# Patient Record
Sex: Male | Born: 1940 | Race: White | Hispanic: No | Marital: Married | Smoking: Never smoker
Health system: Southern US, Community
[De-identification: ages and names within clinical notes are randomized; demographics above are authoritative.]

## PROBLEM LIST (undated history)

## (undated) DIAGNOSIS — F0390 Unspecified dementia without behavioral disturbance: Secondary | ICD-10-CM

## (undated) DIAGNOSIS — R0602 Shortness of breath: Secondary | ICD-10-CM

## (undated) DIAGNOSIS — Z86718 Personal history of other venous thrombosis and embolism: Secondary | ICD-10-CM

## (undated) DIAGNOSIS — I4891 Unspecified atrial fibrillation: Secondary | ICD-10-CM

## (undated) DIAGNOSIS — K219 Gastro-esophageal reflux disease without esophagitis: Secondary | ICD-10-CM

## (undated) HISTORY — PX: HIP SURGERY: SHX245

## (undated) HISTORY — PX: APPENDECTOMY: SHX54

## (undated) HISTORY — PX: WRIST SURGERY: SHX841

## (undated) HISTORY — PX: KNEE SURGERY: SHX244

## (undated) HISTORY — PX: TONSILLECTOMY: SUR1361

## (undated) HISTORY — PX: HERNIA REPAIR: SHX51

---

## 2004-08-24 ENCOUNTER — Encounter: Admission: RE | Admit: 2004-08-24 | Discharge: 2004-08-24 | Payer: Self-pay | Admitting: Gastroenterology

## 2009-03-15 ENCOUNTER — Emergency Department (HOSPITAL_COMMUNITY)
Admission: EM | Admit: 2009-03-15 | Discharge: 2009-03-15 | Payer: Self-pay | Source: Home / Self Care | Admitting: Emergency Medicine

## 2009-03-16 ENCOUNTER — Encounter: Admission: RE | Admit: 2009-03-16 | Discharge: 2009-03-16 | Payer: Self-pay | Admitting: Orthopaedic Surgery

## 2009-03-19 ENCOUNTER — Encounter (INDEPENDENT_AMBULATORY_CARE_PROVIDER_SITE_OTHER): Payer: Self-pay | Admitting: Orthopaedic Surgery

## 2009-03-19 ENCOUNTER — Inpatient Hospital Stay (HOSPITAL_COMMUNITY): Admission: RE | Admit: 2009-03-19 | Discharge: 2009-03-20 | Payer: Self-pay | Admitting: Orthopaedic Surgery

## 2009-05-19 ENCOUNTER — Inpatient Hospital Stay (HOSPITAL_COMMUNITY): Admission: RE | Admit: 2009-05-19 | Discharge: 2009-05-22 | Payer: Self-pay | Admitting: Orthopaedic Surgery

## 2009-05-20 ENCOUNTER — Ambulatory Visit: Payer: Self-pay | Admitting: Internal Medicine

## 2009-06-25 ENCOUNTER — Encounter: Admission: RE | Admit: 2009-06-25 | Discharge: 2009-06-25 | Payer: Self-pay | Admitting: Orthopaedic Surgery

## 2009-07-05 ENCOUNTER — Encounter: Admission: RE | Admit: 2009-07-05 | Discharge: 2009-07-05 | Payer: Self-pay | Admitting: Gastroenterology

## 2009-09-16 ENCOUNTER — Encounter: Admission: RE | Admit: 2009-09-16 | Discharge: 2009-09-16 | Payer: Self-pay | Admitting: Orthopaedic Surgery

## 2009-10-05 ENCOUNTER — Observation Stay (HOSPITAL_COMMUNITY): Admission: AD | Admit: 2009-10-05 | Discharge: 2009-10-08 | Payer: Self-pay | Admitting: Orthopaedic Surgery

## 2009-10-08 ENCOUNTER — Ambulatory Visit: Payer: Self-pay | Admitting: Physical Medicine & Rehabilitation

## 2009-10-21 ENCOUNTER — Encounter: Admission: RE | Admit: 2009-10-21 | Discharge: 2010-01-19 | Payer: Self-pay | Admitting: Orthopaedic Surgery

## 2009-11-08 ENCOUNTER — Encounter: Admission: RE | Admit: 2009-11-08 | Discharge: 2009-11-08 | Payer: Self-pay | Admitting: Orthopaedic Surgery

## 2009-11-30 ENCOUNTER — Encounter: Admission: RE | Admit: 2009-11-30 | Discharge: 2009-11-30 | Payer: Self-pay | Admitting: Orthopaedic Surgery

## 2009-12-01 ENCOUNTER — Telehealth (INDEPENDENT_AMBULATORY_CARE_PROVIDER_SITE_OTHER): Payer: Self-pay | Admitting: *Deleted

## 2009-12-07 ENCOUNTER — Telehealth (INDEPENDENT_AMBULATORY_CARE_PROVIDER_SITE_OTHER): Payer: Self-pay | Admitting: *Deleted

## 2009-12-27 ENCOUNTER — Ambulatory Visit: Payer: Self-pay | Admitting: Psychiatry

## 2010-03-24 ENCOUNTER — Ambulatory Visit
Admission: RE | Admit: 2010-03-24 | Discharge: 2010-03-24 | Payer: Self-pay | Source: Home / Self Care | Attending: Infectious Diseases | Admitting: Infectious Diseases

## 2010-03-24 ENCOUNTER — Encounter: Payer: Self-pay | Admitting: Infectious Diseases

## 2010-03-24 DIAGNOSIS — T8489XA Other specified complication of internal orthopedic prosthetic devices, implants and grafts, initial encounter: Secondary | ICD-10-CM | POA: Insufficient documentation

## 2010-03-29 NOTE — Progress Notes (Signed)
  Request from Gabrielle Dare sent to [facility] [place]  December 01, 2009 2:04 PM

## 2010-03-29 NOTE — Progress Notes (Signed)
  Phone Note Other Incoming   Request: Send information Summary of Call: Request for records received from [employer]. Request forwarded to [facility].
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
## 2010-04-26 NOTE — Miscellaneous (Signed)
Summary: HIPAA Restrictions  HIPAA Restrictions   Imported By: Florinda Marker 04/22/2010 10:11:47  _____________________________________________________________________  External Attachment:    Type:   Image     Comment:   External Document

## 2010-05-10 ENCOUNTER — Telehealth: Payer: Self-pay | Admitting: Infectious Diseases

## 2010-05-12 ENCOUNTER — Encounter: Payer: Self-pay | Admitting: Infectious Diseases

## 2010-05-12 ENCOUNTER — Ambulatory Visit (INDEPENDENT_AMBULATORY_CARE_PROVIDER_SITE_OTHER): Payer: BC Managed Care – PPO | Admitting: Infectious Diseases

## 2010-05-12 DIAGNOSIS — T8489XA Other specified complication of internal orthopedic prosthetic devices, implants and grafts, initial encounter: Secondary | ICD-10-CM

## 2010-05-13 LAB — CBC
HCT: 40.8 % (ref 39.0–52.0)
Hemoglobin: 14 g/dL (ref 13.0–17.0)
RBC: 4.74 MIL/uL (ref 4.22–5.81)
WBC: 11.3 10*3/uL — ABNORMAL HIGH (ref 4.0–10.5)

## 2010-05-13 LAB — VITAMIN B12: Vitamin B-12: 386 pg/mL (ref 211–911)

## 2010-05-13 LAB — PROTIME-INR
INR: 2.34 — ABNORMAL HIGH (ref 0.00–1.49)
INR: 2.84 — ABNORMAL HIGH (ref 0.00–1.49)
INR: 2.94 — ABNORMAL HIGH (ref 0.00–1.49)
Prothrombin Time: 25.8 seconds — ABNORMAL HIGH (ref 11.6–15.2)
Prothrombin Time: 30.7 seconds — ABNORMAL HIGH (ref 11.6–15.2)
Prothrombin Time: 31.7 seconds — ABNORMAL HIGH (ref 11.6–15.2)

## 2010-05-13 LAB — COMPREHENSIVE METABOLIC PANEL
ALT: 21 U/L (ref 0–53)
Alkaline Phosphatase: 158 U/L — ABNORMAL HIGH (ref 39–117)
CO2: 23 mEq/L (ref 19–32)
Chloride: 100 mEq/L (ref 96–112)
GFR calc non Af Amer: >60 mL/min (ref >60)
Glucose, Bld: 109 mg/dL — ABNORMAL HIGH (ref 70–99)
Potassium: 3.6 mEq/L (ref 3.5–5.1)
Sodium: 135 mEq/L (ref 135–145)

## 2010-05-16 LAB — POCT I-STAT, CHEM 8
BUN: 22 mg/dL (ref 6–23)
BUN: 22 mg/dL (ref 6–23)
Calcium, Ion: 1.09 mmol/L — ABNORMAL LOW (ref 1.12–1.32)
Calcium, Ion: 1.12 mmol/L (ref 1.12–1.32)
Chloride: 108 meq/L (ref 96–112)
Chloride: 109 mEq/L (ref 96–112)
Creatinine, Ser: 0.6 mg/dL (ref 0.4–1.5)
Creatinine, Ser: 0.6 mg/dL (ref 0.4–1.5)
Glucose, Bld: 121 mg/dL — ABNORMAL HIGH (ref 70–99)
Glucose, Bld: 129 mg/dL — ABNORMAL HIGH (ref 70–99)
HCT: 46 % (ref 39.0–52.0)
HCT: 46 % (ref 39.0–52.0)
Hemoglobin: 15.6 g/dL (ref 13.0–17.0)
Hemoglobin: 15.6 g/dL (ref 13.0–17.0)
Potassium: 3.5 mEq/L (ref 3.5–5.1)
Potassium: 3.8 meq/L (ref 3.5–5.1)
Sodium: 138 meq/L (ref 135–145)
Sodium: 139 meq/L (ref 135–145)
TCO2: 22 mmol/L (ref 0–100)
TCO2: 23 mmol/L (ref 0–100)

## 2010-05-16 LAB — URINALYSIS, ROUTINE W REFLEX MICROSCOPIC
Bilirubin Urine: NEGATIVE
Glucose, UA: NEGATIVE mg/dL
Hgb urine dipstick: NEGATIVE
Specific Gravity, Urine: 1.022 (ref 1.005–1.030)

## 2010-05-16 LAB — CBC
HCT: 38.3 % — ABNORMAL LOW (ref 39.0–52.0)
Platelets: 199 10*3/uL (ref 150–400)
RDW: 13.1 % (ref 11.5–15.5)
WBC: 13.2 10*3/uL — ABNORMAL HIGH (ref 4.0–10.5)

## 2010-05-16 LAB — CROSSMATCH
ABO/RH(D): B POS
Antibody Screen: NEGATIVE

## 2010-05-16 LAB — SAMPLE TO BLOOD BANK

## 2010-05-16 LAB — COMPREHENSIVE METABOLIC PANEL
AST: 59 U/L — ABNORMAL HIGH (ref 0–37)
Albumin: 3.8 g/dL (ref 3.5–5.2)
Alkaline Phosphatase: 67 U/L (ref 39–117)
BUN: 25 mg/dL — ABNORMAL HIGH (ref 6–23)
Chloride: 100 mEq/L (ref 96–112)
Creatinine, Ser: 0.83 mg/dL (ref 0.4–1.5)
GFR calc Af Amer: >60 mL/min (ref >60)
Potassium: 3.9 mEq/L (ref 3.5–5.1)
Total Protein: 6.1 g/dL (ref 6.0–8.3)

## 2010-05-16 LAB — ETHANOL: Alcohol, Ethyl (B): <5 mg/dL (ref 0–10)

## 2010-05-16 LAB — ABO/RH: ABO/RH(D): B POS

## 2010-05-16 LAB — HEMOGLOBIN AND HEMATOCRIT, BLOOD
HCT: 29.2 % — ABNORMAL LOW (ref 39.0–52.0)
Hemoglobin: 10.5 g/dL — ABNORMAL LOW (ref 13.0–17.0)

## 2010-05-17 NOTE — Assessment & Plan Note (Signed)
Summary: 56month f/u   Vital Signs:  Patient profile:   70 year old male Height:      72 inches (182.88 cm) Weight:      211 pounds (95.91 kg) BMI:     28.72 Temp:     97.6 degrees F (36.44 degrees C) oral Pulse rate:   108 / minute BP sitting:   136 / 81  (left arm)  Vitals Entered By: Jennet Maduro RN (May 12, 2010 12:01 PM) CC: follow-up visit Is Patient Diabetic? No Pain Assessment Patient in pain? no      Nutritional Status BMI of 25 - 29 = overweight Nutritional Status Detail appetite "excellent"  Have you ever been in a relationship where you felt threatened, hurt or afraid?not asked, wife present   Does patient need assistance? Functional Status Self care Ambulation Wheelchair Comments uses walker in the home   CC:  follow-up visit.  History of Present Illness: Ricky Rodriguez is here in followup of management of his left hip persistent MRSA infection. See previous note for the long history of his left hip infection. He essentially has a Physiological scientist after destructive infection of left hip for which he was treated for 3 months. There was apparent clinically reoslution of the infection and 6-7 weeks ago had a surveillance aspirate and culture or the left hip space. No pus was evident cultures grew MRSA which was suceptible to quinolones. After conferring with Meidinger wife we decided to treat him withprolonged course of moxifloxacin 400mg  daily for at least three months. He has completed first month without incident. He has never had pain but he has felft generally better and gaine about 15 lbs. His [facility] orhtopedist, Dr. Ronn Melena, as I recall, called me and Mrs Walkowiak and felt that his planned debridement and spacer placement would be necessary for successful mangagement and felt that antibiotic Rx alone of any duration would not be curtive. Mrs. Flahive did not want to proceed with surgery because of the great exacerbation of Michaels dementia with anesthesia and  glady acccepted a medical approach. He is  without pain and is doing well so far. A recent sed rate in his primary care office was 69mm/hr and I have drawn a CRP today. Will plan to treat for 3 full more months through May, 2012. Patient and wife are agreeable. Will share my notes with Dr. Ophelia Charter.  Preventive Screening-Counseling & Management  Alcohol-Tobacco     Alcohol drinks/day: <1     Alcohol type: wine     Smoking Status: never  Caffeine-Diet-Exercise     Caffeine use/day: no     Does Patient Exercise: no     Type of exercise: using walker in the house  Safety-Violence-Falls     Seat Belt Use: yes  Allergies: No Known Drug Allergies  Physical Exam  General:  alert, well-developed, and well-nourished.   Neck:  No deformities, masses, or tenderness noted. Lungs:  Normal respiratory effort, chest expands symmetrically. Lungs are clear to auscultation, no crackles or wheezes. Extremities:  Left leg is slightly flexed on hip but has no discomfor on lmited rotation and extension.   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 Patient Level III (63875) Continue moxifloxacin 400mg  by mouth daily for at least 3 moremonths. CRP peneding.  Patient Instructions: 1)  Please schedule a follow-up appointment in 2 weeks.   Orders Added: 1)  T-C-Reactive Protein [[id number]] 2)  Est. Patient Level III [id number]

## 2010-05-17 NOTE — Progress Notes (Signed)
  Phone Note Call from Patient   Caller: Spouse Summary of Call: she left message that her spouse was seeing another md & getting labs. did not want to duplicate labs. we have no future orders in chart. I called back & LM asking her to have the other md fax the results here (gave her our fax #) the md here will not duplicate if he has had the labs we need Initial call taken by: Golden Circle RN,  May 10, 2010 11:37 AM  Follow-up for Phone Call        wife called back. pt gets pt/inr done & other md doses the coumadin. told her we do not need to have those sent here. they did a ded rate & she will have that sent here. Dr. Maurice March will presumably order other labs at visit this week Follow-up by: Golden Circle RN,  May 10, 2010 2:10 PM

## 2010-05-23 LAB — ANAEROBIC CULTURE

## 2010-05-23 LAB — TISSUE CULTURE: Culture: NO GROWTH

## 2010-05-23 LAB — BASIC METABOLIC PANEL
CO2: 26 mEq/L (ref 19–32)
Calcium: 7.9 mg/dL — ABNORMAL LOW (ref 8.4–10.5)
Creatinine, Ser: 0.76 mg/dL (ref 0.4–1.5)
GFR calc Af Amer: >60 mL/min (ref >60)
GFR calc non Af Amer: >60 mL/min (ref >60)
Glucose, Bld: 121 mg/dL — ABNORMAL HIGH (ref 70–99)
Sodium: 135 mEq/L (ref 135–145)

## 2010-05-23 LAB — COMPREHENSIVE METABOLIC PANEL
AST: 16 U/L (ref 0–37)
Albumin: 3.5 g/dL (ref 3.5–5.2)
CO2: 23 mEq/L (ref 19–32)
Calcium: 9.3 mg/dL (ref 8.4–10.5)
Creatinine, Ser: 0.76 mg/dL (ref 0.4–1.5)
GFR calc Af Amer: >60 mL/min (ref >60)
GFR calc non Af Amer: >60 mL/min (ref >60)
Total Protein: 6.9 g/dL (ref 6.0–8.3)

## 2010-05-23 LAB — URINALYSIS, ROUTINE W REFLEX MICROSCOPIC
Bilirubin Urine: NEGATIVE
Glucose, UA: NEGATIVE mg/dL
Nitrite: NEGATIVE
Specific Gravity, Urine: 1.018 (ref 1.005–1.030)
pH: 7.5 (ref 5.0–8.0)

## 2010-05-23 LAB — CBC
Hemoglobin: 8.7 g/dL — ABNORMAL LOW (ref 13.0–17.0)
MCHC: 32.9 g/dL (ref 30.0–36.0)
MCHC: 33.4 g/dL (ref 30.0–36.0)
MCV: 90.2 fL (ref 78.0–100.0)
Platelets: 274 10*3/uL (ref 150–400)
Platelets: 382 10*3/uL (ref 150–400)
RDW: 15.1 % (ref 11.5–15.5)
RDW: 15.1 % (ref 11.5–15.5)
RDW: 15.2 % (ref 11.5–15.5)
WBC: 7.3 10*3/uL (ref 4.0–10.5)

## 2010-05-23 LAB — PROTIME-INR
INR: 1.45 (ref 0.00–1.49)
INR: 1.76 — ABNORMAL HIGH (ref 0.00–1.49)
Prothrombin Time: 16.5 seconds — ABNORMAL HIGH (ref 11.6–15.2)
Prothrombin Time: 17.5 seconds — ABNORMAL HIGH (ref 11.6–15.2)
Prothrombin Time: 20.4 seconds — ABNORMAL HIGH (ref 11.6–15.2)
Prothrombin Time: 27.9 seconds — ABNORMAL HIGH (ref 11.6–15.2)

## 2010-05-23 LAB — VANCOMYCIN, TROUGH: Vancomycin Tr: 8.5 ug/mL — ABNORMAL LOW (ref 10.0–20.0)

## 2010-06-16 ENCOUNTER — Encounter: Payer: Self-pay | Admitting: Infectious Diseases

## 2010-06-16 ENCOUNTER — Ambulatory Visit (INDEPENDENT_AMBULATORY_CARE_PROVIDER_SITE_OTHER): Payer: BC Managed Care – PPO | Admitting: Infectious Diseases

## 2010-06-16 VITALS — BP 146/90 | HR 101 | Temp 97.8°F | Ht 72.0 in | Wt 220.0 lb

## 2010-06-16 DIAGNOSIS — M542 Cervicalgia: Secondary | ICD-10-CM

## 2010-06-16 MED ORDER — ALPRAZOLAM 0.5 MG PO TABS: 0.5000 mg | ORAL_TABLET | Freq: Three times a day (TID) | ORAL | Status: DC | PRN
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
## 2010-11-03 ENCOUNTER — Ambulatory Visit: Payer: BC Managed Care – PPO | Admitting: Infectious Diseases

## 2011-02-02 ENCOUNTER — Encounter (HOSPITAL_BASED_OUTPATIENT_CLINIC_OR_DEPARTMENT_OTHER): Payer: BC Managed Care – PPO | Attending: Internal Medicine

## 2011-02-02 DIAGNOSIS — Z79899 Other long term (current) drug therapy: Secondary | ICD-10-CM | POA: Insufficient documentation

## 2011-02-02 DIAGNOSIS — L89309 Pressure ulcer of unspecified buttock, unspecified stage: Secondary | ICD-10-CM | POA: Insufficient documentation

## 2011-02-02 DIAGNOSIS — I10 Essential (primary) hypertension: Secondary | ICD-10-CM | POA: Insufficient documentation

## 2011-02-02 DIAGNOSIS — M159 Polyosteoarthritis, unspecified: Secondary | ICD-10-CM | POA: Insufficient documentation

## 2011-02-02 DIAGNOSIS — K219 Gastro-esophageal reflux disease without esophagitis: Secondary | ICD-10-CM | POA: Insufficient documentation

## 2011-02-02 DIAGNOSIS — Z7901 Long term (current) use of anticoagulants: Secondary | ICD-10-CM | POA: Insufficient documentation

## 2011-02-02 DIAGNOSIS — L8990 Pressure ulcer of unspecified site, unspecified stage: Secondary | ICD-10-CM | POA: Insufficient documentation

## 2011-02-02 DIAGNOSIS — Z86718 Personal history of other venous thrombosis and embolism: Secondary | ICD-10-CM | POA: Insufficient documentation

## 2011-02-02 DIAGNOSIS — R259 Unspecified abnormal involuntary movements: Secondary | ICD-10-CM | POA: Insufficient documentation
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
## 2011-03-02 ENCOUNTER — Encounter (HOSPITAL_BASED_OUTPATIENT_CLINIC_OR_DEPARTMENT_OTHER): Payer: BC Managed Care – PPO | Attending: Internal Medicine

## 2011-03-02 DIAGNOSIS — L89309 Pressure ulcer of unspecified buttock, unspecified stage: Secondary | ICD-10-CM | POA: Insufficient documentation

## 2011-03-02 DIAGNOSIS — L8990 Pressure ulcer of unspecified site, unspecified stage: Secondary | ICD-10-CM | POA: Insufficient documentation

## 2011-03-02 DIAGNOSIS — Z7901 Long term (current) use of anticoagulants: Secondary | ICD-10-CM | POA: Insufficient documentation

## 2011-03-02 DIAGNOSIS — I10 Essential (primary) hypertension: Secondary | ICD-10-CM | POA: Insufficient documentation

## 2011-03-02 DIAGNOSIS — M159 Polyosteoarthritis, unspecified: Secondary | ICD-10-CM | POA: Insufficient documentation

## 2011-03-02 DIAGNOSIS — Z79899 Other long term (current) drug therapy: Secondary | ICD-10-CM | POA: Insufficient documentation

## 2011-03-02 DIAGNOSIS — R259 Unspecified abnormal involuntary movements: Secondary | ICD-10-CM | POA: Insufficient documentation

## 2011-03-02 DIAGNOSIS — K219 Gastro-esophageal reflux disease without esophagitis: Secondary | ICD-10-CM | POA: Insufficient documentation

## 2011-03-02 DIAGNOSIS — Z86718 Personal history of other venous thrombosis and embolism: Secondary | ICD-10-CM | POA: Insufficient documentation

## 2011-05-11 DIAGNOSIS — M1610 Unilateral primary osteoarthritis, unspecified hip: Secondary | ICD-10-CM | POA: Insufficient documentation

## 2011-05-19 DIAGNOSIS — M00859 Arthritis due to other bacteria, unspecified hip: Secondary | ICD-10-CM | POA: Insufficient documentation

## 2011-06-23 ENCOUNTER — Ambulatory Visit: Payer: BC Managed Care – PPO | Attending: Internal Medicine | Admitting: Physical Therapy

## 2011-06-23 DIAGNOSIS — IMO0001 Reserved for inherently not codable concepts without codable children: Secondary | ICD-10-CM | POA: Insufficient documentation

## 2011-06-23 DIAGNOSIS — M6281 Muscle weakness (generalized): Secondary | ICD-10-CM | POA: Insufficient documentation

## 2011-06-23 DIAGNOSIS — M25669 Stiffness of unspecified knee, not elsewhere classified: Secondary | ICD-10-CM | POA: Insufficient documentation

## 2011-06-23 DIAGNOSIS — R262 Difficulty in walking, not elsewhere classified: Secondary | ICD-10-CM | POA: Insufficient documentation

## 2011-06-27 ENCOUNTER — Ambulatory Visit: Payer: BC Managed Care – PPO | Admitting: Physical Therapy

## 2011-06-30 ENCOUNTER — Ambulatory Visit: Payer: BC Managed Care – PPO | Attending: Internal Medicine | Admitting: Physical Therapy

## 2011-06-30 DIAGNOSIS — IMO0001 Reserved for inherently not codable concepts without codable children: Secondary | ICD-10-CM | POA: Insufficient documentation

## 2011-06-30 DIAGNOSIS — M25669 Stiffness of unspecified knee, not elsewhere classified: Secondary | ICD-10-CM | POA: Insufficient documentation

## 2011-06-30 DIAGNOSIS — M6281 Muscle weakness (generalized): Secondary | ICD-10-CM | POA: Insufficient documentation

## 2011-06-30 DIAGNOSIS — R262 Difficulty in walking, not elsewhere classified: Secondary | ICD-10-CM | POA: Insufficient documentation

## 2011-07-03 ENCOUNTER — Ambulatory Visit: Payer: BC Managed Care – PPO | Admitting: Physical Therapy

## 2011-07-05 ENCOUNTER — Ambulatory Visit: Payer: BC Managed Care – PPO | Admitting: Physical Therapy

## 2011-07-10 ENCOUNTER — Ambulatory Visit: Payer: BC Managed Care – PPO | Admitting: Physical Therapy

## 2011-07-12 ENCOUNTER — Ambulatory Visit: Payer: BC Managed Care – PPO | Admitting: Physical Therapy

## 2011-07-17 ENCOUNTER — Ambulatory Visit: Payer: BC Managed Care – PPO | Admitting: Physical Therapy

## 2011-07-19 ENCOUNTER — Ambulatory Visit: Payer: BC Managed Care – PPO | Admitting: Physical Therapy

## 2011-07-25 ENCOUNTER — Ambulatory Visit: Payer: BC Managed Care – PPO | Admitting: Physical Therapy

## 2011-07-27 ENCOUNTER — Ambulatory Visit: Payer: BC Managed Care – PPO | Admitting: Physical Therapy

## 2011-07-31 ENCOUNTER — Ambulatory Visit: Payer: BC Managed Care – PPO | Attending: Internal Medicine | Admitting: Physical Therapy

## 2011-07-31 DIAGNOSIS — M25669 Stiffness of unspecified knee, not elsewhere classified: Secondary | ICD-10-CM | POA: Insufficient documentation

## 2011-07-31 DIAGNOSIS — IMO0001 Reserved for inherently not codable concepts without codable children: Secondary | ICD-10-CM | POA: Insufficient documentation

## 2011-07-31 DIAGNOSIS — M6281 Muscle weakness (generalized): Secondary | ICD-10-CM | POA: Insufficient documentation

## 2011-07-31 DIAGNOSIS — R262 Difficulty in walking, not elsewhere classified: Secondary | ICD-10-CM | POA: Insufficient documentation

## 2011-08-03 ENCOUNTER — Ambulatory Visit: Payer: BC Managed Care – PPO | Admitting: Physical Therapy

## 2011-08-07 ENCOUNTER — Ambulatory Visit: Payer: BC Managed Care – PPO | Admitting: Physical Therapy

## 2011-08-09 ENCOUNTER — Ambulatory Visit: Payer: BC Managed Care – PPO | Admitting: Physical Therapy

## 2011-08-16 ENCOUNTER — Ambulatory Visit: Payer: BC Managed Care – PPO | Admitting: Physical Therapy

## 2011-08-17 ENCOUNTER — Ambulatory Visit: Payer: BC Managed Care – PPO | Admitting: Physical Therapy

## 2011-08-24 ENCOUNTER — Ambulatory Visit: Payer: BC Managed Care – PPO | Admitting: Physical Therapy

## 2011-08-25 ENCOUNTER — Ambulatory Visit: Payer: BC Managed Care – PPO | Admitting: Physical Therapy

## 2011-08-29 ENCOUNTER — Ambulatory Visit: Payer: BC Managed Care – PPO | Attending: Internal Medicine | Admitting: Physical Therapy

## 2011-08-29 DIAGNOSIS — M25669 Stiffness of unspecified knee, not elsewhere classified: Secondary | ICD-10-CM | POA: Insufficient documentation

## 2011-08-29 DIAGNOSIS — M6281 Muscle weakness (generalized): Secondary | ICD-10-CM | POA: Insufficient documentation

## 2011-08-29 DIAGNOSIS — IMO0001 Reserved for inherently not codable concepts without codable children: Secondary | ICD-10-CM | POA: Insufficient documentation

## 2011-08-29 DIAGNOSIS — R262 Difficulty in walking, not elsewhere classified: Secondary | ICD-10-CM | POA: Insufficient documentation

## 2011-09-01 ENCOUNTER — Ambulatory Visit: Payer: BC Managed Care – PPO | Admitting: Physical Therapy

## 2011-09-05 ENCOUNTER — Ambulatory Visit: Payer: BC Managed Care – PPO | Admitting: Physical Therapy

## 2011-09-07 ENCOUNTER — Ambulatory Visit: Payer: BC Managed Care – PPO | Admitting: Physical Therapy

## 2011-09-15 ENCOUNTER — Ambulatory Visit: Payer: BC Managed Care – PPO | Admitting: Physical Therapy

## 2011-09-16 ENCOUNTER — Emergency Department (HOSPITAL_BASED_OUTPATIENT_CLINIC_OR_DEPARTMENT_OTHER): Payer: BC Managed Care – PPO

## 2011-09-16 ENCOUNTER — Inpatient Hospital Stay (HOSPITAL_BASED_OUTPATIENT_CLINIC_OR_DEPARTMENT_OTHER)
Admission: EM | Admit: 2011-09-16 | Discharge: 2011-09-26 | DRG: 558 | Disposition: A | Discharge status: Skilled Nursing Facility | Payer: BC Managed Care – PPO | Attending: Gastroenterology | Admitting: Gastroenterology

## 2011-09-16 ENCOUNTER — Encounter (HOSPITAL_BASED_OUTPATIENT_CLINIC_OR_DEPARTMENT_OTHER): Payer: Self-pay | Admitting: *Deleted

## 2011-09-16 DIAGNOSIS — Y831 Surgical operation with implant of artificial internal device as the cause of abnormal reaction of the patient, or of later complication, without mention of misadventure at the time of the procedure: Secondary | ICD-10-CM | POA: Diagnosis present

## 2011-09-16 DIAGNOSIS — G9341 Metabolic encephalopathy: Secondary | ICD-10-CM

## 2011-09-16 DIAGNOSIS — Z66 Do not resuscitate: Secondary | ICD-10-CM | POA: Diagnosis present

## 2011-09-16 DIAGNOSIS — I82509 Chronic embolism and thrombosis of unspecified deep veins of unspecified lower extremity: Secondary | ICD-10-CM | POA: Diagnosis present

## 2011-09-16 DIAGNOSIS — I10 Essential (primary) hypertension: Secondary | ICD-10-CM | POA: Diagnosis present

## 2011-09-16 DIAGNOSIS — I4891 Unspecified atrial fibrillation: Secondary | ICD-10-CM

## 2011-09-16 DIAGNOSIS — Y92009 Unspecified place in unspecified non-institutional (private) residence as the place of occurrence of the external cause: Secondary | ICD-10-CM

## 2011-09-16 DIAGNOSIS — E7800 Pure hypercholesterolemia, unspecified: Secondary | ICD-10-CM | POA: Diagnosis present

## 2011-09-16 DIAGNOSIS — I82403 Acute embolism and thrombosis of unspecified deep veins of lower extremity, bilateral: Secondary | ICD-10-CM | POA: Diagnosis present

## 2011-09-16 DIAGNOSIS — Z96649 Presence of unspecified artificial hip joint: Secondary | ICD-10-CM

## 2011-09-16 DIAGNOSIS — A419 Sepsis, unspecified organism: Secondary | ICD-10-CM | POA: Diagnosis present

## 2011-09-16 DIAGNOSIS — F0390 Unspecified dementia without behavioral disturbance: Secondary | ICD-10-CM | POA: Diagnosis present

## 2011-09-16 DIAGNOSIS — T8450XA Infection and inflammatory reaction due to unspecified internal joint prosthesis, initial encounter: Principal | ICD-10-CM | POA: Diagnosis present

## 2011-09-16 DIAGNOSIS — J189 Pneumonia, unspecified organism: Secondary | ICD-10-CM | POA: Diagnosis present

## 2011-09-16 DIAGNOSIS — E876 Hypokalemia: Secondary | ICD-10-CM

## 2011-09-16 DIAGNOSIS — I214 Non-ST elevation (NSTEMI) myocardial infarction: Secondary | ICD-10-CM

## 2011-09-16 DIAGNOSIS — Z7901 Long term (current) use of anticoagulants: Secondary | ICD-10-CM

## 2011-09-16 DIAGNOSIS — R509 Fever, unspecified: Secondary | ICD-10-CM

## 2011-09-16 DIAGNOSIS — T8452XA Infection and inflammatory reaction due to internal left hip prosthesis, initial encounter: Secondary | ICD-10-CM | POA: Diagnosis present

## 2011-09-16 DIAGNOSIS — K219 Gastro-esophageal reflux disease without esophagitis: Secondary | ICD-10-CM | POA: Diagnosis present

## 2011-09-16 HISTORY — DX: Gastro-esophageal reflux disease without esophagitis: K21.9

## 2011-09-16 HISTORY — DX: Unspecified dementia, unspecified severity, without behavioral disturbance, psychotic disturbance, mood disturbance, and anxiety: F03.90

## 2011-09-16 HISTORY — DX: Personal history of other venous thrombosis and embolism: Z86.718

## 2011-09-16 HISTORY — DX: Shortness of breath: R06.02

## 2011-09-16 HISTORY — DX: Unspecified atrial fibrillation: I48.91

## 2011-09-16 LAB — CBC WITH DIFFERENTIAL/PLATELET
Basophils Absolute: 0 10*3/uL (ref 0.0–0.1)
Eosinophils Absolute: 0 10*3/uL (ref 0.0–0.7)
Eosinophils Relative: 0 % (ref 0–5)
MCH: 29.7 pg (ref 26.0–34.0)
MCHC: 35.1 g/dL (ref 30.0–36.0)
MCV: 84.6 fL (ref 78.0–100.0)
Metamyelocytes Relative: 0 %
Myelocytes: 0 %
Platelets: 343 10*3/uL (ref 150–400)
Promyelocytes Absolute: 0 %
RBC: 4.75 MIL/uL (ref 4.22–5.81)
nRBC: 0 /100 WBC

## 2011-09-16 LAB — BASIC METABOLIC PANEL
BUN: 18 mg/dL (ref 6–23)
CO2: 22 mEq/L (ref 19–32)
Calcium: 9.1 mg/dL (ref 8.4–10.5)
Creatinine, Ser: 1 mg/dL (ref 0.50–1.35)
GFR calc non Af Amer: 74 mL/min — ABNORMAL LOW (ref >90)
Glucose, Bld: 194 mg/dL — ABNORMAL HIGH (ref 70–99)
Sodium: 132 mEq/L — ABNORMAL LOW (ref 135–145)

## 2011-09-16 LAB — URINALYSIS, ROUTINE W REFLEX MICROSCOPIC
Leukocytes, UA: NEGATIVE
Protein, ur: NEGATIVE mg/dL
Urobilinogen, UA: 0.2 mg/dL (ref 0.0–1.0)

## 2011-09-16 LAB — PROTIME-INR
INR: 2.2 — ABNORMAL HIGH (ref 0.00–1.49)
Prothrombin Time: 24.8 seconds — ABNORMAL HIGH (ref 11.6–15.2)

## 2011-09-16 LAB — MRSA PCR SCREENING: MRSA by PCR: NEGATIVE

## 2011-09-16 LAB — LACTIC ACID, PLASMA: Lactic Acid, Venous: 3.1 mmol/L — ABNORMAL HIGH (ref 0.5–2.2)

## 2011-09-16 MED ORDER — VANCOMYCIN HCL IN DEXTROSE 1-5 GM/200ML-% IV SOLN
1000.0000 mg | Freq: Once | INTRAVENOUS | Status: AC
  Administered 2011-09-16 (×2): 1000 mg via INTRAVENOUS
  Filled 2011-09-16: qty 200

## 2011-09-16 MED ORDER — IBUPROFEN 400 MG PO TABS
ORAL_TABLET | ORAL | Status: AC
  Administered 2011-09-16: 400 mg via ORAL
  Filled 2011-09-16: qty 1

## 2011-09-16 MED ORDER — B COMPLEX-C PO TABS
1.0000 | ORAL_TABLET | Freq: Every day | ORAL | Status: DC
  Administered 2011-09-17 – 2011-09-26 (×10): 1 via ORAL
  Filled 2011-09-16 (×11): qty 1

## 2011-09-16 MED ORDER — ARIPIPRAZOLE 2 MG PO TABS
2.0000 mg | ORAL_TABLET | Freq: Every day | ORAL | Status: DC
  Filled 2011-09-16: qty 1

## 2011-09-16 MED ORDER — B COMPLEX VITAMINS PO CAPS: 1.0000 | ORAL_CAPSULE | ORAL | Status: DC

## 2011-09-16 MED ORDER — PIPERACILLIN-TAZOBACTAM 3.375 G IVPB
3.3750 g | Freq: Three times a day (TID) | INTRAVENOUS | Status: DC
  Administered 2011-09-16 – 2011-09-21 (×14): 3.375 g via INTRAVENOUS
  Filled 2011-09-16 (×18): qty 50

## 2011-09-16 MED ORDER — POTASSIUM CHLORIDE ER 8 MEQ PO TBCR
8.0000 meq | EXTENDED_RELEASE_TABLET | Freq: Two times a day (BID) | ORAL | Status: DC
  Administered 2011-09-16 – 2011-09-26 (×20): 8 meq via ORAL
  Filled 2011-09-16 (×22): qty 1

## 2011-09-16 MED ORDER — ALBUTEROL SULFATE (5 MG/ML) 0.5% IN NEBU: 2.5000 mg | INHALATION_SOLUTION | RESPIRATORY_TRACT | Status: DC | PRN

## 2011-09-16 MED ORDER — SODIUM CHLORIDE 0.9 % IV SOLN
INTRAVENOUS | Status: DC
  Administered 2011-09-17: 01:00:00 via INTRAVENOUS

## 2011-09-16 MED ORDER — VITAMIN D3 10 MCG (400 UNIT) PO CAPS: 1.0000 | ORAL_CAPSULE | ORAL | Status: DC

## 2011-09-16 MED ORDER — HYDROCHLOROTHIAZIDE 50 MG PO TABS
50.0000 mg | ORAL_TABLET | Freq: Every day | ORAL | Status: DC
  Administered 2011-09-17 – 2011-09-19 (×3): 50 mg via ORAL
  Filled 2011-09-16 (×4): qty 1

## 2011-09-16 MED ORDER — ACETAMINOPHEN 650 MG RE SUPP: 650.0000 mg | Freq: Four times a day (QID) | RECTAL | Status: DC | PRN

## 2011-09-16 MED ORDER — MORPHINE SULFATE 2 MG/ML IJ SOLN: 2.0000 mg | INTRAMUSCULAR | Status: DC | PRN

## 2011-09-16 MED ORDER — PANTOPRAZOLE SODIUM 20 MG PO TBEC
20.0000 mg | DELAYED_RELEASE_TABLET | Freq: Every day | ORAL | Status: DC
  Administered 2011-09-17 – 2011-09-26 (×10): 20 mg via ORAL
  Filled 2011-09-16 (×10): qty 1

## 2011-09-16 MED ORDER — ONDANSETRON HCL 4 MG PO TABS: 4.0000 mg | ORAL_TABLET | Freq: Four times a day (QID) | ORAL | Status: DC | PRN

## 2011-09-16 MED ORDER — VANCOMYCIN HCL IN DEXTROSE 1-5 GM/200ML-% IV SOLN
1000.0000 mg | Freq: Two times a day (BID) | INTRAVENOUS | Status: DC
  Administered 2011-09-17 – 2011-09-20 (×8): 1000 mg via INTRAVENOUS
  Filled 2011-09-16 (×9): qty 200

## 2011-09-16 MED ORDER — ACETAMINOPHEN 325 MG PO TABS
650.0000 mg | ORAL_TABLET | Freq: Four times a day (QID) | ORAL | Status: DC | PRN
  Administered 2011-09-17 – 2011-09-24 (×4): 650 mg via ORAL
  Filled 2011-09-16 (×4): qty 2

## 2011-09-16 MED ORDER — SODIUM CHLORIDE 0.9 % IJ SOLN
3.0000 mL | Freq: Two times a day (BID) | INTRAMUSCULAR | Status: DC
  Administered 2011-09-16 – 2011-09-24 (×8): 3 mL via INTRAVENOUS

## 2011-09-16 MED ORDER — WARFARIN - PHARMACIST DOSING INPATIENT: Freq: Every day | Status: DC

## 2011-09-16 MED ORDER — FERROUS SULFATE 325 (65 FE) MG PO TABS
325.0000 mg | ORAL_TABLET | Freq: Every day | ORAL | Status: DC
  Administered 2011-09-17 – 2011-09-20 (×4): 325 mg via ORAL
  Filled 2011-09-16 (×6): qty 1

## 2011-09-16 MED ORDER — SODIUM CHLORIDE 0.9 % IV BOLUS (SEPSIS)
1000.0000 mL | Freq: Once | INTRAVENOUS | Status: AC
  Administered 2011-09-16: 1000 mL via INTRAVENOUS

## 2011-09-16 MED ORDER — IBUPROFEN 400 MG PO TABS
400.0000 mg | ORAL_TABLET | Freq: Once | ORAL | Status: AC
  Administered 2011-09-16: 400 mg via ORAL

## 2011-09-16 MED ORDER — PIPERACILLIN-TAZOBACTAM 3.375 G IVPB
3.3750 g | Freq: Once | INTRAVENOUS | Status: AC
  Administered 2011-09-16: 3.375 g via INTRAVENOUS
  Filled 2011-09-16: qty 50

## 2011-09-16 MED ORDER — ONDANSETRON HCL 4 MG/2ML IJ SOLN
4.0000 mg | Freq: Four times a day (QID) | INTRAMUSCULAR | Status: DC | PRN
  Filled 2011-09-16: qty 2

## 2011-09-16 MED ORDER — CHOLECALCIFEROL 10 MCG (400 UNIT) PO TABS
400.0000 [IU] | ORAL_TABLET | Freq: Every day | ORAL | Status: DC
  Administered 2011-09-17 – 2011-09-26 (×10): 400 [IU] via ORAL
  Filled 2011-09-16 (×10): qty 1

## 2011-09-16 MED ORDER — VITAMIN C 500 MG PO TABS
500.0000 mg | ORAL_TABLET | Freq: Every day | ORAL | Status: DC
  Administered 2011-09-17 – 2011-09-26 (×10): 500 mg via ORAL
  Filled 2011-09-16 (×10): qty 1

## 2011-09-16 MED ORDER — ACETAMINOPHEN 325 MG PO TABS
650.0000 mg | ORAL_TABLET | Freq: Once | ORAL | Status: AC
  Administered 2011-09-16: 650 mg via ORAL
  Filled 2011-09-16: qty 2

## 2011-09-16 MED ORDER — WARFARIN SODIUM 3 MG PO TABS
3.0000 mg | ORAL_TABLET | Freq: Every day | ORAL | Status: DC
  Administered 2011-09-16: 3 mg via ORAL
  Filled 2011-09-16 (×3): qty 1

## 2011-09-16 NOTE — ED Provider Notes (Signed)
History   This chart was scribed for Ricky Bucco, MD scribed by Magnus Sinning. The patient was seen in room MH01/MH01 seen at 15:05   CSN: [id number]  Arrival date & time 09/16/11  1436   First MD Initiated Contact with Patient 09/16/11 1503      Chief Complaint  Patient presents with  . Altered Mental Status    (Consider location/radiation/quality/duration/timing/severity/associated sxs/prior treatment) Patient is a 71 y.o. male presenting with altered mental status. The history is provided by the spouse. No language interpreter was used.  Altered Mental Status Associated symptoms include headaches and shortness of breath. Pertinent negatives include no chest pain.   Ricky TRAUGER is a 71 y.o. male who presents to the Emergency Department complaining of altered mental status, onset last night. The wife states the patient has had more confusion than normal starting late yesterday evening. Patient has a history of dementia, but she reports at baseline he is still responsive. Wife also reports that patient has had ambulatory difficulties that she attributes to right knee "buckling,"  generalized weakness, difficulty breathing, cough, HA ,and fever that all began last night. Patient also reportedly became incontinent this morning. She explains that the patient began feeling poorly late last night, but states prior in the day he was fine during physical therapy with the exception of mild SOB from the therapy. Denies n/v/d, rash, runny nose, but does states the patient has had occasional congestion in the morning during breakfast.Patient has a history of left hip reconstruction that occurred approximately 5 months ago which she states has caused several infections. Patient currently takes Abilify, uses hearing aids bilaterally, and is seen by Danise Edge at [facility]. Past Medical History  Diagnosis Date  . Dementia     Past Surgical History  Procedure Date  . Hip surgery   .  Knee surgery   . Tonsillectomy   . Appendectomy   . Hernia repair     History reviewed. No pertinent family history.  History  Substance Use Topics  . Smoking status: Never Smoker   . Smokeless tobacco: Never Used  . Alcohol Use: Yes     occasional     Provided by spouse Review of Systems  Unable to perform ROS: Mental status change  Constitutional: Positive for fever and diaphoresis. Negative for fatigue.  HENT: Positive for congestion. Negative for rhinorrhea and sneezing.   Eyes: Negative.   Respiratory: Positive for cough and shortness of breath.   Cardiovascular: Negative for chest pain and leg swelling.  Gastrointestinal: Negative for nausea, vomiting and diarrhea.  Genitourinary:       Positive for incontinence  Musculoskeletal: Positive for gait problem. Negative for myalgias.  Skin: Negative for rash.  Neurological: Positive for speech difficulty, weakness and headaches. Negative for numbness.  Psychiatric/Behavioral: Positive for confusion and altered mental status.   Allergies  Review of patient's allergies indicates no known allergies.  Home Medications   Current Outpatient Rx  Name Route Sig Dispense Refill  . ARIPIPRAZOLE 2 MG PO TABS Oral Take 2 mg by mouth at bedtime. Patient uses 3 milligrams of this medication.    Marland Kitchen VITAMIN C 500 MG PO TABS Oral Take 500 mg by mouth every morning.     . B COMPLEX VITAMINS PO CAPS Oral Take 1 capsule by mouth every morning.      Marland Kitchen VITAMIN D3 400 UNITS PO CAPS Oral Take 1 capsule by mouth every morning.      Marland Kitchen FERROUS SULFATE 325 (  65 FE) MG PO TABS Oral Take 325 mg by mouth daily with breakfast.    . HYDROCHLOROTHIAZIDE 50 MG PO TABS Oral Take 50 mg by mouth daily.    Marland Kitchen LANSOPRAZOLE 30 MG PO CPDR Oral Take 30 mg by mouth every morning.     Marland Kitchen POTASSIUM CHLORIDE ER 8 MEQ PO TBCR Oral Take 8 mEq by mouth 2 (two) times daily.    . WARFARIN SODIUM 3 MG PO TABS Oral Take 3 mg by mouth daily.      BP 146/69  Pulse 126   Temp 102.7 F (39.3 C) (Oral)  Resp 32  Ht 6' (1.829 m)  Wt 200 lb (90.719 kg)  BMI 27.12 kg/m2  SpO2 92%  Physical Exam  Nursing note and vitals reviewed. Constitutional: He appears well-developed and well-nourished. No distress.  HENT:  Head: Normocephalic and atraumatic.  Eyes: EOM are normal. Pupils are equal, round, and reactive to light.  Neck: Neck supple. No tracheal deviation present.  Cardiovascular: Tachycardia present.   Pulmonary/Chest: Effort normal. No respiratory distress. He has rales.       Crackles in right lung base  Abdominal: Soft. He exhibits no distension.  Musculoskeletal: Normal range of motion. He exhibits no edema.  Neurological: He is alert. No sensory deficit.       Confused but alert. Had difficulty getting words out. Moves all extremities symmetrically.  No obv facial droop. Unable to test sensation due to mental status.   Skin: Skin is warm and dry.  Psychiatric: He has a normal mood and affect. His behavior is normal.    ED Course  Procedures (including critical care time) DIAGNOSTIC STUDIES: Oxygen Saturation is 92% on room air, adequate by my interpretation.    COORDINATION OF CARE:  Results for orders placed during the hospital encounter of 09/16/11  BASIC METABOLIC PANEL      Component Value Range   Sodium 132 (*) 135 - 145 mEq/L   Potassium 3.1 (*) 3.5 - 5.1 mEq/L   Chloride 94 (*) 96 - 112 mEq/L   CO2 22  19 - 32 mEq/L   Glucose, Bld 194 (*) 70 - 99 mg/dL   BUN 18  6 - 23 mg/dL   Creatinine, Ser 1.61  0.50 - 1.35 mg/dL   Calcium 9.1  8.4 - 09.6 mg/dL   GFR calc non Af Amer 74 (*) >90 mL/min   GFR calc Af Amer 85 (*) >90 mL/min  CBC WITH DIFFERENTIAL      Component Value Range   WBC 27.3 (*) 4.0 - 10.5 K/uL   RBC 4.75  4.22 - 5.81 MIL/uL   Hemoglobin 14.1  13.0 - 17.0 g/dL   HCT 04.5  40.9 - 81.1 %   MCV 84.6  78.0 - 100.0 fL   MCH 29.7  26.0 - 34.0 pg   MCHC 35.1  30.0 - 36.0 g/dL   RDW 91.4  78.2 - 95.6 %   Platelets 343   150 - 400 K/uL   Neutrophils Relative 98 (*) 43 - 77 %   Lymphocytes Relative 0 (*) 12 - 46 %   Monocytes Relative 2 (*) 3 - 12 %   Eosinophils Relative 0  0 - 5 %   Basophils Relative 0  0 - 1 %   Band Neutrophils 0  0 - 10 %   Metamyelocytes Relative 0     Myelocytes 0     Promyelocytes Absolute 0     Blasts 0  nRBC 0  0 /100 WBC   Neutro Abs 26.8 (*) 1.7 - 7.7 K/uL   Lymphs Abs 0.0 (*) 0.7 - 4.0 K/uL   Monocytes Absolute 0.5  0.1 - 1.0 K/uL   Eosinophils Absolute 0.0  0.0 - 0.7 K/uL   Basophils Absolute 0.0  0.0 - 0.1 K/uL   Smear Review MORPHOLOGY UNREMARKABLE    LACTIC ACID, PLASMA      Component Value Range   Lactic Acid, Venous 3.1 (*) 0.5 - 2.2 mmol/L  URINALYSIS, ROUTINE W REFLEX MICROSCOPIC      Component Value Range   Color, Urine YELLOW  YELLOW   APPearance CLEAR  CLEAR   Specific Gravity, Urine 1.021  1.005 - 1.030   pH 5.5  5.0 - 8.0   Glucose, UA NEGATIVE  NEGATIVE mg/dL   Hgb urine dipstick NEGATIVE  NEGATIVE   Bilirubin Urine NEGATIVE  NEGATIVE   Ketones, ur NEGATIVE  NEGATIVE mg/dL   Protein, ur NEGATIVE  NEGATIVE mg/dL   Urobilinogen, UA 0.2  0.0 - 1.0 mg/dL   Nitrite NEGATIVE  NEGATIVE   Leukocytes, UA NEGATIVE  NEGATIVE  TROPONIN I      Component Value Range   Troponin I 0.58 (*) <0.30 ng/mL  PROTIME-INR      Component Value Range   Prothrombin Time 24.8 (*) 11.6 - 15.2 seconds   INR 2.20 (*) 0.00 - 1.49   Dg Chest 2 View  09/16/2011  *RADIOLOGY REPORT*  Clinical Data: Shortness of breath, fever, cough  CHEST - 2 VIEW  Comparison: 05/20/2009  Findings: Low lung volumes with vascular crowding.  No definite interstitial edema. No pleural effusion or pneumothorax.  The heart is top normal in size.  Degenerative changes of the visualized thoracolumbar spine.  Lateral view is motion degraded.  IMPRESSION: Low lung volumes.  No definite interstitial edema.  Original Report Authenticated By: Charline Bills, M.D.   Ct Head Wo Contrast  09/16/2011   *RADIOLOGY REPORT*  Clinical Data: Altered mental status  CT HEAD WITHOUT CONTRAST  Technique:  Contiguous axial images were obtained from the base of the skull through the vertex without contrast.  Comparison: MRI brain dated 10/06/2009  Findings: No evidence of parenchymal hemorrhage or extra-axial fluid collection. No mass lesion, mass effect, or midline shift.  No CT evidence of acute infarction.  Subcortical white matter and periventricular small vessel ischemic changes.  Intracranial atherosclerosis.  Global cortical atrophy.  Stable ventriculomegaly.  The visualized paranasal sinuses are essentially clear. The mastoid air cells are unopacified.  No evidence of calvarial fracture.  IMPRESSION: No evidence of acute intracranial abnormality.  Global cortical atrophy with stable ventriculomegaly.  Small vessel ischemic changes with intracranial atherosclerosis  Original Report Authenticated By: Charline Bills, M.D.    Date: 09/16/2011  Rate: 127  Rhythm: atrial fibrillation  QRS Axis: normal  Intervals: normal  ST/T Wave abnormalities: nonspecific ST/T changes  Conduction Disutrbances:none  Narrative Interpretation:   Old EKG Reviewed: changes noted    1. Fever   2. NSTEMI (non-ST elevated myocardial infarction)       MDM  His ill appearing with tachycardia with apparent new onset angina fibrillation. He was febrile to 103 here. He is alert but confused. His symptoms of cough shortness of breath and fever her suggestive of pneumonia, however no definite infiltrate was seen on chest x-ray. I did treat a presumptive antibiotics including Zosyn and vancomycin PA and his EKG did not show any ST elevation however his troponin was mildly  elevated indicating a NSTEMI. Blood cultures and urine cultures are obtained. Patient was resuscitated. I suspect pneumonia and was on her etiology such as meningitis however with the confusion and fever however lumbar puncture cannot be obtained due to patient  being on Coumadin. Other possible etiologies for the fever could include a reoccurrence of a septic joint in his hip however he has no pain on range of motion at the hip. I did discuss the case with the hospitalist at [facility] who has accepted the patient for admission. We are currently awaiting transportation to [facility].  I personally performed the services described in this documentation, which was scribed in my presence.  The recorded information has been reviewed and considered.         Ricky Bucco, MD 09/16/11 1754

## 2011-09-16 NOTE — ED Notes (Signed)
Received critical troponin from lab (0.58); MD & primary RN made aware

## 2011-09-16 NOTE — Progress Notes (Signed)
MEDICATION RELATED CONSULT NOTE - INITIAL   Pharmacy Consult for Coumadin and Vancocin Indication: DVT/?Afib and r/o PNA  No Known Allergies  Patient Measurements: Height: 6\' 1"  (185.4 cm) Weight: 235 lb 0.2 oz (106.6 kg) IBW/kg (Calculated) : 79.9   Vital Signs: Temp: 97.1 F (36.2 C) (07/20 2028) Temp src: Oral (07/20 2028) BP: 115/67 mmHg (07/20 2028) Pulse Rate: 89  (07/20 2028)  Labs:  Basename 09/16/11 1510  WBC 27.3*  HGB 14.1  HCT 40.2  PLT 343  APTT --  CREATININE 1.00  LABCREA --  CREATININE 1.00  CREAT24HRUR --  MG --  PHOS --  ALBUMIN --  PROT --  ALBUMIN --  AST --  ALT --  ALKPHOS --  BILITOT --  BILIDIR --  IBILI --   Estimated Creatinine Clearance: 86.8 ml/min (by C-G formula based on Cr of 1).   Microbiology: No results found for this or any previous visit (from the past 720 hour(s)).  Medical History: Past Medical History  Diagnosis Date  . Dementia   . History of blood clots   . Acid reflux   . A-fib     Medications:  Prescriptions prior to admission  Medication Sig Dispense Refill  . ARIPiprazole (ABILIFY) 2 MG tablet Take 2 mg by mouth at bedtime. Patient uses 3 milligrams of this medication.      . Ascorbic Acid (VITAMIN C) 500 MG tablet Take 500 mg by mouth every morning.       Marland Kitchen b complex vitamins capsule Take 1 capsule by mouth every morning.        . Cholecalciferol (VITAMIN D3) 400 UNITS CAPS Take 1 capsule by mouth every morning.        . ferrous sulfate 325 (65 FE) MG tablet Take 325 mg by mouth daily with breakfast.      . hydrochlorothiazide (HYDRODIURIL) 50 MG tablet Take 50 mg by mouth daily.      . lansoprazole (PREVACID) 30 MG capsule Take 30 mg by mouth every morning.       . potassium chloride (KLOR-CON) 8 MEQ tablet Take 8 mEq by mouth 2 (two) times daily.      Marland Kitchen warfarin (COUMADIN) 3 MG tablet Take 3 mg by mouth daily.       Scheduled:    . acetaminophen  650 mg Oral Once  . ARIPiprazole  2 mg Oral QHS    . B-complex with vitamin C  1 tablet Oral Daily  . cholecalciferol  400 Units Oral Daily  . ferrous sulfate  325 mg Oral Q breakfast  . hydrochlorothiazide  50 mg Oral Daily  . ibuprofen  400 mg Oral Once  . pantoprazole  20 mg Oral Q1200  . piperacillin-tazobactam (ZOSYN)  IV  3.375 g Intravenous Once  . piperacillin-tazobactam (ZOSYN)  IV  3.375 g Intravenous Q8H  . potassium chloride  8 mEq Oral BID  . sodium chloride  1,000 mL Intravenous Once  . sodium chloride  3 mL Intravenous Q12H  . vancomycin  1,000 mg Intravenous Once  . vancomycin  1,000 mg Intravenous Q12H  . vitamin C  500 mg Oral Daily  . warfarin  3 mg Oral q1800  . Warfarin - Pharmacist Dosing Inpatient   Does not apply q1800  . DISCONTD: b complex vitamins  1 capsule Oral BH-q7a  . DISCONTD: Vitamin D3  1 capsule Oral BH-q7a    Assessment: 71yo male c/o cough/SOB, CXR negative though WBC elevated and temps to 103,  to begin IV ABX for suspected PNA; also to continue Coumadin for DVT, admitted with therapeutic INR (of note, pt has elevated troponin and apparent new-onset Afib, already fully anticoagulated but may need to switch to [facility] if studies indicated).  Goal of Therapy:  Vancomycin trough 15-20 INR 2-3  Plan:  Rec'd vancomycin 1g at [facility]; will continue with vancomycin 1000mg  IV Q12H (will not wait full 12hr to begin dosing since usually obese pts would require load); will continue home Coumadin dose of 3mg  daily; monitor CBC, Cx, INR, vanc levels prn.  Colleen Can PharmD BCPS 09/16/2011,10:30 PM

## 2011-09-16 NOTE — ED Notes (Signed)
Wife states pt had a physical on the 17th and was "fine". Yesterday, had PT and was [facility]. Trouble ambulating last p.m. Woke up about 3. Clammy. This a.m. Could not help wife get dressed as usual. Incont of urine. Right knee buckling. Taken to ED1. Placed on monitor, O2 2L/[place]. EKG being done. Dr. Anitra Lauth to bedside. Pt knows he is "at the hospital" , but has difficulty answering other questions. Hx mild dementia.

## 2011-09-16 NOTE — Progress Notes (Signed)
Sign Out Note  71 y/o gentleman with dementia presented to [facility] ER with his wife with increasing confusion and weakness in the past 24 hours.  Pt has developed fever 102, cough, SOB, weakness, difficulty ambulating.  Pt found to have temp 103 in ED.  Pt is on coumadin for DVT.  He was found to be in AFib with HR 120-130 (reportedly new diagnosis).  Pt has elevated WBC 27.  Pt had elevate lactic acid 3.2.  CXR no acute infiltrates but clinically being treated for pneumonia with empiric Zosyn/Vanc.   Urine negative.  Blood Cultures and Urine Cultures taken.  Pt is reported to be DNR per family.  Pt was accepted for a step down bed.   Maryln Manuel, MD pager [phone].

## 2011-09-17 ENCOUNTER — Inpatient Hospital Stay (HOSPITAL_COMMUNITY): Payer: BC Managed Care – PPO

## 2011-09-17 ENCOUNTER — Encounter (HOSPITAL_COMMUNITY): Payer: Self-pay | Admitting: Radiology

## 2011-09-17 DIAGNOSIS — I4891 Unspecified atrial fibrillation: Secondary | ICD-10-CM

## 2011-09-17 DIAGNOSIS — R509 Fever, unspecified: Secondary | ICD-10-CM

## 2011-09-17 DIAGNOSIS — E876 Hypokalemia: Secondary | ICD-10-CM | POA: Diagnosis present

## 2011-09-17 DIAGNOSIS — I214 Non-ST elevation (NSTEMI) myocardial infarction: Secondary | ICD-10-CM

## 2011-09-17 DIAGNOSIS — G9341 Metabolic encephalopathy: Secondary | ICD-10-CM

## 2011-09-17 LAB — COMPREHENSIVE METABOLIC PANEL
CO2: 22 mEq/L (ref 19–32)
Calcium: 8.4 mg/dL (ref 8.4–10.5)
Creatinine, Ser: 0.92 mg/dL (ref 0.50–1.35)
GFR calc Af Amer: >90 mL/min (ref >90)
GFR calc non Af Amer: 83 mL/min — ABNORMAL LOW (ref >90)
Glucose, Bld: 133 mg/dL — ABNORMAL HIGH (ref 70–99)

## 2011-09-17 LAB — CARDIAC PANEL(CRET KIN+CKTOT+MB+TROPI)
CK, MB: 2.4 ng/mL (ref 0.3–4.0)
CK, MB: 2.7 ng/mL (ref 0.3–4.0)
CK, MB: 3.9 ng/mL (ref 0.3–4.0)
Total CK: 220 U/L (ref 7–232)
Troponin I: 0.46 ng/mL (ref <0.30)
Troponin I: <0.3 ng/mL (ref <0.30)

## 2011-09-17 LAB — CBC
Hemoglobin: 13 g/dL (ref 13.0–17.0)
RBC: 4.32 MIL/uL (ref 4.22–5.81)

## 2011-09-17 LAB — PROTIME-INR
INR: 2.05 — ABNORMAL HIGH (ref 0.00–1.49)
Prothrombin Time: 23.5 seconds — ABNORMAL HIGH (ref 11.6–15.2)

## 2011-09-17 MED ORDER — POTASSIUM CHLORIDE CRYS ER 20 MEQ PO TBCR
40.0000 meq | EXTENDED_RELEASE_TABLET | Freq: Two times a day (BID) | ORAL | Status: AC
  Administered 2011-09-17 (×2): 40 meq via ORAL
  Filled 2011-09-17 (×2): qty 2

## 2011-09-17 MED ORDER — IBUPROFEN 100 MG/5ML PO SUSP
400.0000 mg | Freq: Once | ORAL | Status: AC
  Administered 2011-09-17: 400 mg via ORAL
  Filled 2011-09-17 (×2): qty 20

## 2011-09-17 MED ORDER — IOHEXOL 300 MG/ML  SOLN
100.0000 mL | Freq: Once | INTRAMUSCULAR | Status: AC | PRN
  Administered 2011-09-17: 100 mL via INTRAVENOUS

## 2011-09-17 MED ORDER — FUROSEMIDE 10 MG/ML IJ SOLN
20.0000 mg | Freq: Once | INTRAMUSCULAR | Status: AC
  Administered 2011-09-17: 20 mg via INTRAVENOUS
  Filled 2011-09-17: qty 2

## 2011-09-17 MED ORDER — DOCUSATE SODIUM 100 MG PO CAPS
200.0000 mg | ORAL_CAPSULE | Freq: Every day | ORAL | Status: DC
  Administered 2011-09-17 – 2011-09-22 (×6): 200 mg via ORAL
  Filled 2011-09-17 (×10): qty 2

## 2011-09-17 MED ORDER — DEXTROSE 5 % IV SOLN
500.0000 mg | INTRAVENOUS | Status: DC
  Administered 2011-09-17 – 2011-09-19 (×3): 500 mg via INTRAVENOUS
  Filled 2011-09-17 (×4): qty 500

## 2011-09-17 MED ORDER — ARIPIPRAZOLE 2 MG PO TABS
3.0000 mg | ORAL_TABLET | Freq: Every day | ORAL | Status: DC
  Administered 2011-09-17: 3 mg via ORAL
  Filled 2011-09-17 (×2): qty 2

## 2011-09-17 NOTE — Progress Notes (Signed)
CRITICAL VALUE ALERT  Critical value received:  Troponin 0.46 Date of notification:  09/17/11 Time of notification:  0102 Critical value read back: yes Nurse who received alert: Penelope Coop RN MD notified (1st page):  Lenny Pastel NP Time of first page: 0105 Responding MD:  Lenny Pastel NP  Time MD responded:  [phone]  Mr. Claiborne Billings requested that the admitting MD, Dr. Mikeal Hawthorne, be notified for continuity of care.   MD notified (2nd page): Dr. Mikeal Hawthorne Time of second page: 0110 Responding MD: Dr. Mikeal Hawthorne Time MD responded: [phone]
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
## 2011-09-17 NOTE — Progress Notes (Signed)
CC: 71yo male c/o cough/SOB, CXR negative though WBC elevated and temps to 103, to begin IV ABX for suspected PNA  Anticoag: hx of DVT; admitted with therapeutic INR (of note, pt has elevated troponin and apparent new-onset Afib, already fully anticoagulated but may need to switch to [facility] if studies indicated). Troponin 0.43; H/H 13/37.2; Plt 289.  INR today 2.05  Plan: 1) Continue coumadin 3mg  po qday 2) Daily PT/INR  ID: r/o PNA.  Rec'd vancomycin 1g at [facility]; will continue with vancomycin 1000mg  IV Q12H (will not wait full 12hr to begin dosing since usually obese pts would require load). MRSA negative. Tmax 103; WBC down a little to 23.6  07/20 blood x2 >> 07/20 ucx >>   07/20 Zosyn>> 07/20 vancomycin >>  Plan:  1) Continue vancomycin 1g iv q12h 2) monitor cx and plan on ABX before checking troughs

## 2011-09-17 NOTE — Progress Notes (Signed)
  Echocardiogram 2D Echocardiogram has been performed.  Lynnex Fulp, [facility] 09/17/2011, 11:23 AM

## 2011-09-18 ENCOUNTER — Inpatient Hospital Stay (HOSPITAL_COMMUNITY): Payer: BC Managed Care – PPO

## 2011-09-18 LAB — URINE CULTURE

## 2011-09-18 LAB — SYNOVIAL CELL COUNT + DIFF, W/ CRYSTALS
Eosinophils-Synovial: 0 % (ref 0–1)
Neutrophil, Synovial: 99 % — ABNORMAL HIGH (ref 0–25)

## 2011-09-18 LAB — CBC
HCT: 35.7 % — ABNORMAL LOW (ref 39.0–52.0)
MCV: 86.7 fL (ref 78.0–100.0)
RDW: 14.8 % (ref 11.5–15.5)
WBC: 19.6 10*3/uL — ABNORMAL HIGH (ref 4.0–10.5)

## 2011-09-18 LAB — BASIC METABOLIC PANEL
BUN: 16 mg/dL (ref 6–23)
Chloride: 92 mEq/L — ABNORMAL LOW (ref 96–112)
Creatinine, Ser: 0.85 mg/dL (ref 0.50–1.35)
GFR calc Af Amer: >90 mL/min (ref >90)
Glucose, Bld: 106 mg/dL — ABNORMAL HIGH (ref 70–99)

## 2011-09-18 LAB — GRAM STAIN

## 2011-09-18 LAB — CARDIAC PANEL(CRET KIN+CKTOT+MB+TROPI)
Relative Index: 1.1 (ref 0.0–2.5)
Total CK: 198 U/L (ref 7–232)
Total CK: 159 U/L (ref 7–232)
Troponin I: <0.3 ng/mL (ref <0.30)

## 2011-09-18 MED ORDER — WARFARIN SODIUM 4 MG PO TABS
4.0000 mg | ORAL_TABLET | Freq: Once | ORAL | Status: DC
  Filled 2011-09-18: qty 1

## 2011-09-18 MED ORDER — ENOXAPARIN SODIUM 40 MG/0.4ML ~~LOC~~ SOLN
40.0000 mg | Freq: Two times a day (BID) | SUBCUTANEOUS | Status: AC
  Administered 2011-09-18 – 2011-09-19 (×3): 40 mg via SUBCUTANEOUS
  Filled 2011-09-18 (×4): qty 0.4

## 2011-09-18 MED ORDER — ARIPIPRAZOLE 2 MG PO TABS
3.0000 mg | ORAL_TABLET | Freq: Every day | ORAL | Status: DC
  Administered 2011-09-18 – 2011-09-26 (×9): 3 mg via ORAL
  Filled 2011-09-18 (×9): qty 2

## 2011-09-18 MED ORDER — VITAMIN K1 10 MG/ML IJ SOLN
10.0000 mg | Freq: Once | INTRAVENOUS | Status: AC
  Administered 2011-09-18: 10 mg via INTRAVENOUS
  Filled 2011-09-18: qty 1

## 2011-09-18 MED FILL — Potassium Chloride Tab ER 8 mEq (600 MG): ORAL | Qty: 1 | Status: AC

## 2011-09-18 MED FILL — Warfarin Sodium Tab 3 MG: ORAL | Qty: 1 | Status: AC

## 2011-09-18 MED FILL — Vancomycin HCl For IV Soln 1 GM (Base Equivalent): INTRAVENOUS | Qty: 1000 | Status: AC

## 2011-09-18 MED FILL — Sodium Chloride Flush IV Soln 0.9%: INTRAVENOUS | Qty: 3 | Status: AC

## 2011-09-18 MED FILL — Docusate Sodium Cap 100 MG: ORAL | Qty: 1 | Status: AC

## 2011-09-18 MED FILL — Piperacillin Sod-Tazobactam Sod in Dex IV Sol 3-0.375GM/50ML: INTRAVENOUS | Qty: 50 | Status: AC

## 2011-09-18 NOTE — Progress Notes (Signed)
Patient ID: Ricky Rodriguez, male   DOB: 06-01-1940, 71 y.o.   MRN: [id number] Called to see pt. Aspirated  10 cc slightly cloudy yellow fliud from lateral to throchanter.  Stat Gm stain, cell count , diff and cultures also sent.   Hold coumadin pending Gram stain result.   May need to go to OR to have hip drained.    Full consult to follow.
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
## 2011-09-18 NOTE — Care Management Note (Signed)
    Page 1 of 1   09/21/2011     9:18:48 AM   CARE MANAGEMENT NOTE 09/21/2011  Patient:  Ricky Rodriguez, Ricky Rodriguez   Account Number:  [id number]  Date Initiated:  09/18/2011  Documentation initiated by:  Avie Arenas  Subjective/Objective Assessment:   Sepsis - AMS  Has spouse.     Action/Plan:   Anticipated DC Date:  09/22/2011   Anticipated DC Plan:  SKILLED NURSING FACILITY      DC Planning Services  CM consult      Choice offered to / List presented to:             Status of service:  In process, will continue to follow Medicare Important Message given?   (If response is "NO", the following Medicare IM given date fields will be blank) Date Medicare IM given:   Date Additional Medicare IM given:    Discharge Disposition:    Per UR Regulation:  Reviewed for med. necessity/level of care/duration of stay  If discussed at Long Length of Stay Meetings, dates discussed:    Comments:  ContactDee, [place] [phone]   [phone]                 Pocius,Christopher Son     [phone]  09-21-11 9:15am Avie Arenas, RNBSN [phone] Post debridement surgery - PT/OT ordered - awaiting recommendations.   Will continue to follow.  09-18-11 10am Avie Arenas, RNBSN - [phone] Talked with [name], spouse in room.  patient asleep.  prior to admission - lived at home - got along fairly well with some assistance.  Son stayed with him in morning and had caregiver with him in afternoon.  Wife home in evening after work.  Would like him to go to rehab - SNF - on discharge to be stronger prior to going home.
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
## 2011-09-18 NOTE — Progress Notes (Signed)
Patient's mental status is back to baseline according to his wife. He is afebrile on antibiotic coverage for a possible abscess around the left hip. A foley catheter is in place. I will remove the foley catheter to prevent infection. I will consult Dr Annell Greening to drain the fluid around the left hip to R/O abscess. I will consult [facility] to evaluate paroxysmal atrial fibrillation and the elevated troponin level.

## 2011-09-18 NOTE — Progress Notes (Addendum)
ANTIBIOTIC/ANTICOAGULATION - Follow Up Consult  Pharmacy Consult for coumadin/vancomycin Indication: Coumadin for h/o DVT and vanco/zosyn for ? HCAP and hip infection  No Known Allergies  Patient Measurements: Height: 6\' 1"  (185.4 cm) Weight: 235 lb 0.2 oz (106.6 kg) IBW/kg (Calculated) : 79.9  Heparin Dosing Weight:   Vital Signs: Temp: 97.3 F (36.3 C) (07/22 0412) Temp src: Oral (07/22 0412) BP: 133/72 mmHg (07/22 0412) Pulse Rate: 93  (07/22 0038)  Labs:  Basename 09/18/11 0425 09/18/11 0101 09/17/11 1430 09/17/11 0618 09/16/11 1510  HGB 12.4* -- -- 13.0 --  HCT 35.7* -- -- 37.2* 40.2  PLT 296 -- -- 289 343  APTT -- -- -- -- --  LABPROT 22.0* -- -- 23.5* 24.8*  INR 1.89* -- -- 2.05* 2.20*  HEPARINUNFRC -- -- -- -- --  CREATININE 0.85 -- -- 0.92 1.00  CKTOTAL -- 198 199 220 --  CKMB -- 1.8 2.4 2.7 --  TROPONINI -- <0.30 <0.30 0.43* --   Labs:  Basename 09/18/11 0425 09/17/11 0618 09/16/11 1510  WBC 19.6* 23.6* 27.3*  HGB 12.4* 13.0 14.1  PLT 296 289 343  LABCREA -- -- --  CREATININE 0.85 0.92 1.00   Estimated Creatinine Clearance: 102.1 ml/min (by C-G formula based on Cr of 0.85). No results found for this basename: VANCOTROUGH:2,VANCOPEAK:2,VANCORANDOM:2,GENTTROUGH:2,GENTPEAK:2,GENTRANDOM:2,TOBRATROUGH:2,TOBRAPEAK:2,TOBRARND:2,AMIKACINPEAK:2,AMIKACINTROU:2,AMIKACIN:2, in the last 72 hours   Microbiology: Recent Results (from the past 720 hour(s))  CULTURE, BLOOD (ROUTINE X 2)     Status: Normal (Preliminary result)   Collection Time   09/16/11  3:10 PM      Component Value Range Status Comment   Specimen Description BLOOD RIGHT ARM   Final    Special Requests BOTTLES DRAWN AEROBIC AND ANAEROBIC 5CC EACH   Final    Culture  Setup Time 09/16/2011 20:44   Final    Culture     Final    Value:        BLOOD CULTURE RECEIVED NO GROWTH TO DATE CULTURE WILL BE HELD FOR 5 DAYS BEFORE ISSUING A FINAL NEGATIVE REPORT   Report Status PENDING   Incomplete   CULTURE,  BLOOD (ROUTINE X 2)     Status: Normal (Preliminary result)   Collection Time   09/16/11  3:20 PM      Component Value Range Status Comment   Specimen Description BLOOD LEFT ARM   Final    Special Requests BOTTLES DRAWN AEROBIC AND ANAEROBIC 5CC EACH   Final    Culture  Setup Time 09/16/2011 20:44   Final    Culture     Final    Value:        BLOOD CULTURE RECEIVED NO GROWTH TO DATE CULTURE WILL BE HELD FOR 5 DAYS BEFORE ISSUING A FINAL NEGATIVE REPORT   Report Status PENDING   Incomplete   MRSA PCR SCREENING     Status: Normal   Collection Time   09/16/11  8:44 PM      Component Value Range Status Comment   MRSA by PCR NEGATIVE  NEGATIVE Final     Anti-infectives     Start     Dose/Rate Route Frequency Ordered Stop   09/17/11 1000   azithromycin (ZITHROMAX) 500 mg in dextrose 5 % 250 mL IVPB        500 mg 250 mL/hr over 60 Minutes Intravenous Every 24 hours 09/17/11 0825     09/17/11 0000   vancomycin (VANCOCIN) IVPB 1000 mg/200 mL premix        1,000  mg 200 mL/hr over 60 Minutes Intravenous Every 12 hours 09/16/11 2230     09/16/11 2230  piperacillin-tazobactam (ZOSYN) IVPB 3.375 g       3.375 g 12.5 mL/hr over 240 Minutes Intravenous 3 times per day 09/16/11 2219     09/16/11 1630  piperacillin-tazobactam (ZOSYN) IVPB 3.375 g       3.375 g 12.5 mL/hr over 240 Minutes Intravenous  Once 09/16/11 1619 09/16/11 2153   09/16/11 1630   vancomycin (VANCOCIN) IVPB 1000 mg/200 mL premix        1,000 mg 200 mL/hr over 60 Minutes Intravenous  Once 09/16/11 1619 09/16/11 1737          Assessment: 71 YOM presenting with cough, fever and SOB.  Started on empiric antibiotics for pneumonia vs. Left THA infection (fluid collection noted). WBC showing some improvement.  Afebrile.  He is on chronic coumadin for h/o VTE.  INR is slightly  subtherapeutic this am at 1.89 on home dose of 3mg  daily, Hgb is trending down from admission (? From [facility]), platelets stable.   07/20 blood x2  >> 07/20 ucx >>   07/20 Zosyn>> 07/20 vancomycin >>  Goal of Therapy:  Vancomycin trough level 15-20 mcg/ml INR 2-3  Plan:  1. Check vancomycin trough tomorrow  2. Coumadin 4mg  today for INR trending down and now subtherapeutic 3. Daily INR Dannielle Huh 09/18/2011,8:35 AM
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
## 2011-09-19 LAB — PATHOLOGIST SMEAR REVIEW

## 2011-09-19 LAB — PROTIME-INR
INR: 1.61 — ABNORMAL HIGH (ref 0.00–1.49)
Prothrombin Time: 19.4 seconds — ABNORMAL HIGH (ref 11.6–15.2)

## 2011-09-19 LAB — VANCOMYCIN, TROUGH: Vancomycin Tr: 8 ug/mL — ABNORMAL LOW (ref 10.0–20.0)

## 2011-09-19 NOTE — Progress Notes (Signed)
Problem: Soft tissue infection around the left hip. The patient remains afebrile on antibiotic coverage. He is scheduled to undergo surgical abscess drainage tomorrow afternoon. His Coumadin was stopped yesterday and is on subcutaneous Lovenox.  Problem: Hypertension. The patient remains normotensive on his antihypertensive medication.

## 2011-09-19 NOTE — Progress Notes (Addendum)
Subjective:  No complaints of chest pain, SOB. Wife doing a lot of the talking for him. He stares at me and smiles at times but is slow to respond.   Objective:  Vital Signs in the last 24 hours: Temp:  [97.3 F (36.3 C)-98.8 F (37.1 C)] 97.3 F (36.3 C) (07/23 0800) Pulse Rate:  [82-95] 82  (07/23 0800) Resp:  [18-29] 18  (07/23 0800) BP: (106-138)/(56-73) 116/56 mmHg (07/23 0900) SpO2:  [92 %-100 %] 97 % (07/23 0800) Weight:  [110.5 kg (243 lb 9.7 oz)] 110.5 kg (243 lb 9.7 oz) (07/23 0600)  Intake/Output from previous day: 07/22 0701 - 07/23 0700 In: 910 [P.O.:150; I.V.:10; IV Piggyback:750] Out: 2000 [Urine:2000]   Physical Exam: General: Slow to respond in no acute distress. Head:  Normocephalic and atraumatic. Lungs: Clear to auscultation and percussion. Heart: Normal S1 and S2.  No murmur, rubs or gallops. Occasional ECTOPY Abdomen: soft, non-tender, positive bowel sounds. Extremities: No clubbing or cyanosis. No edema. Foley Neurologic: Alert and oriented x 3.    Lab Results:  Basename 09/18/11 0425 09/17/11 0618  WBC 19.6* 23.6*  HGB 12.4* 13.0  PLT 296 289    Basename 09/18/11 0425 09/17/11 0618  NA 130* 132*  K 3.1* 2.9*  CL 92* 97  CO2 22 22  GLUCOSE 106* 133*  BUN 16 18  CREATININE 0.85 0.92    Basename 09/18/11 0807 09/18/11 0101  TROPONINI <0.30 <0.30   Hepatic Function Panel  Basename 09/17/11 0618  PROT 6.4  ALBUMIN 3.2*  AST 20  ALT 13  ALKPHOS 121*  BILITOT 0.8  BILIDIR --  IBILI --    Imaging: Dg Hip Portable 1 View Left  09/18/2011  *RADIOLOGY REPORT*  Clinical Data: Preop hip surgery  PORTABLE LEFT HIP - 1 VIEW  Comparison: CT 09/17/2011  Findings: Left total hip replacement in satisfactory position and alignment.  No hardware fracture.  No loosening of the prosthesis. Heterotopic bone is seen inferior to the acetabulum.  IMPRESSION: Left hip replacement without acute abnormality.  Original Report Authenticated By: Camelia Phenes, M.D.   Personally viewed.   Telemetry: SR, with PAC's PVC Personally viewed.   Cardiac Studies:  ECHO 09/17/11 - Left ventricle: The cavity size was normal. Wall thickness was normal. Systolic function was normal. The estimated ejection fraction was in the range of 55% to 60%. Possible hypokinesis of the distalanteroseptal myocardium. Features are consistent with a pseudonormal left ventricular filling pattern, with concomitant abnormal relaxation and increased filling pressure (grade 2 diastolic dysfunction). - Mitral valve: Mildly thickened leaflets . Trivial regurgitation. - Left atrium: The atrium was mildly dilated. - Tricuspid valve: Trivial regurgitation. - Pericardium, extracardiac: There was no pericardial effusion.    Assessment/Plan:  Principal Problem:  *Sepsis Active Problems:  Fever  Encephalopathy in sepsis  Troponin level elevated  Atrial fibrillation with RVR  Dementia  Hypokalemia  NSTEMI (non-ST elevated myocardial infarction)  1. NSTEMI - type 2 in the setting of infection/sepsis. EF reassuring. Once ready for discharge, please notify us and we will set up outpatient evaluation with NUC stress test - pharmacologic. Dr. Mayford Knife. Mildly elevated troponin. Consider ASA 81mg . Consider statin as well. Once able from BP standpoint, consider low dose beta blocker.   2. Abnormal ECG - Sinus with PAC's. Agree with Dr. Mayford Knife. Regardless, on chronic anticoagulation for DVT's.  Leukocytosis improving. Cognition issues noted. Keep K>4.   Will sign off. Please notify us when discharged to set up stress test and  30 day event monitor as outpatient. Dr. Mayford Knife.    Maryum Batterson 09/19/2011, 11:11 AM

## 2011-09-19 NOTE — Progress Notes (Signed)
Patient ID: Ricky Rodriguez, male   DOB: 1940-08-21, 71 y.o.   MRN: [id number] Patient had infection with MRSE while at [facility] .   Will plan on VANC/Tobra beads placed at time of surgery and will need PICC line placed post op and 6 wks of Vanc post op.

## 2011-09-19 NOTE — Progress Notes (Signed)
ANTIBIOTIC/ANTICOAGULATION - Follow Up Consult  Pharmacy Consult for coumadin/vancomycin Indication: Coumadin for h/o DVT and vanco/zosyn for hip infection  No Known Allergies  Patient Measurements: Height: 6\' 1"  (185.4 cm) Weight: 243 lb 9.7 oz (110.5 kg) IBW/kg (Calculated) : 79.9  Heparin Dosing Weight:   Vital Signs: Temp: 97.7 F (36.5 C) (07/23 1225) Temp src: Oral (07/23 1225) BP: 110/59 mmHg (07/23 1225) Pulse Rate: 77  (07/23 1225)  Labs:  Alvira Philips 09/19/11 0355 09/18/11 0807 09/18/11 0425 09/18/11 0101 09/17/11 1430 09/17/11 0618 09/16/11 1510  HGB -- -- 12.4* -- -- 13.0 --  HCT -- -- 35.7* -- -- 37.2* 40.2  PLT -- -- 296 -- -- 289 343  APTT -- -- -- -- -- -- --  LABPROT 19.4* -- 22.0* -- -- 23.5* --  INR 1.61* -- 1.89* -- -- 2.05* --  HEPARINUNFRC -- -- -- -- -- -- --  CREATININE -- -- 0.85 -- -- 0.92 1.00  CKTOTAL -- 159 -- 198 199 -- --  CKMB -- 1.8 -- 1.8 2.4 -- --  TROPONINI -- <0.30 -- <0.30 <0.30 -- --   Labs:  Basename 09/18/11 0425 09/17/11 0618 09/16/11 1510  WBC 19.6* 23.6* 27.3*  HGB 12.4* 13.0 14.1  PLT 296 289 343  LABCREA -- -- --  CREATININE 0.85 0.92 1.00   Estimated Creatinine Clearance: 103.8 ml/min (by C-G formula based on Cr of 0.85).  Basename 09/19/11 1129  VANCOTROUGH 8.0*  VANCOPEAK --  Drue Dun --  GENTTROUGH --  GENTPEAK --  GENTRANDOM --  TOBRATROUGH --  TOBRAPEAK --  TOBRARND --  AMIKACINPEAK --  AMIKACINTROU --  AMIKACIN --     Microbiology: Recent Results (from the past 720 hour(s))  CULTURE, BLOOD (ROUTINE X 2)     Status: Normal (Preliminary result)   Collection Time   09/16/11  3:10 PM      Component Value Range Status Comment   Specimen Description BLOOD RIGHT ARM   Final    Special Requests BOTTLES DRAWN AEROBIC AND ANAEROBIC 5CC EACH   Final    Culture  Setup Time 09/16/2011 20:44   Final    Culture     Final    Value:        BLOOD CULTURE RECEIVED NO GROWTH TO DATE CULTURE WILL BE HELD FOR 5 DAYS  BEFORE ISSUING A FINAL NEGATIVE REPORT   Report Status PENDING   Incomplete   CULTURE, BLOOD (ROUTINE X 2)     Status: Normal (Preliminary result)   Collection Time   09/16/11  3:20 PM      Component Value Range Status Comment   Specimen Description BLOOD LEFT ARM   Final    Special Requests BOTTLES DRAWN AEROBIC AND ANAEROBIC 5CC EACH   Final    Culture  Setup Time 09/16/2011 20:44   Final    Culture     Final    Value:        BLOOD CULTURE RECEIVED NO GROWTH TO DATE CULTURE WILL BE HELD FOR 5 DAYS BEFORE ISSUING A FINAL NEGATIVE REPORT   Report Status PENDING   Incomplete   URINE CULTURE     Status: Normal   Collection Time   09/16/11  4:20 PM      Component Value Range Status Comment   Specimen Description URINE, CLEAN CATCH   Final    Special Requests NONE   Final    Culture  Setup Time 09/17/2011 11:30   Final    Colony Count  NO GROWTH   Final    Culture NO GROWTH   Final    Report Status 09/18/2011 FINAL   Final   MRSA PCR SCREENING     Status: Normal   Collection Time   09/16/11  8:44 PM      Component Value Range Status Comment   MRSA by PCR NEGATIVE  NEGATIVE Final   GRAM STAIN     Status: Normal   Collection Time   09/18/11  2:00 PM      Component Value Range Status Comment   Specimen Description SYNOVIAL FLUID HIP LEFT   Final    Special Requests NONE   Final    Gram Stain     Final    Value: ABUNDANT WBC PRESENT,BOTH PMN AND MONONUCLEAR     FEW GRAM POSITIVE COCCI IN PAIRS IN CLUSTERS   Report Status 09/18/2011 FINAL   Final   BODY FLUID CULTURE     Status: Normal (Preliminary result)   Collection Time   09/18/11  2:00 PM      Component Value Range Status Comment   Specimen Description SYNOVIAL FLUID HIP LEFT   Final    Special Requests 6CC   Final    Gram Stain PENDING   Incomplete    Culture NO GROWTH 1 DAY   Final    Report Status PENDING   Incomplete     Anti-infectives     Start     Dose/Rate Route Frequency Ordered Stop   09/17/11 1000   azithromycin  (ZITHROMAX) 500 mg in dextrose 5 % 250 mL IVPB        500 mg 250 mL/hr over 60 Minutes Intravenous Every 24 hours 09/17/11 0825     09/17/11 0000   vancomycin (VANCOCIN) IVPB 1000 mg/200 mL premix        1,000 mg 200 mL/hr over 60 Minutes Intravenous Every 12 hours 09/16/11 2230     09/16/11 2230   piperacillin-tazobactam (ZOSYN) IVPB 3.375 g        3.375 g 12.5 mL/hr over 240 Minutes Intravenous 3 times per day 09/16/11 2219     09/16/11 1630   piperacillin-tazobactam (ZOSYN) IVPB 3.375 g        3.375 g 12.5 mL/hr over 240 Minutes Intravenous  Once 09/16/11 1619 09/16/11 2153   09/16/11 1630   vancomycin (VANCOCIN) IVPB 1000 mg/200 mL premix        1,000 mg 200 mL/hr over 60 Minutes Intravenous  Once 09/16/11 1619 09/16/11 1737          Assessment: 71 YOM presenting with cough, fever and SOB.  Started on empiric antibiotics for pneumonia vs. Left THA infection (fluid collection noted). WBC showing some improvement.  Afebrile.  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  Coumadin on hold and given vit K 10mg  to reverse INR for surgery 7/24 for polyexchange of previous THA. INR is slightly  INR=1.61.  He is ordered lovenox 40mg  SQ q12h while warfarin on hold. Warfarin home dose was 3mg  daily.   7/23: vancomycin trough = 8 mcg/ml on 1gm IV q12h (prior to 7th dose)  07/20 blood x2 >>NGTD 07/20 ucx (UA neg)>> NG 7/22 Hip synovial fluid >>NGTD (gram stain-few GPC pairs/clusters)  07/20 Zosyn>> 07/20 vancomycin >> 7/21 azithromycin >>  Goal of Therapy:  Vancomycin trough level 15-20 mcg/ml INR 2-3  Plan:  1. Increase vancomycin to 1500mg  IV q12h secondary to low trough, typically like trough to be ~4mcg/ml.  Hesitant to change to q8h given patient's age and concern for accumulation.  Predict new dose to give trough 12-13 mcg/ml.   2. Follow renal function and recheck steady state trough 3.  Question if need to continue azithromycin  Dannielle Huh 09/19/2011,1:13 PM

## 2011-09-19 NOTE — Progress Notes (Signed)
Calling out for various reasons. Wanted this nurse to call his wife. Called x 2 no answer.

## 2011-09-20 ENCOUNTER — Encounter (HOSPITAL_COMMUNITY): Payer: Self-pay | Admitting: Anesthesiology

## 2011-09-20 ENCOUNTER — Encounter (HOSPITAL_COMMUNITY)
Admission: EM | Disposition: A | Discharge status: Skilled Nursing Facility | Payer: Self-pay | Source: Home / Self Care | Attending: Gastroenterology

## 2011-09-20 ENCOUNTER — Inpatient Hospital Stay (HOSPITAL_COMMUNITY): Payer: BC Managed Care – PPO | Admitting: Anesthesiology

## 2011-09-20 DIAGNOSIS — T8452XA Infection and inflammatory reaction due to internal left hip prosthesis, initial encounter: Secondary | ICD-10-CM | POA: Diagnosis present

## 2011-09-20 HISTORY — PX: INCISION AND DRAINAGE HIP: SHX1801

## 2011-09-20 LAB — GLUCOSE, CAPILLARY: Glucose-Capillary: 116 mg/dL — ABNORMAL HIGH (ref 70–99)

## 2011-09-20 LAB — COMPREHENSIVE METABOLIC PANEL
BUN: 15 mg/dL (ref 6–23)
CO2: 22 mEq/L (ref 19–32)
Calcium: 8.3 mg/dL — ABNORMAL LOW (ref 8.4–10.5)
Creatinine, Ser: 0.86 mg/dL (ref 0.50–1.35)
GFR calc Af Amer: >90 mL/min (ref >90)
GFR calc non Af Amer: 85 mL/min — ABNORMAL LOW (ref >90)
Glucose, Bld: 183 mg/dL — ABNORMAL HIGH (ref 70–99)
Sodium: 129 mEq/L — ABNORMAL LOW (ref 135–145)
Total Protein: 6.1 g/dL (ref 6.0–8.3)

## 2011-09-20 LAB — SURGICAL PCR SCREEN
MRSA, PCR: NEGATIVE
Staphylococcus aureus: NEGATIVE

## 2011-09-20 SURGERY — IRRIGATION AND DEBRIDEMENT HIP WITH POLY EXCHANGE
Anesthesia: General | Site: Hip | Laterality: Left | Wound class: Dirty or Infected

## 2011-09-20 MED ORDER — SUCCINYLCHOLINE CHLORIDE 20 MG/ML IJ SOLN
INTRAMUSCULAR | Status: DC | PRN
  Administered 2011-09-20: 100 mg via INTRAVENOUS

## 2011-09-20 MED ORDER — FENTANYL CITRATE 0.05 MG/ML IJ SOLN: 50.0000 ug | INTRAMUSCULAR | Status: DC | PRN

## 2011-09-20 MED ORDER — SODIUM CHLORIDE 0.9 % IR SOLN
Status: DC | PRN
  Administered 2011-09-20: 1000 mL

## 2011-09-20 MED ORDER — POTASSIUM CHLORIDE 10 MEQ/100ML IV SOLN
INTRAVENOUS | Status: AC
  Administered 2011-09-20: 10 meq via INTRAVENOUS
  Filled 2011-09-20: qty 100

## 2011-09-20 MED ORDER — VANCOMYCIN HCL 1000 MG IV SOLR
1000.0000 mg | INTRAVENOUS | Status: DC
  Filled 2011-09-20: qty 1000

## 2011-09-20 MED ORDER — OXYCODONE HCL 5 MG PO TABS: 5.0000 mg | ORAL_TABLET | ORAL | Status: DC | PRN

## 2011-09-20 MED ORDER — GENTAMICIN SULFATE 40 MG/ML IJ SOLN
INTRAMUSCULAR | Status: AC
  Filled 2011-09-20: qty 6

## 2011-09-20 MED ORDER — LACTATED RINGERS IV SOLN
INTRAVENOUS | Status: DC | PRN
  Administered 2011-09-20 (×2): via INTRAVENOUS

## 2011-09-20 MED ORDER — GENTAMICIN SULFATE 40 MG/ML IJ SOLN
INTRAMUSCULAR | Status: DC | PRN
  Administered 2011-09-20 (×3): 80 mg via INTRAMUSCULAR

## 2011-09-20 MED ORDER — MIDAZOLAM HCL 2 MG/2ML IJ SOLN: 1.0000 mg | INTRAMUSCULAR | Status: DC | PRN

## 2011-09-20 MED ORDER — PHENOL 1.4 % MT LIQD
1.0000 | OROMUCOSAL | Status: DC | PRN
Start: 2011-09-20 — End: 2011-09-26
  Filled 2011-09-20: qty 177

## 2011-09-20 MED ORDER — DOCUSATE SODIUM 100 MG PO CAPS: 100.0000 mg | ORAL_CAPSULE | Freq: Two times a day (BID) | ORAL | Status: DC

## 2011-09-20 MED ORDER — HYDROMORPHONE HCL PF 1 MG/ML IJ SOLN
INTRAMUSCULAR | Status: AC
  Administered 2011-09-20: 0.5 mg via INTRAVENOUS
  Filled 2011-09-20: qty 1

## 2011-09-20 MED ORDER — METOCLOPRAMIDE HCL 5 MG PO TABS
5.0000 mg | ORAL_TABLET | Freq: Three times a day (TID) | ORAL | Status: DC | PRN
  Filled 2011-09-20: qty 2

## 2011-09-20 MED ORDER — METHOCARBAMOL 500 MG PO TABS
500.0000 mg | ORAL_TABLET | Freq: Four times a day (QID) | ORAL | Status: DC | PRN
  Administered 2011-09-24: 500 mg via ORAL
  Filled 2011-09-20 (×2): qty 1

## 2011-09-20 MED ORDER — ETOMIDATE 2 MG/ML IV SOLN
INTRAVENOUS | Status: DC | PRN
  Administered 2011-09-20: 16 mg via INTRAVENOUS

## 2011-09-20 MED ORDER — FENTANYL CITRATE 0.05 MG/ML IJ SOLN
INTRAMUSCULAR | Status: DC | PRN
  Administered 2011-09-20 (×2): 100 ug via INTRAVENOUS

## 2011-09-20 MED ORDER — BISACODYL 10 MG RE SUPP: 10.0000 mg | Freq: Every day | RECTAL | Status: DC | PRN

## 2011-09-20 MED ORDER — METHOCARBAMOL 100 MG/ML IJ SOLN
500.0000 mg | Freq: Four times a day (QID) | INTRAVENOUS | Status: DC | PRN
  Filled 2011-09-20: qty 5

## 2011-09-20 MED ORDER — HYDROMORPHONE HCL PF 1 MG/ML IJ SOLN
0.2500 mg | INTRAMUSCULAR | Status: DC | PRN
  Administered 2011-09-20 (×5): 0.5 mg via INTRAVENOUS

## 2011-09-20 MED ORDER — LORAZEPAM 2 MG/ML IJ SOLN: 1.0000 mg | Freq: Once | INTRAMUSCULAR | Status: DC | PRN

## 2011-09-20 MED ORDER — POTASSIUM CHLORIDE 10 MEQ/100ML IV SOLN
10.0000 meq | INTRAVENOUS | Status: AC
  Administered 2011-09-20 (×6): 10 meq via INTRAVENOUS
  Filled 2011-09-20 (×5): qty 100

## 2011-09-20 MED ORDER — METOCLOPRAMIDE HCL 5 MG/ML IJ SOLN
5.0000 mg | Freq: Three times a day (TID) | INTRAMUSCULAR | Status: DC | PRN
  Filled 2011-09-20: qty 2

## 2011-09-20 MED ORDER — VANCOMYCIN HCL 1000 MG IV SOLR
INTRAVENOUS | Status: DC | PRN
  Administered 2011-09-20: 1000 mg

## 2011-09-20 MED ORDER — MORPHINE SULFATE 2 MG/ML IJ SOLN: 1.0000 mg | INTRAMUSCULAR | Status: DC | PRN

## 2011-09-20 MED ORDER — LACTATED RINGERS IV SOLN
INTRAVENOUS | Status: DC
  Administered 2011-09-20 (×2): via INTRAVENOUS

## 2011-09-20 MED ORDER — HYDROMORPHONE HCL PF 1 MG/ML IJ SOLN
INTRAMUSCULAR | Status: AC
  Filled 2011-09-20: qty 1

## 2011-09-20 MED ORDER — POTASSIUM CHLORIDE IN NACL 20-0.9 MEQ/L-% IV SOLN
INTRAVENOUS | Status: DC
  Administered 2011-09-20: 22:00:00 via INTRAVENOUS
  Administered 2011-09-21: 75 mL/h via INTRAVENOUS
  Filled 2011-09-20 (×4): qty 1000

## 2011-09-20 MED ORDER — MENTHOL 3 MG MT LOZG
1.0000 | LOZENGE | OROMUCOSAL | Status: DC | PRN
  Filled 2011-09-20: qty 9

## 2011-09-20 MED ORDER — WARFARIN - PHARMACIST DOSING INPATIENT: Freq: Every day | Status: DC

## 2011-09-20 MED ORDER — WARFARIN SODIUM 5 MG PO TABS
5.0000 mg | ORAL_TABLET | Freq: Once | ORAL | Status: AC
  Administered 2011-09-20: 5 mg via ORAL
  Filled 2011-09-20: qty 1

## 2011-09-20 SURGICAL SUPPLY — 49 items
BOWL SMART MIX CTS (DISPOSABLE) IMPLANT
CLOTH BEACON ORANGE TIMEOUT ST (SAFETY) ×2 IMPLANT
COVER SURGICAL LIGHT HANDLE (MISCELLANEOUS) ×2 IMPLANT
DRAPE ORTHO SPLIT 77X108 STRL (DRAPES) ×4
DRAPE SURG ORHT 6 SPLT 77X108 (DRAPES) ×2 IMPLANT
DRAPE U-SHAPE 47X51 STRL (DRAPES) ×2 IMPLANT
DRSG PAD ABDOMINAL 8X10 ST (GAUZE/BANDAGES/DRESSINGS) ×4 IMPLANT
DURAPREP 26ML APPLICATOR (WOUND CARE) ×2 IMPLANT
ELECT CAUTERY BLADE 6.4 (BLADE) ×2 IMPLANT
ELECT REM PT RETURN 9FT ADLT (ELECTROSURGICAL) ×2
ELECTRODE REM PT RTRN 9FT ADLT (ELECTROSURGICAL) ×1 IMPLANT
EVACUATOR 1/8 PVC DRAIN (DRAIN) ×2 IMPLANT
GAUZE XEROFORM 5X9 LF (GAUZE/BANDAGES/DRESSINGS) ×2 IMPLANT
GLOVE BIOGEL PI IND STRL 7.0 (GLOVE) ×1 IMPLANT
GLOVE BIOGEL PI IND STRL 7.5 (GLOVE) ×1 IMPLANT
GLOVE BIOGEL PI IND STRL 8 (GLOVE) ×1 IMPLANT
GLOVE BIOGEL PI INDICATOR 7.0 (GLOVE) ×1
GLOVE BIOGEL PI INDICATOR 7.5 (GLOVE) ×1
GLOVE BIOGEL PI INDICATOR 8 (GLOVE) ×1
GLOVE ECLIPSE 7.0 STRL STRAW (GLOVE) ×2 IMPLANT
GLOVE ORTHO TXT STRL SZ7.5 (GLOVE) ×2 IMPLANT
GOWN PREVENTION PLUS LG XLONG (DISPOSABLE) ×2 IMPLANT
GOWN STRL NON-REIN LRG LVL3 (GOWN DISPOSABLE) ×10 IMPLANT
HANDPIECE INTERPULSE COAX TIP (DISPOSABLE) ×2
INSERT POLYTHENE (Insert) ×2 IMPLANT
KIT BASIN OR (CUSTOM PROCEDURE TRAY) ×2 IMPLANT
KIT ROOM TURNOVER OR (KITS) ×2 IMPLANT
KIT STIMULAN RAPID CURE  10CC (Orthopedic Implant) ×1 IMPLANT
KIT STIMULAN RAPID CURE 10CC (Orthopedic Implant) ×1 IMPLANT
MANIFOLD NEPTUNE II (INSTRUMENTS) ×2 IMPLANT
NS IRRIG 1000ML POUR BTL (IV SOLUTION) ×2 IMPLANT
PACK TOTAL JOINT (CUSTOM PROCEDURE TRAY) ×2 IMPLANT
PAD ARMBOARD 7.5X6 YLW CONV (MISCELLANEOUS) ×4 IMPLANT
SET HNDPC FAN SPRY TIP SCT (DISPOSABLE) ×1 IMPLANT
SPONGE GAUZE 4X4 12PLY (GAUZE/BANDAGES/DRESSINGS) ×4 IMPLANT
SPONGE LAP 18X18 X RAY DECT (DISPOSABLE) ×2 IMPLANT
STAPLER VISISTAT 35W (STAPLE) ×2 IMPLANT
SUT PDS AB 1 CT  36 (SUTURE)
SUT PDS AB 1 CT 36 (SUTURE) IMPLANT
SUT VIC AB 0 CT1 27 (SUTURE) ×8
SUT VIC AB 0 CT1 27XBRD ANBCTR (SUTURE) ×4 IMPLANT
SUT VIC AB 2-0 CT1 27 (SUTURE) ×4
SUT VIC AB 2-0 CT1 TAPERPNT 27 (SUTURE) ×2 IMPLANT
TOWEL OR 17X24 6PK STRL BLUE (TOWEL DISPOSABLE) ×2 IMPLANT
TOWEL OR 17X26 10 PK STRL BLUE (TOWEL DISPOSABLE) ×2 IMPLANT
TRAY FOLEY CATH 14FRSI W/METER (CATHETERS) ×2 IMPLANT
TUBE ANAEROBIC SPECIMEN COL (MISCELLANEOUS) ×2 IMPLANT
UNDERPAD 30X30 INCONTINENT (UNDERPADS AND DIAPERS) ×2 IMPLANT
WATER STERILE IRR 1000ML POUR (IV SOLUTION) ×2 IMPLANT

## 2011-09-20 NOTE — Anesthesia Preprocedure Evaluation (Addendum)
Anesthesia Evaluation  Patient identified by MRN, date of birth, ID band Patient awake    Reviewed: Allergy & Precautions, H&P , NPO status , Patient's Chart, lab work & pertinent test results  Airway Mallampati: I TM Distance: >3 FB Neck ROM: Full    Dental   Pulmonary shortness of breath,          Cardiovascular     Neuro/Psych    GI/Hepatic GERD-  ,  Endo/Other    Renal/GU      Musculoskeletal   Abdominal   Peds  Hematology   Anesthesia Other Findings   Reproductive/Obstetrics                          Anesthesia Physical Anesthesia Plan  ASA: II  Anesthesia Plan: General   Post-op Pain Management:    Induction: Intravenous  Airway Management Planned: Oral ETT  Additional Equipment:   Intra-op Plan:   Post-operative Plan: Extubation in OR  Informed Consent: I have reviewed the patients History and Physical, chart, labs and discussed the procedure including the risks, benefits and alternatives for the proposed anesthesia with the patient or authorized representative who has indicated his/her understanding and acceptance.     Plan Discussed with: CRNA and Surgeon  Anesthesia Plan Comments:         Anesthesia Quick Evaluation

## 2011-09-20 NOTE — Brief Op Note (Cosign Needed)
09/16/2011 - 09/20/2011  5:58 PM  PATIENT:  Ricky Rodriguez  71 y.o. male  PRE-OPERATIVE DIAGNOSIS:  Infected Left Total Hip  POST-OPERATIVE DIAGNOSIS:  Infected Left Total Hip  PROCEDURE:  Procedure(s) (LRB): IRRIGATION AND DEBRIDEMENT HIP WITH POLY EXCHANGE (Left)  SURGEON:  Surgeon(s) and Role:    * Eldred Manges, MD - Primary  PHYSICIAN ASSISTANT: [facility]  ASSISTANTS: none   ANESTHESIA:   general  EBL:  Total I/O In: 1930 [I.V.:1480; IV Piggyback:450] Out: 2251 [Urine:2250; Stool:1]  BLOOD ADMINISTERED:none  DRAINS: (1) Hemovact drain(s) in the left hip with  Suction Open   LOCAL MEDICATIONS USED:  NONE  SPECIMEN:  Source of Specimen:  left hip joint  DISPOSITION OF SPECIMEN:  micro for culture and sensitivity  COUNTS:  YES  TOURNIQUET:  * No tourniquets in log *  DICTATION: .Note written in EPIC  PLAN OF CARE: Admit to inpatient   PATIENT DISPOSITION:  PACU - hemodynamically stable.   Delay start of Pharmacological VTE agent (>24hrs) due to surgical blood loss or risk of bleeding: yes

## 2011-09-20 NOTE — Progress Notes (Signed)
Clinical Social Worker reviewed chart and staffed case with RN-Anna.  Referral made for potential SNF placement at time of dc.  Pt to have surgery, CSW to continue to follow and assist as needed.  Full assessment to follow once medically appropriate.  MD, please consider ordering a PT/OT order once medically appropriate to assess for post acute needs.   Angelia Mould, MSW, [place] [phone]

## 2011-09-20 NOTE — H&P (View-Only) (Signed)
Patient ID: Ricky Rodriguez, male   DOB: 01/27/1941, 71 y.o.   MRN: [id number] Patient had infection with MRSE while at [facility] .   Will plan on VANC/Tobra beads placed at time of surgery and will need PICC line placed post op and 6 wks of Vanc post op.  

## 2011-09-20 NOTE — Anesthesia Postprocedure Evaluation (Signed)
  Anesthesia Post-op Note  Patient: Ricky Rodriguez  Procedure(s) Performed: Procedure(s) (LRB): IRRIGATION AND DEBRIDEMENT HIP WITH POLY EXCHANGE (Left)  Patient Location: PACU  Anesthesia Type: General  Level of Consciousness: awake  Airway and Oxygen Therapy: Patient Spontanous Breathing  Post-op Pain: mild  Post-op Assessment: Post-op Vital signs reviewed, Patient's Cardiovascular Status Stable, Respiratory Function Stable, Patent Airway, No signs of Nausea or vomiting and Pain level controlled  Post-op Vital Signs: stable  Complications: No apparent anesthesia complications 

## 2011-09-20 NOTE — Interval H&P Note (Signed)
History and Physical Interval Note:  09/20/2011 3:35 PM  Ricky Rodriguez  has presented today for surgery, with the diagnosis of Infected Left Total Hip  The various methods of treatment have been discussed with the patient and family. After consideration of risks, benefits and other options for treatment, the patient has consented to  Procedure(s) (LRB): IRRIGATION AND DEBRIDEMENT HIP WITH POLY EXCHANGE (Left) as a surgical intervention .  The patient's history has been reviewed, patient examined, no change in status, stable for surgery.  I have reviewed the patient's chart and labs.  Questions were answered to the patient's satisfaction.     Ricky Rodriguez C

## 2011-09-20 NOTE — Progress Notes (Signed)
CRITICAL VALUE ALERT  Critical value received:  Potassium  Date of notification:  09/20/11  Time of notification:  0300  Critical value read back:K+ 2.7  Nurse who received alert:  Hinton Rao, RN  MD notified (1st page):  Dr. Valentina Lucks  Time of first page:  0315  MD notified (2nd page): Dr. Valentina Lucks  Time of second page: 0400  Responding MD:  Dr. Valentina Lucks  Time MD responded:  0400  See new orders. M. Foster Simpson, RN

## 2011-09-20 NOTE — Progress Notes (Signed)
ANTICOAGULATION CONSULT NOTE - Initial Consult  Pharmacy Consult for Warfarin Indication: Hx DVT and VTE px s/p I&D and poly exchange of THA on 7/24  No Known Allergies  Patient Measurements: Height: 6\' 1"  (185.4 cm) Weight: 242 lb 15.2 oz (110.2 kg) IBW/kg (Calculated) : 79.9   Vital Signs: Temp: 97.6 F (36.4 C) (07/24 2005) Temp src: Oral (07/24 2005) BP: 134/84 mmHg (07/24 2005) Pulse Rate: 93  (07/24 2005)  Labs:  Alvira Philips 09/20/11 0124 09/19/11 0355 09/18/11 0807 09/18/11 0425 09/18/11 0101  HGB -- -- -- 12.4* --  HCT -- -- -- 35.7* --  PLT -- -- -- 296 --  APTT -- -- -- -- --  LABPROT 16.2* 19.4* -- 22.0* --  INR 1.27 1.61* -- 1.89* --  HEPARINUNFRC -- -- -- -- --  CREATININE 0.86 -- -- 0.85 --  CKTOTAL -- -- 159 -- 198  CKMB -- -- 1.8 -- 1.8  TROPONINI -- -- <0.30 -- <0.30    Estimated Creatinine Clearance: 102.5 ml/min (by C-G formula based on Cr of 0.86).   Medical History: Past Medical History  Diagnosis Date  . Dementia   . History of blood clots   . Acid reflux   . A-fib   . Shortness of breath     Assessment: 71 y.o. M on warfarin PTA for hx DVT and admitted with a therapeutic INR on home dose of warfarin 3 mg daily. Warfarin was held and reversed with Vit K 10 meQ IV on 7/22 for procedure today. The patient is now s/p I&D + poly-exchanged of infected L hip and warfarin has been ordered to resume. INR 1.27, no CBC today -- stable from labs drawn on 7/22. Will give higher dose than PTA today in the setting of held doses and residual effects of Vitamin K administration.  Goal of Therapy:  INR 2-3   Plan:  1. Warfarin 5 mg x 1 dose at 1800 today 2. Consider bridging with low-dose lovenox until INR therapeutic 3. Will continue to monitor for any signs/symptoms of bleeding and will follow up with PT/INR in the a.m.   Georgina Pillion, PharmD, BCPS Clinical Pharmacist Pager: [phone] 09/20/2011 8:33 PM
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
## 2011-09-20 NOTE — Progress Notes (Signed)
Hyponatremia and hypokalemia are due to high dose HCTZ. Oral potassium replacement ordered and HCTZ discontinued.  Platelet count 130,000 on SQ lovenox which may need to be stopped and pneumatic compression device applied to the lower extremities until coumadin can be restarted.

## 2011-09-20 NOTE — Anesthesia Procedure Notes (Signed)
Procedure Name: Intubation Date/Time: 09/20/2011 4:31 PM Performed by: Gwenyth Allegra Pre-anesthesia Checklist: Emergency Drugs available, Patient identified, Timeout performed, Suction available and Patient being monitored Patient Re-evaluated:Patient Re-evaluated prior to inductionOxygen Delivery Method: Circle system utilized Preoxygenation: Pre-oxygenation with 100% oxygen Intubation Type: IV induction and Rapid sequence Laryngoscope Size: Mac and 3 Grade View: Grade III Tube type: Oral Tube size: 7.5 mm Number of attempts: 1 Airway Equipment and Method: Stylet Placement Confirmation: ETT inserted through vocal cords under direct vision,  breath sounds checked- equal and bilateral and positive ETCO2 Secured at: 22 cm Tube secured with: Tape Dental Injury: Teeth and Oropharynx as per pre-operative assessment

## 2011-09-20 NOTE — Anesthesia Postprocedure Evaluation (Signed)
  Anesthesia Post-op Note  Patient: Ricky Rodriguez  Procedure(s) Performed: Procedure(s) (LRB): IRRIGATION AND DEBRIDEMENT HIP WITH POLY EXCHANGE (Left)  Patient Location: PACU  Anesthesia Type: General  Level of Consciousness: awake  Airway and Oxygen Therapy: Patient Spontanous Breathing  Post-op Pain: mild  Post-op Assessment: Post-op Vital signs reviewed, Patient's Cardiovascular Status Stable, Respiratory Function Stable, Patent Airway, No signs of Nausea or vomiting and Pain level controlled  Post-op Vital Signs: stable  Complications: No apparent anesthesia complications

## 2011-09-20 NOTE — Transfer of Care (Signed)
Immediate Anesthesia Transfer of Care Note  Patient: Ricky Rodriguez  Procedure(s) Performed: Procedure(s) (LRB): IRRIGATION AND DEBRIDEMENT HIP WITH POLY EXCHANGE (Left)  Patient Location: PACU  Anesthesia Type: General  Level of Consciousness: sedated  Airway & Oxygen Therapy: Patient Spontanous Breathing and Patient connected to face mask oxygen  Post-op Assessment: Report given to PACU RN and Post -op Vital signs reviewed and stable  Post vital signs: Reviewed and stable  Complications: No apparent anesthesia complications

## 2011-09-20 NOTE — Progress Notes (Signed)
Surgical drainage of the soft tissue abscess around the left hip is scheduled for this afternoon. HCTZ stopped due to hypokalemia and hyponatremia. I will recheck BMET tomorrow. Post op patient can be transferred to orthopedic floor. He does not need telemetry. Restart coumadin post op.

## 2011-09-20 NOTE — Preoperative (Signed)
Beta Blockers   Reason not to administer Beta Blockers:Not Applicable 

## 2011-09-21 ENCOUNTER — Encounter (HOSPITAL_COMMUNITY): Payer: Self-pay | Admitting: Orthopaedic Surgery

## 2011-09-21 ENCOUNTER — Inpatient Hospital Stay (HOSPITAL_COMMUNITY): Payer: BC Managed Care – PPO

## 2011-09-21 DIAGNOSIS — Y831 Surgical operation with implant of artificial internal device as the cause of abnormal reaction of the patient, or of later complication, without mention of misadventure at the time of the procedure: Secondary | ICD-10-CM

## 2011-09-21 DIAGNOSIS — T8579XA Infection and inflammatory reaction due to other internal prosthetic devices, implants and grafts, initial encounter: Secondary | ICD-10-CM

## 2011-09-21 DIAGNOSIS — I82403 Acute embolism and thrombosis of unspecified deep veins of lower extremity, bilateral: Secondary | ICD-10-CM | POA: Diagnosis present

## 2011-09-21 DIAGNOSIS — I10 Essential (primary) hypertension: Secondary | ICD-10-CM | POA: Diagnosis present

## 2011-09-21 LAB — CBC
MCH: 29.6 pg (ref 26.0–34.0)
MCHC: 34.1 g/dL (ref 30.0–36.0)
Platelets: 343 10*3/uL (ref 150–400)
RDW: 14.1 % (ref 11.5–15.5)

## 2011-09-21 LAB — BASIC METABOLIC PANEL
BUN: 12 mg/dL (ref 6–23)
Calcium: 8.4 mg/dL (ref 8.4–10.5)
Chloride: 96 mEq/L (ref 96–112)
Creatinine, Ser: 0.82 mg/dL (ref 0.50–1.35)
GFR calc Af Amer: >90 mL/min (ref >90)

## 2011-09-21 LAB — PROTIME-INR: Prothrombin Time: 15.3 seconds — ABNORMAL HIGH (ref 11.6–15.2)

## 2011-09-21 MED ORDER — SODIUM CHLORIDE 0.9 % IJ SOLN
10.0000 mL | Freq: Two times a day (BID) | INTRAMUSCULAR | Status: DC
  Administered 2011-09-24: 10 mL

## 2011-09-21 MED ORDER — VANCOMYCIN HCL 1000 MG IV SOLR
1500.0000 mg | Freq: Once | INTRAVENOUS | Status: AC
  Administered 2011-09-21: 1500 mg via INTRAVENOUS
  Filled 2011-09-21: qty 1500

## 2011-09-21 MED ORDER — RIFAMPIN 300 MG PO CAPS
600.0000 mg | ORAL_CAPSULE | Freq: Every day | ORAL | Status: DC
  Administered 2011-09-21 – 2011-09-26 (×6): 600 mg via ORAL
  Filled 2011-09-21 (×6): qty 2

## 2011-09-21 MED ORDER — VANCOMYCIN HCL 1000 MG IV SOLR
1500.0000 mg | Freq: Two times a day (BID) | INTRAVENOUS | Status: DC
  Filled 2011-09-21 (×2): qty 1500

## 2011-09-21 MED ORDER — SODIUM CHLORIDE 0.9 % IJ SOLN
10.0000 mL | INTRAMUSCULAR | Status: DC | PRN
  Administered 2011-09-23 – 2011-09-26 (×8): 10 mL

## 2011-09-21 MED ORDER — WARFARIN SODIUM 4 MG PO TABS
4.0000 mg | ORAL_TABLET | Freq: Once | ORAL | Status: AC
  Administered 2011-09-21: 4 mg via ORAL
  Filled 2011-09-21: qty 1

## 2011-09-21 NOTE — Clinical Social Work Placement (Addendum)
    Clinical Social Work Department CLINICAL SOCIAL WORK PLACEMENT NOTE 09/21/2011  Patient:  TIQUAN, BOUCH  Account Number:  [id number] Admit date:  09/16/2011  Clinical Social Worker:  Margaree Mackintosh  Date/time:  09/21/2011 03:16 PM  Clinical Social Work is seeking post-discharge placement for this patient at the following level of care:   SKILLED NURSING   (*CSW will update this form in Epic as items are completed)   09/21/2011  Patient/family provided with [facility] Department of Clinical Social Work's list of facilities offering this level of care within the geographic area requested by the patient (or if unable, by the patient's family).  09/21/2011  Patient/family informed of their freedom to choose among providers that offer the needed level of care, that participate in Medicare, Medicaid or managed care program needed by the patient, have an available bed and are willing to accept the patient.  09/21/2011  Patient/family informed of [facility]' ownership interest in [facility], as well as of the fact that they are under no obligation to receive care at this facility.  PASARR submitted to EDS on 09/21/2011 PASARR number received from EDS on 09/22/2011  (DTC)  FL2 transmitted to all facilities in geographic area requested by pt/family on  09/21/2011 FL2 transmitted to all facilities within larger geographic area on   Patient informed that his/her managed care company has contracts with or will negotiate with  certain facilities, including the following:     Patient/family informed of bed offers received:  09/22/11 ([facility])  09/25/2011  (DTC) Patient chooses bed at [facility] ([facility])   [facility] (DTC) Physician recommends and patient chooses bed at    Patient to be transferred to [facility] on  09/26/11 Patient to be transferred to facility by [facility] with his wife  The following physician request were entered in  Epic:   Additional Comments: Patient and wife requested placement at [facility] but they do not accept commercial BCBS.  Further discussion with patient and wife- they requested placement at [facility] in [place], [place] as patient has been a resident in the past at this facility.  Referral initiated and bed offer given once [facility] authorization was given. Patient and wife were appreciative;  Nursing and SNF notified of d/c plan.  Lorri Frederick. [place] Pugh  [phone]

## 2011-09-21 NOTE — Evaluation (Signed)
Physical Therapy Evaluation Patient Details Name: Ricky Rodriguez MRN: [id number] DOB: 11-01-1940 Today's Date: 09/21/2011 Time: 4098-1191 PT Time Calculation (min): 28 min  PT Assessment / Plan / Recommendation Clinical Impression  PAtient s/p left hip I&D with posterior hip precautions.  Decr mobility secondary to decr endurance and lethargy today.  Needs continued PT.      PT Assessment  Patient needs continued PT services    Follow Up Recommendations  Skilled nursing facility;Supervision/Assistance - 24 hour    Barriers to Discharge        Equipment Recommendations  None recommended by PT    Recommendations for Other Services     Frequency Min 5X/week    Precautions / Restrictions Precautions Precautions: Fall;Posterior Hip Precaution Booklet Issued: Yes (comment) Restrictions Weight Bearing Restrictions: Yes LLE Weight Bearing: Weight bearing as tolerated   Pertinent Vitals/Pain 102 HR, 122/59 BP, No pain per pt      Mobility  Bed Mobility Bed Mobility: Rolling Right;Right Sidelying to Sit;Sitting - Scoot to [place] of Bed Rolling Right: 2: Max assist;With rail Right Sidelying to Sit: 2: Max assist;With rails;HOB elevated Sitting - Scoot to [place] of Bed: 3: Mod assist Details for Bed Mobility Assistance: PAtient needed cues for technique for supine to sit and needed to use pad to scoot patient to EOB.   Transfers Transfers: Sit to Stand;Stand to Sit;Stand Pivot Transfers Sit to Stand: 3: Mod assist;From elevated surface;With upper extremity assist;From bed Stand to Sit: 3: Mod assist;With upper extremity assist;To chair/3-in-1 Stand Pivot Transfers: 3: Mod assist Details for Transfer Assistance: Patient had a lot of difficulty attaining upright stance and never quite got fully upright.  Patient had incr weight on his right LE to keep weight off of his left LE.  Patient was able to perfom a stand pivot with RW to the chair.   Ambulation/Gait Ambulation/Gait  Assistance: Not tested (comment) Stairs: No Wheelchair Mobility Wheelchair Mobility: No         PT Diagnosis: Difficulty walking  PT Problem List: Decreased activity tolerance;Decreased mobility;Decreased balance;Decreased knowledge of use of DME;Decreased safety awareness;Decreased range of motion;Decreased knowledge of precautions PT Treatment Interventions: DME instruction;Gait training;Functional mobility training;Balance training;Therapeutic exercise;Therapeutic activities;Patient/family education   PT Goals Acute Rehab PT Goals PT Goal Formulation: With patient Time For Goal Achievement: 10/05/11 Potential to Achieve Goals: Good Pt will go Supine/Side to Sit: with supervision PT Goal: Supine/Side to Sit - Progress: Goal set today Pt will Sit at Edge of Bed: with supervision;3-5 min;with no upper extremity support PT Goal: Sit at Edge Of Bed - Progress: Goal set today Pt will go Sit to Stand: with upper extremity assist;with min assist PT Goal: Sit to Stand - Progress: Goal set today Pt will Ambulate: with supervision;with least restrictive assistive device;16 - 50 feet PT Goal: Ambulate - Progress: Goal set today Pt will Go Up / Down Stairs: 3-5 stairs;with min assist;with least restrictive assistive device PT Goal: Up/Down Stairs - Progress: Goal set today  Visit Information  Last PT Received On: 09/21/11 Assistance Needed: +2    Subjective Data  Subjective: "I would like to get up." Patient Stated Goal: To go home   Prior Functioning  Home Living Lives With: Spouse Available Help at Discharge: Family;Available 24 hours/day Type of Home: House Home Access: Stairs to enter [employer] of Steps: 4 Entrance Stairs-Rails: Can reach both Home Layout: One level Bathroom Shower/Tub: Health visitor: Standard Home Adaptive Equipment: Shower chair with back;Bedside commode/3-in-1;Walker - rolling;Wheelchair -  manual Prior Function Level of  Independence: Independent with assistive device(s) Able to Take Stairs?: Yes Driving: Yes Vocation: Retired Musician: No difficulties    Cognition  Overall Cognitive Status: Impaired Area of Impairment: Attention;Following commands;Safety/judgement;Problem solving Arousal/Alertness: Lethargic (somewhat lethargic) Orientation Level: Appears intact for tasks assessed Behavior During Session: Lethargic Current Attention Level: Focused Attention - Other Comments: for up to 30 seconds Following Commands: Follows one step commands with increased time Safety/Judgement: Decreased awareness of safety precautions;Decreased safety judgement for tasks assessed    Extremity/Trunk Assessment Right Upper Extremity Assessment RUE ROM/Strength/Tone: WFL for tasks assessed RUE Sensation: WFL - Light Touch RUE Coordination: WFL - gross/fine motor Left Upper Extremity Assessment LUE ROM/Strength/Tone: WFL for tasks assessed LUE Sensation: WFL - Light Touch LUE Coordination: WFL - gross/fine motor Right Lower Extremity Assessment RLE ROM/Strength/Tone: WFL for tasks assessed RLE Sensation: WFL - Light Touch RLE Coordination: WFL - gross/fine motor Left Lower Extremity Assessment LLE ROM/Strength/Tone: Deficits LLE ROM/Strength/Tone Deficits: due to soreness per pt LLE Sensation: WFL - Light Touch LLE Coordination: WFL - gross/fine motor   Balance Balance Balance Assessed: Yes Static Sitting Balance Static Sitting - Balance Support: No upper extremity supported;Feet supported Static Sitting - Level of Assistance: 3: Mod assist Static Sitting - Comment/# of Minutes: 2 minutes at EOB, leaning too much to right  End of Session PT - End of Session Equipment Utilized During Treatment: Gait belt Activity Tolerance: Patient limited by fatigue Patient left: in chair;with call bell/phone within reach Nurse Communication: Mobility status;Precautions;Weight bearing status        INGOLD,Athens Lebeau 09/21/2011, 11:29 AM  [facility] [phone] (pager)

## 2011-09-21 NOTE — Progress Notes (Signed)
The patient appears comfortable following surgery yesterday. Appreciate Dr Blair Dolphin recommendations. 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 PICC line will be placed this afternoon; continuous IV fluids can be stopped post PICC line placement. Patient can be transferred to the orthopedic floor without telemetry needed during the remainder of his hospitalizaton.

## 2011-09-21 NOTE — Clinical Social Work Psychosocial (Signed)
     Clinical Social Work Department BRIEF PSYCHOSOCIAL ASSESSMENT 09/21/2011  Patient:  KINGSTEN, ENFIELD     Account Number:  [id number]     Admit date:  09/16/2011  Clinical Social Worker:  Margaree Mackintosh  Date/Time:  09/21/2011 03:11 PM  Referred by:  Physician  Date Referred:  09/20/2011 Referred for  SNF Placement   Other Referral:   Interview type:  Family Other interview type:   (Wife-Lynn)    PSYCHOSOCIAL DATA Living Status:  FAMILY Admitted from facility:   Level of care:   Primary support name:  [phone] Primary support relationship to patient:  SPOUSE Degree of support available:   Adequate.    CURRENT CONCERNS Current Concerns  Post-Acute Placement   Other Concerns:    SOCIAL WORK ASSESSMENT / PLAN Clinical Social Worker recieved referral indicating pt may benefit from post acute rehab to assist with pt obtaining PT goals leading to increased safety and independence.  CSW reviewed chart and met with spouse at bedside; pt currently sleeping and does not awake to name being called.  CSW introduced self, explained role, and offered support.  CSW reviewed PT recommendations.  Wife agreeable to SNF with main interests in [facility] and [facility].  If these SNFs are not available, wife is interested in pt returning to [facility]F in [place], [place].  Pt previously attended [facility] for ST-SNF.  Additionally, pt has an MD at [facility]  CSW reviewed SNF process.  CSW submitted appropirate information for SNF search.  CSW to continue to follow and assist as needed.   Assessment/plan status:  Psychosocial Support/Ongoing Assessment of Needs Other assessment/ plan:   Information/referral to community resources:   SNF    PATIENTS/FAMILYS RESPONSE TO PLAN OF CARE: Pt currently asleep and does not awake to his name being called. Wife was pleasant and engaged in conversation. Wife seemd to speak openly.  Wife thanked CSW for intervention.

## 2011-09-21 NOTE — Progress Notes (Addendum)
ANTIBIOTIC/ANTICOAGULATION - Follow Up Consult  Pharmacy Consult for coumadin/vancomycin Indication: Coumadin for h/o DVT and vanco/zosyn for hip infection  No Known Allergies  Patient Measurements: Height: 6\' 1"  (185.4 cm) Weight: 235 lb 0.2 oz (106.6 kg) IBW/kg (Calculated) : 79.9  Heparin Dosing Weight:   Vital Signs: Temp: 98.1 F (36.7 C) (07/25 0413) Temp src: Oral (07/25 0413) BP: 114/59 mmHg (07/25 0500) Pulse Rate: 86  (07/25 0636)  Labs:  Basename 09/21/11 0400 09/20/11 1604 09/20/11 0124 09/19/11 0355  HGB 12.0* 13.3 -- --  HCT 35.2* 39.0 -- --  PLT 343 -- -- --  APTT -- -- -- --  LABPROT 15.3* -- 16.2* 19.4*  INR 1.18 -- 1.27 1.61*  HEPARINUNFRC -- -- -- --  CREATININE 0.82 -- 0.86 --  CKTOTAL -- -- -- --  CKMB -- -- -- --  TROPONINI -- -- -- --   Labs:  Basename 09/21/11 0400 09/20/11 1604 09/20/11 0124  WBC 12.5* -- --  HGB 12.0* 13.3 --  PLT 343 -- --  LABCREA -- -- --  CREATININE 0.82 -- 0.86   Estimated Creatinine Clearance: 105.9 ml/min (by C-G formula based on Cr of 0.82).  Basename 09/19/11 1129  VANCOTROUGH 8.0*  VANCOPEAK --  Drue Dun --  GENTTROUGH --  GENTPEAK --  GENTRANDOM --  TOBRATROUGH --  TOBRAPEAK --  TOBRARND --  AMIKACINPEAK --  AMIKACINTROU --  AMIKACIN --     Microbiology: Recent Results (from the past 720 hour(s))  CULTURE, BLOOD (ROUTINE X 2)     Status: Normal (Preliminary result)   Collection Time   09/16/11  3:10 PM      Component Value Range Status Comment   Specimen Description BLOOD RIGHT ARM   Final    Special Requests BOTTLES DRAWN AEROBIC AND ANAEROBIC 5CC EACH   Final    Culture  Setup Time 09/16/2011 20:44   Final    Culture     Final    Value:        BLOOD CULTURE RECEIVED NO GROWTH TO DATE CULTURE WILL BE HELD FOR 5 DAYS BEFORE ISSUING A FINAL NEGATIVE REPORT   Report Status PENDING   Incomplete   CULTURE, BLOOD (ROUTINE X 2)     Status: Normal (Preliminary result)   Collection Time   09/16/11  3:20 PM      Component Value Range Status Comment   Specimen Description BLOOD LEFT ARM   Final    Special Requests BOTTLES DRAWN AEROBIC AND ANAEROBIC 5CC EACH   Final    Culture  Setup Time 09/16/2011 20:44   Final    Culture     Final    Value:        BLOOD CULTURE RECEIVED NO GROWTH TO DATE CULTURE WILL BE HELD FOR 5 DAYS BEFORE ISSUING A FINAL NEGATIVE REPORT   Report Status PENDING   Incomplete   URINE CULTURE     Status: Normal   Collection Time   09/16/11  4:20 PM      Component Value Range Status Comment   Specimen Description URINE, CLEAN CATCH   Final    Special Requests NONE   Final    Culture  Setup Time 09/17/2011 11:30   Final    Colony Count NO GROWTH   Final    Culture NO GROWTH   Final    Report Status 09/18/2011 FINAL   Final   MRSA PCR SCREENING     Status: Normal   Collection Time  09/16/11  8:44 PM      Component Value Range Status Comment   MRSA by PCR NEGATIVE  NEGATIVE Final   GRAM STAIN     Status: Normal   Collection Time   09/18/11  2:00 PM      Component Value Range Status Comment   Specimen Description SYNOVIAL FLUID HIP LEFT   Final    Special Requests NONE   Final    Gram Stain     Final    Value: ABUNDANT WBC PRESENT,BOTH PMN AND MONONUCLEAR     FEW GRAM POSITIVE COCCI IN PAIRS IN CLUSTERS   Report Status 09/18/2011 FINAL   Final   BODY FLUID CULTURE     Status: Normal (Preliminary result)   Collection Time   09/18/11  2:00 PM      Component Value Range Status Comment   Specimen Description SYNOVIAL FLUID HIP LEFT   Final    Special Requests 6CC   Final    Gram Stain     Final    Value: ABUNDANT WBC PRESENT,BOTH PMN AND MONONUCLEAR     FEW GRAM POSITIVE COCCI IN PAIRS     IN CLUSTERS   Culture NO GROWTH 1 DAY   Final    Report Status PENDING   Incomplete   SURGICAL PCR SCREEN     Status: Normal   Collection Time   09/20/11  7:26 AM      Component Value Range Status Comment   MRSA, PCR NEGATIVE  NEGATIVE Final    Staphylococcus  aureus NEGATIVE  NEGATIVE Final   WOUND CULTURE     Status: Normal (Preliminary result)   Collection Time   09/20/11  5:21 PM      Component Value Range Status Comment   Specimen Description WOUND HIP LEFT   Final    Special Requests PATIENT ON FOLLOWING VANCOMYCIN   Final    Gram Stain     Final    Value: FEW WBC PRESENT,BOTH PMN AND MONONUCLEAR     NO SQUAMOUS EPITHELIAL CELLS SEEN     NO ORGANISMS SEEN   Culture PENDING   Incomplete    Report Status PENDING   Incomplete   ANAEROBIC CULTURE     Status: Normal (Preliminary result)   Collection Time   09/20/11  5:24 PM      Component Value Range Status Comment   Specimen Description WOUND HIP LEFT   Final    Special Requests PATIENT ON FOLLOWING VANCOMYCIN   Final    Gram Stain     Final    Value: FEW WBC PRESENT,BOTH PMN AND MONONUCLEAR     NO SQUAMOUS EPITHELIAL CELLS SEEN     NO ORGANISMS SEEN   Culture PENDING   Incomplete    Report Status PENDING   Incomplete     Anti-infectives     Start     Dose/Rate Route Frequency Ordered Stop   09/20/11 1714   gentamicin (GARAMYCIN) injection  Status:  Discontinued          As needed 09/20/11 1715 09/20/11 1810   09/20/11 1713   vancomycin (VANCOCIN) powder  Status:  Discontinued          As needed 09/20/11 1714 09/20/11 1810   09/20/11 1600   vancomycin (VANCOCIN) powder 1,000 mg  Status:  Discontinued        1,000 mg Other To Surgery 09/20/11 1555 09/20/11 2022   09/17/11 1000   azithromycin (ZITHROMAX) 500 mg in  dextrose 5 % 250 mL IVPB  Status:  Discontinued        500 mg 250 mL/hr over 60 Minutes Intravenous Every 24 hours 09/17/11 0825 09/20/11 0605   09/17/11 0000   vancomycin (VANCOCIN) IVPB 1000 mg/200 mL premix  Status:  Discontinued        1,000 mg 200 mL/hr over 60 Minutes Intravenous Every 12 hours 09/16/11 2230 09/20/11 2022   09/16/11 2230  piperacillin-tazobactam (ZOSYN) IVPB 3.375 g       3.375 g 12.5 mL/hr over 240 Minutes Intravenous 3 times per day 09/16/11  2219     09/16/11 1630  piperacillin-tazobactam (ZOSYN) IVPB 3.375 g       3.375 g 12.5 mL/hr over 240 Minutes Intravenous  Once 09/16/11 1619 09/16/11 2153   09/16/11 1630   vancomycin (VANCOCIN) IVPB 1000 mg/200 mL premix        1,000 mg 200 mL/hr over 60 Minutes Intravenous  Once 09/16/11 1619 09/16/11 1737          Assessment: 71 YOM presenting with cough, fever and SOB.  Started on empiric antibiotics for pneumonia and Left THA infection (fluid collection noted). 7/24 underwent I&D and poly-exchange of THA with placement of antibiotic beads. Zosyn D#5. Appears vancomycin was stopped by ortho post-op, resumed to am by ID.  He received Vit K 10mg  pre-op to reverse INR for surgery. Warfarin resumed last night. INR = 1.18 this am.  Warfarin 5mg  given last night, home dose is 3mg  daily (higher dose given d/t possible Vit K resistance).  CBC stable. 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    7/23: vancomycin trough = 8 mcg/ml on 1gm IV q12h (prior to 7th dose)  07/20 blood x2 >>NGTD 07/20 ucx (UA neg)>> NG 7/22 Hip synovial fluid >>NGTD (gram stain-few GPC pairs/clusters) - growing CoNS per ID 7/24 L hip wound >> NGTD  07/20 Zosyn>> 07/20 vancomycin >>7/24 7/21 azithromycin >>7/24  Goal of Therapy:  Vancomycin trough level 15-20 mcg/ml INR 2-3  Plan:  1. Coumadin 4mg  PO x1 tonight 2. Daily INR 3. Vancomycin 1500mg  IV q12hrs, f/u with steady state trough, sat/sun. Rifampin added for prosthetic joint infection  Dannielle Huh 09/21/2011,8:38 AM
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
## 2011-09-22 ENCOUNTER — Ambulatory Visit: Payer: BC Managed Care – PPO | Admitting: Physical Therapy

## 2011-09-22 DIAGNOSIS — T8579XA Infection and inflammatory reaction due to other internal prosthetic devices, implants and grafts, initial encounter: Secondary | ICD-10-CM

## 2011-09-22 DIAGNOSIS — Y831 Surgical operation with implant of artificial internal device as the cause of abnormal reaction of the patient, or of later complication, without mention of misadventure at the time of the procedure: Secondary | ICD-10-CM

## 2011-09-22 LAB — CULTURE, BLOOD (ROUTINE X 2)
Culture: NO GROWTH
Culture: NO GROWTH

## 2011-09-22 LAB — WOUND CULTURE: Culture: NO GROWTH

## 2011-09-22 LAB — CBC
Hemoglobin: 11.1 g/dL — ABNORMAL LOW (ref 13.0–17.0)
MCH: 29.9 pg (ref 26.0–34.0)
MCHC: 34.4 g/dL (ref 30.0–36.0)
MCV: 87.1 fL (ref 78.0–100.0)
Platelets: 337 10*3/uL (ref 150–400)
RBC: 3.71 MIL/uL — ABNORMAL LOW (ref 4.22–5.81)

## 2011-09-22 LAB — BODY FLUID CULTURE

## 2011-09-22 MED ORDER — DEXTROSE 5 % IV SOLN
2.0000 g | INTRAVENOUS | Status: DC
  Administered 2011-09-22 – 2011-09-25 (×4): 2 g via INTRAVENOUS
  Filled 2011-09-22 (×6): qty 2

## 2011-09-22 MED ORDER — WARFARIN SODIUM 4 MG PO TABS
4.0000 mg | ORAL_TABLET | Freq: Once | ORAL | Status: AC
  Administered 2011-09-22: 4 mg via ORAL
  Filled 2011-09-22: qty 1

## 2011-09-22 NOTE — Progress Notes (Signed)
Patient has an order for transfer to 5N -06.at 1110. Report called to 5N and the secretary stated that the nurse is not available to take report at this time and requested if she can call me back.  1130 .  I called back and the secretary stated that patient is reassigned to another room 16 and it is still in the process of being cleaned and they will call me as soon as the room is ready. 1245.  Called the unit again and report given to [facility].  Dr Laural Benes called and stated to wait for him before transferring patient. 1300. Dr. Laural Benes came for his rounds and spoke to the patient. Patient is responsive.  I called the wife, Ricky Rodriguez and left her a message that patient is going to room 5N-room 16. 1310. Patient left the unit to be transferred to 5N Room 16.

## 2011-09-22 NOTE — Progress Notes (Signed)
Physical Therapy Treatment Patient Details Name: Ricky Rodriguez MRN: [id number] DOB: 1940/03/15 Today's Date: 09/22/2011 Time: 5621-3086 PT Time Calculation (min): 12 min  PT Assessment / Plan / Recommendation Comments on Treatment Session  Patient s/p left hip I&D with decr mobility secondary to self limiting his mobility today.  Will benefit from PT to address safety, endurance and balance issues.  Today, patient refused to do anything but go to bed.    Follow Up Recommendations  Skilled nursing facility;Supervision/Assistance - 24 hour    Barriers to Discharge        Equipment Recommendations  None recommended by PT    Recommendations for Other Services    Frequency Min 5X/week   Plan Discharge plan remains appropriate;Frequency remains appropriate    Precautions / Restrictions Precautions Precautions: Fall;Posterior Hip Precaution Booklet Issued: Yes (comment) Precaution Comments: Patient could not recall any of 3 hip precautions, posted precautions in room Restrictions Weight Bearing Restrictions: Yes LLE Weight Bearing: Weight bearing as tolerated   Pertinent Vitals/Pain VSS, Some pain    Mobility  Bed Mobility Bed Mobility: Sit to Supine Rolling Right: Not tested (comment) Right Sidelying to Sit: Not tested (comment) Sitting - Scoot to Edge of Bed: Not tested (comment) Sit to Supine: 3: Mod assist Details for Bed Mobility Assistance: Needed mod assist for LEs to get back in bed with mod cues for technique Transfers Transfers: Sit to Stand;Stand to Sit Sit to Stand: 1: +2 Total assist;With upper extremity assist;With armrests;From chair/3-in-1 (chair somewhat low) Sit to Stand: Patient Percentage: 60% Stand to Sit: 4: Min assist;With upper extremity assist;To bed Stand Pivot Transfers: Not tested (comment) Details for Transfer Assistance: cues needed for hand placement and for anterior translation of hips over knees.  Also needed cues for left LEplacement with sit  to stand to sit as patient did not remember to slide LE out for hip precautions.  Patient more upright than yesterday but still flexed and leaning slightly to right >left.   Ambulation/Gait Ambulation/Gait Assistance: 1: +2 Total assist Ambulation/Gait: Patient Percentage: 70% Ambulation Distance (Feet): 20 Feet Assistive device: Rolling walker Ambulation/Gait Assistance Details: Patient needed constant cues for sequencing steps and RW.  PAtient keeps left LE somewhat behind him as he ambulates, needing cues for sequencing and safety.  Patient also ambulating on toes on left LE.  Takes a quick step with the right to decr his weight shift onto his left LE.   Gait Pattern: Step-to pattern;Decreased stride length;Shuffle;Trunk flexed;Lateral trunk lean to right Gait velocity: Decreased General Gait Details: Continues to not stand upright.  Needs constant cues for safety.Somewhat impulsive at times.   Stairs: No Corporate treasurer: No    PT Goals Acute Rehab PT Goals PT Goal: Supine/Side to Sit - Progress: Progressing toward goal PT Goal: Sit at Edge Of Bed - Progress: Progressing toward goal PT Goal: Sit to Stand - Progress: Progressing toward goal PT Goal: Ambulate - Progress: Progressing toward goal  Visit Information  Last PT Received On: 09/22/11 Assistance Needed: +2 PT/OT Co-Evaluation/Treatment: Yes    Subjective Data  Subjective: "I want to go to bed."   Cognition  Overall Cognitive Status: Impaired Area of Impairment: Attention;Following commands;Safety/judgement;Problem solving Arousal/Alertness: Awake/alert Orientation Level: Appears intact for tasks assessed Behavior During Session: Flat affect Current Attention Level: Focused Following Commands: Follows one step commands with increased time Safety/Judgement: Decreased awareness of safety precautions;Decreased safety judgement for tasks assessed Safety/Judgement - Other Comments: Poor safety with  use of RW  Balance  Static Sitting Balance Static Sitting - Balance Support: No upper extremity supported;Feet supported Static Sitting - Level of Assistance: 5: Stand by assistance Static Sitting - Comment/# of Minutes: 2 minutes, still leaning right but able to sit up on EOB today.  End of Session PT - End of Session Equipment Utilized During Treatment: Gait belt Activity Tolerance: Patient limited by fatigue (Patient refused to do anything but go to bed.) Patient left: in bed;with call bell/phone within reach Nurse Communication: Mobility status;Precautions;Weight bearing status       INGOLD,Jerriann Schrom 09/22/2011, 12:36 PM  [facility] [phone] (pager)

## 2011-09-22 NOTE — Progress Notes (Addendum)
Subjective: 2 Days Post-Op Procedure(s) (LRB): IRRIGATION AND DEBRIDEMENT HIP WITH POLY EXCHANGE (Left) Patient reports pain as mild.    Objective: Vital signs in last 24 hours: Temp:  [97.3 F (36.3 C)-99.4 F (37.4 C)] 98.1 F (36.7 C) (07/26 0737) Pulse Rate:  [86-91] 91  (07/26 0737) Resp:  [16-30] 28  (07/26 0737) BP: (115-144)/(61-80) 125/70 mmHg (07/26 0737) SpO2:  [93 %-96 %] 96 % (07/26 0737) Weight:  [109 kg (240 lb 4.8 oz)] 109 kg (240 lb 4.8 oz) (07/26 0019)  Intake/Output from previous day: 07/25 0701 - 07/26 0700 In: 2915 [P.O.:840; I.V.:1575; IV Piggyback:500] Out: 1980 [Urine:1850; Drains:130] Intake/Output this shift:     Basename 09/22/11 0545 09/21/11 0400 09/20/11 1604  HGB 11.1* 12.0* 13.3    Basename 09/22/11 0545 09/21/11 0400  WBC 10.2 12.5*  RBC 3.71* 4.06*  HCT 32.3* 35.2*  PLT 337 343    Basename 09/21/11 0400 09/20/11 1604 09/20/11 0124  NA 131* 135 --  K 3.4* 3.3* --  CL 96 -- 93*  CO2 25 -- 22  BUN 12 -- 15  CREATININE 0.82 -- 0.86  GLUCOSE 108* 102* --  CALCIUM 8.4 -- 8.3*    Basename 09/22/11 0545 09/21/11 0400  LABPT -- --  INR 1.53* 1.18    transfer to Ortho floor if OK with Dr. Laural Benes. cont PT  Assessment/Plan: 2 Days Post-Op Procedure(s) (LRB): IRRIGATION AND DEBRIDEMENT HIP WITH POLY EXCHANGE (Left) Continue ABX therapy due to Post-op infection   All cultures neg so far.  Agree with Dr. Lovina Reach recommendations.   Kimble Hitchens C 09/22/2011, 8:30 AM

## 2011-09-22 NOTE — Progress Notes (Signed)
Orthopedic Tech Progress Note Patient Details:  Ricky Rodriguez 06-01-1940 [id number]  Patient ID: Ricky Rodriguez, male   DOB: 07/10/1940, 71 y.o.   MRN: [id number] Viewed order from doctor's order list  Nikki Dom 09/22/2011, 3:34 PM

## 2011-09-22 NOTE — Progress Notes (Signed)
Patient ID: [name], male   DOB: 1940/08/07, 71 y.o.   MRN: [id number] The patient remains alert and afebrile on antibiotic therapy for  his left hip infection. He receives ceftriaxone through the PICC line and oral rifampin. He will be transferred to the orthopedic floor today for physical therapy under the direction of Dr Annell Greening. He is on a regular diet and does not require maintenance IV fluids. Telemetry will be discontinued at transfer. He receives abilify for his chronic frontotemporal dementia. He remains on chronic coumadin therapy for his chronic DVT of both legs and pharmacy in managing the dose.

## 2011-09-22 NOTE — Progress Notes (Addendum)
ANTIBIOTIC/ANTICOAGULATION - Follow Up Consult  Pharmacy Consult for coumadin/vancomycin Indication: Coumadin for h/o DVT and vanco for hip infection  No Known Allergies  Patient Measurements: Height: 6\' 1"  (185.4 cm) Weight: 240 lb 4.8 oz (109 kg) IBW/kg (Calculated) : 79.9  Heparin Dosing Weight:   Vital Signs: Temp: 98.1 F (36.7 C) (07/26 0737) Temp src: Axillary (07/26 0737) BP: 125/70 mmHg (07/26 0737) Pulse Rate: 91  (07/26 0737)  Labs:  Basename 09/22/11 0545 09/21/11 0400 09/20/11 1604 09/20/11 0124  HGB 11.1* 12.0* -- --  HCT 32.3* 35.2* 39.0 --  PLT 337 343 -- --  APTT -- -- -- --  LABPROT 18.7* 15.3* -- 16.2*  INR 1.53* 1.18 -- 1.27  HEPARINUNFRC -- -- -- --  CREATININE -- 0.82 -- 0.86  CKTOTAL -- -- -- --  CKMB -- -- -- --  TROPONINI -- -- -- --   Labs:  Basename 09/22/11 0545 09/21/11 0400 09/20/11 1604 09/20/11 0124  WBC 10.2 12.5* -- --  HGB 11.1* 12.0* 13.3 --  PLT 337 343 -- --  LABCREA -- -- -- --  CREATININE -- 0.82 -- 0.86   Estimated Creatinine Clearance: 106.9 ml/min (by C-G formula based on Cr of 0.82).  Basename 09/19/11 1129  VANCOTROUGH 8.0*  VANCOPEAK --  Drue Dun --  GENTTROUGH --  GENTPEAK --  GENTRANDOM --  TOBRATROUGH --  TOBRAPEAK --  TOBRARND --  AMIKACINPEAK --  AMIKACINTROU --  AMIKACIN --     Microbiology: Recent Results (from the past 720 hour(s))  CULTURE, BLOOD (ROUTINE X 2)     Status: Normal   Collection Time   09/16/11  3:10 PM      Component Value Range Status Comment   Specimen Description BLOOD RIGHT ARM   Final    Special Requests BOTTLES DRAWN AEROBIC AND ANAEROBIC [facility] EACH   Final    Culture  Setup Time 09/16/2011 20:44   Final    Culture NO GROWTH 5 DAYS   Final    Report Status 09/22/2011 FINAL   Final   CULTURE, BLOOD (ROUTINE X 2)     Status: Normal   Collection Time   09/16/11  3:20 PM      Component Value Range Status Comment   Specimen Description BLOOD LEFT ARM   Final    Special  Requests BOTTLES DRAWN AEROBIC AND ANAEROBIC [facility] EACH   Final    Culture  Setup Time 09/16/2011 20:44   Final    Culture NO GROWTH 5 DAYS   Final    Report Status 09/22/2011 FINAL   Final   URINE CULTURE     Status: Normal   Collection Time   09/16/11  4:20 PM      Component Value Range Status Comment   Specimen Description URINE, CLEAN CATCH   Final    Special Requests NONE   Final    Culture  Setup Time 09/17/2011 11:30   Final    Colony Count NO GROWTH   Final    Culture NO GROWTH   Final    Report Status 09/18/2011 FINAL   Final   MRSA PCR SCREENING     Status: Normal   Collection Time   09/16/11  8:44 PM      Component Value Range Status Comment   MRSA by PCR NEGATIVE  NEGATIVE Final   GRAM STAIN     Status: Normal   Collection Time   09/18/11  2:00 PM      Component  Value Range Status Comment   Specimen Description SYNOVIAL FLUID HIP LEFT   Final    Special Requests NONE   Final    Gram Stain     Final    Value: ABUNDANT WBC PRESENT,BOTH PMN AND MONONUCLEAR     FEW GRAM POSITIVE COCCI IN PAIRS IN CLUSTERS   Report Status 09/18/2011 FINAL   Final   BODY FLUID CULTURE     Status: Normal (Preliminary result)   Collection Time   09/18/11  2:00 PM      Component Value Range Status Comment   Specimen Description SYNOVIAL FLUID HIP LEFT   Final    Special Requests 6CC   Final    Gram Stain     Final    Value: ABUNDANT WBC PRESENT,BOTH PMN AND MONONUCLEAR     FEW GRAM POSITIVE COCCI IN PAIRS     IN CLUSTERS   Culture NO GROWTH 1 DAY   Final    Report Status PENDING   Incomplete   SURGICAL PCR SCREEN     Status: Normal   Collection Time   09/20/11  7:26 AM      Component Value Range Status Comment   MRSA, PCR NEGATIVE  NEGATIVE Final    Staphylococcus aureus NEGATIVE  NEGATIVE Final   WOUND CULTURE     Status: Normal   Collection Time   09/20/11  5:21 PM      Component Value Range Status Comment   Specimen Description WOUND HIP LEFT   Final    Special Requests PATIENT ON  FOLLOWING VANCOMYCIN   Final    Gram Stain     Final    Value: FEW WBC PRESENT,BOTH PMN AND MONONUCLEAR     NO SQUAMOUS EPITHELIAL CELLS SEEN     NO ORGANISMS SEEN   Culture NO GROWTH 2 DAYS   Final    Report Status 09/22/2011 FINAL   Final   ANAEROBIC CULTURE     Status: Normal (Preliminary result)   Collection Time   09/20/11  5:24 PM      Component Value Range Status Comment   Specimen Description WOUND HIP LEFT   Final    Special Requests PATIENT ON FOLLOWING VANCOMYCIN   Final    Gram Stain     Final    Value: FEW WBC PRESENT,BOTH PMN AND MONONUCLEAR     NO SQUAMOUS EPITHELIAL CELLS SEEN     NO ORGANISMS SEEN   Culture     Final    Value: NO ANAEROBES ISOLATED; CULTURE IN PROGRESS FOR 5 DAYS   Report Status PENDING   Incomplete     Anti-infectives     Start     Dose/Rate Route Frequency Ordered Stop   09/22/11 1000   vancomycin (VANCOCIN) 1,500 mg in sodium chloride 0.9 % 500 mL IVPB        1,500 mg 250 mL/hr over 120 Minutes Intravenous Every 12 hours 09/21/11 1109     09/21/11 1230   vancomycin (VANCOCIN) 1,500 mg in sodium chloride 0.9 % 500 mL IVPB        1,500 mg 250 mL/hr over 120 Minutes Intravenous  Once 09/21/11 1109 09/21/11 1600   09/21/11 1200   rifampin (RIFADIN) capsule 600 mg        600 mg Oral Daily 09/21/11 1056     09/20/11 1714   gentamicin (GARAMYCIN) injection  Status:  Discontinued          As needed 09/20/11 1715 09/20/11  1810   09/20/11 1713   vancomycin (VANCOCIN) powder  Status:  Discontinued          As needed 09/20/11 1714 09/20/11 1810   09/20/11 1600   vancomycin (VANCOCIN) powder 1,000 mg  Status:  Discontinued        1,000 mg Other To Surgery 09/20/11 1555 09/20/11 2022   09/17/11 1000   azithromycin (ZITHROMAX) 500 mg in dextrose 5 % 250 mL IVPB  Status:  Discontinued        500 mg 250 mL/hr over 60 Minutes Intravenous Every 24 hours 09/17/11 0825 09/20/11 0605   09/17/11 0000   vancomycin (VANCOCIN) IVPB 1000 mg/200 mL premix   Status:  Discontinued        1,000 mg 200 mL/hr over 60 Minutes Intravenous Every 12 hours 09/16/11 2230 09/20/11 2022   09/16/11 2230   piperacillin-tazobactam (ZOSYN) IVPB 3.375 g  Status:  Discontinued        3.375 g 12.5 mL/hr over 240 Minutes Intravenous 3 times per day 09/16/11 2219 09/21/11 1053   09/16/11 1630  piperacillin-tazobactam (ZOSYN) IVPB 3.375 g       3.375 g 12.5 mL/hr over 240 Minutes Intravenous  Once 09/16/11 1619 09/16/11 2153   09/16/11 1630   vancomycin (VANCOCIN) IVPB 1000 mg/200 mL premix        1,000 mg 200 mL/hr over 60 Minutes Intravenous  Once 09/16/11 1619 09/16/11 1737          Assessment: 71 YOM presenting with cough, fever and SOB.  Started on empiric antibiotics for pneumonia and Left THA infection. On 7/24 underwent I&D and poly-exchange of THA with placement of antibiotic beads. On vancomycin IV, zosyn stopped 7/25 by ID.  He received Vit K 10mg  pre-op to reverse INR for surgery. Warfarin resumed 7/24 PM. INR = 1.53 this am. Home dose is 3mg  daily (higher dose given d/t possible Vit K resistance).  CBC reveals Hgb trending down - may be d/t ABLA s/p surgery. 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    7/23: vancomycin trough = 8 mcg/ml on 1gm IV q12h (prior to 7th dose).    07/20 blood x2 >>NGTD 07/20 ucx (UA neg)>> NG 7/22 Hip synovial fluid >>Methcillin sensitive Coag-neg staph 7/24 L hip wound >> NGTD  07/20 Zosyn>>7/25 07/20 vancomycin >>7/24 (stopped post-op), 7/25>> 7/21 azithromycin >>7/24  Goal of Therapy:  Vancomycin trough level 15-20 mcg/ml INR 2-3  Plan:  1. Coumadin 4mg  PO x1 tonight 2. Daily INR 3. Continue Vancomycin 1500mg  IV q12hrs, f/u with steady state trough, Sunday am 4. Rifampin added for prosthetic joint infection, may require increase doses of coumadin d/t CYP-450 induction.  Can also reduce levels of Abilify.   Dannielle Huh 09/22/2011,8:34 AM

## 2011-09-22 NOTE — Progress Notes (Signed)
Patient transferred to [facility] [place]. Report provided by Angelia Mould, LCSWA regarding patient. Bed search in place and FL2 on chart.  Patient's wife had requested [facility]- however- I spoke to [place] at [facility] who indicated that they do NOT accept patient's insurance (he is commercial [employer]  She has discussed with patient's wife.  Bed search continues.  Lorri Frederick. [place]  [phone]

## 2011-09-22 NOTE — Evaluation (Signed)
Occupational Therapy Evaluation Patient Details Name: Ricky Rodriguez MRN: [id number] DOB: 28-Nov-1940 Today's Date: 09/22/2011 Time: 7829-5621 OT Time Calculation (min): 12 min  OT Assessment / Plan / Recommendation Clinical Impression  71 yr old male admitted for left hip I & D and poly exchange of the prosthetic component.  Now needs increased assistance with basic selfcare tasks compared to baseline.  Will benefit acute OT services to address these issues with follow-up recommendations of SNF level at discharge.    OT Assessment  Patient needs continued OT Services    Follow Up Recommendations  Skilled nursing facility    Barriers to Discharge Decreased caregiver support    Equipment Recommendations  Defer to next venue       Frequency  Min 2X/week    Precautions / Restrictions Precautions Precautions: Fall;Posterior Hip Precaution Booklet Issued: Yes (comment) Precaution Comments: Patient could not recall any of 3 hip precautions, posted precautions in room Restrictions Weight Bearing Restrictions: No LLE Weight Bearing: Weight bearing as tolerated   Pertinent Vitals/Pain Pat did not respond when asked about pain,  Faces scale appears to be 2    ADL  Eating/Feeding: Simulated;Set up Where Assessed - Eating/Feeding: Edge of bed Grooming: Simulated;Set up Where Assessed - Grooming: Unsupported sitting Upper Body Bathing: Simulated;Set up Where Assessed - Upper Body Bathing: Unsupported sitting Lower Body Bathing: Simulated;+2 Total assistance Lower Body Bathing: Patient Percentage: 30% Where Assessed - Lower Body Bathing: Supported sit to stand Upper Body Dressing: Simulated;Set up Where Assessed - Upper Body Dressing: Unsupported sitting Lower Body Dressing: +2 Total assistance Lower Body Dressing: Patient Percentage: 30% Where Assessed - Lower Body Dressing: Supported sit to stand Toilet Transfer: Simulated;+2 Total assistance Toilet Transfer: Patient Percentage:  30% Statistician Method: Surveyor, minerals: Materials engineer and Hygiene: Simulated;+2 Total assistance Toileting - Architect and Hygiene: Patient Percentage: 30% Where Assessed - Engineer, mining and Hygiene: Sit to stand from 3-in-1 or toilet Tub/Shower Transfer Method: Not assessed Transfers/Ambulation Related to ADLs: Pt able to ambulate with RW with total assist +2 (pt 30%).  Needs max instructional cueing for sequencing walker and steps.  Pt tends to walk without attempting to place his left heel on the floor during stance on the left side. ADL Comments: Pt with limited participation secondary to wanting to get back in bed from the bedside chair.  Would not participate in further activity.      OT Diagnosis: Generalized weakness;Cognitive deficits;Acute pain  OT Problem List: Decreased strength;Decreased activity tolerance;Impaired balance (sitting and/or standing);Decreased knowledge of use of DME or AE;Decreased knowledge of precautions;Decreased cognition;Decreased safety awareness OT Treatment Interventions: Self-care/ADL training;Therapeutic activities;DME and/or AE instruction;Balance training;Patient/family education   OT Goals Acute Rehab OT Goals OT Goal Formulation: With patient Time For Goal Achievement: 10/06/11 Potential to Achieve Goals: Good ADL Goals Pt Will Perform Grooming: with supervision;Standing at sink;Supported ADL Goal: Grooming - Progress: Goal set today Pt Will Perform Lower Body Bathing: with min assist;Sit to stand from bed;Sit to stand from chair;with adaptive equipment ADL Goal: Lower Body Bathing - Progress: Goal set today Pt Will Perform Lower Body Dressing: with supervision;Sit to stand from chair;Sit to stand from bed;with adaptive equipment ADL Goal: Lower Body Dressing - Progress: Goal set today Pt Will Transfer to Toilet: with supervision;with DME;3-in-1 ADL Goal:  Toilet Transfer - Progress: Goal set today Pt Will Perform Toileting - Clothing Manipulation: with supervision;Sitting on 3-in-1 or toilet;Standing ADL Goal: Toileting - Clothing Manipulation -  Progress: Goal set today Pt Will Perform Toileting - Hygiene: with supervision;Sit to stand from 3-in-1/toilet ADL Goal: Toileting - Hygiene - Progress: Goal set today  Visit Information  Last OT Received On: 09/22/11 Assistance Needed: +2    Subjective Data  Subjective: "I want to lay down." Patient Stated Goal: To get back to the bed.   Prior Functioning  Vision/Perception  Home Living Lives With: Spouse Available Help at Discharge: Family;Available 24 hours/day Type of Home: House Home Access: Stairs to enter [employer] of Steps: 4 Entrance Stairs-Rails: Can reach both Home Layout: One level Bathroom Shower/Tub: Health visitor: [facility] Home Adaptive Equipment: Shower chair with back;Bedside commode/3-in-1;Walker - rolling;Wheelchair - manual Prior Function Level of Independence: Independent with assistive device(s) Able to Take Stairs?: Yes Driving: Yes Vocation: Retired Musician: No difficulties   Vision - Assessment Eye Alignment: Within Chemical engineer Perception: Within Functional Limits Praxis Praxis: Intact  Cognition  Overall Cognitive Status: Impaired Area of Impairment: Safety/judgement;Following commands Arousal/Alertness: Awake/alert Orientation Level: Appears intact for tasks assessed Behavior During Session: Flat affect Current Attention Level: Sustained Following Commands: Follows one step commands with increased time Safety/Judgement: Decreased awareness of safety precautions;Decreased safety judgement for tasks assessed Safety/Judgement - Other Comments: Poor safety with use of RW    Extremity/Trunk Assessment Right Upper Extremity Assessment RUE ROM/Strength/Tone: Within functional levels RUE  Sensation: WFL - Light Touch RUE Coordination: WFL - gross/fine motor Left Upper Extremity Assessment LUE ROM/Strength/Tone: Within functional levels LUE Sensation: WFL - Light Touch LUE Coordination: WFL - gross/fine motor Trunk Assessment Trunk Assessment: Kyphotic   Mobility Bed Mobility Bed Mobility: Sit to Supine Rolling Right: Not tested (comment) Right Sidelying to Sit: Not tested (comment) Sitting - Scoot to Edge of Bed: Not tested (comment) Sit to Supine: 3: Mod assist;HOB flat Details for Bed Mobility Assistance: Needed mod assist for LEs to get back in bed with mod cues for technique Transfers Transfers: Sit to Stand Sit to Stand: 1: +2 Total assist;Without upper extremity assist;From bed Sit to Stand: Patient Percentage: 30% Stand to Sit: 4: Min assist;With upper extremity assist;To bed Details for Transfer Assistance: cues needed for hand placement and for anterior translation of hips over knees.  Also needed cues for left LEplacement with sit to stand to sit as patient did not remember to slide LE out for hip precautions.  Patient more upright than yesterday but still flexed and leaning slightly to right >left.        Balance Balance Balance Assessed: Yes Static Sitting Balance Static Sitting - Balance Support: No upper extremity supported;Feet supported Static Sitting - Level of Assistance: 5: Stand by assistance Static Sitting - Comment/# of Minutes: 2 minutes, still leaning right but able to sit up on EOB today. Static Standing Balance Static Standing - Balance Support: Right upper extremity supported;Left upper extremity supported Static Standing - Level of Assistance: 3: Mod assist Dynamic Standing Balance Dynamic Standing - Balance Support: Right upper extremity supported;Left upper extremity supported Dynamic Standing - Level of Assistance: 2: Max assist  End of Session OT - End of Session Activity Tolerance: Other (comment) (Pt self limiting, wanting to get in  the bed.) Patient left: in bed     Chandan Fly OTR/L Pager number [phone] 09/22/2011, 1:07 PM

## 2011-09-22 NOTE — Progress Notes (Signed)
Orthopedic Tech Progress Note Patient Details:  Ricky Rodriguez 1940/04/15 [id number]  Patient ID: Ricky Rodriguez, male   DOB: 12/20/1940, 71 y.o.   MRN: [id number] Trapeze bar patient helper  Nikki Dom 09/22/2011, 3:34 PM

## 2011-09-22 NOTE — Progress Notes (Signed)
Patient ID: Ricky Rodriguez, male   DOB: [birth year], 71 y.o.   MRN: [id number]    [facility] for Infectious Disease    Date of Admission:  09/16/2011           Day 7 antibiotics Principal Problem:  *Prosthetic joint infection of left hip Active Problems:  Dementia  DVT, bilateral lower limbs  Hypertension goal BP (blood pressure) < 140/90      . ARIPiprazole  3 mg Oral Daily  . B-complex with vitamin C  1 tablet Oral Daily  . cholecalciferol  400 Units Oral Daily  . docusate sodium  200 mg Oral QHS  . pantoprazole  20 mg Oral Q1200  . potassium chloride  8 mEq Oral BID  . rifampin  600 mg Oral Daily  . sodium chloride  10-40 mL Intracatheter Q12H  . sodium chloride  3 mL Intravenous Q12H  . vancomycin  1,500 mg Intravenous Once  . vancomycin  1,500 mg Intravenous Q12H  . vitamin C  500 mg Oral Daily  . warfarin  4 mg Oral ONCE-1800  . warfarin  4 mg Oral ONCE-1800  . Warfarin - Pharmacist Dosing Inpatient   Does not apply q1800  . DISCONTD: piperacillin-tazobactam (ZOSYN)  IV  3.375 g Intravenous Q8H    Subjective: He denies having any left hip pain  Objective: Temp:  [97.3 F (36.3 C)-99.4 F (37.4 C)] 98.1 F (36.7 C) (07/26 0737) Pulse Rate:  [86-91] 91  (07/26 0737) Resp:  [16-30] 28  (07/26 0737) BP: (115-144)/(63-80) 125/70 mmHg (07/26 0737) SpO2:  [93 %-96 %] 96 % (07/26 0737) Weight:  [109 kg (240 lb 4.8 oz)] 109 kg (240 lb 4.8 oz) (07/26 0019)  General: He is alert, comfortable and sitting up in a chair Skin: Right arm PICC site appears normal  Lab Results Lab Results  Component Value Date   WBC 10.2 09/22/2011   HGB 11.1* 09/22/2011   HCT 32.3* 09/22/2011   MCV 87.1 09/22/2011   PLT 337 09/22/2011    Lab Results  Component Value Date   CREATININE 0.82 09/21/2011   BUN 12 09/21/2011   NA 131* 09/21/2011   K 3.4* 09/21/2011   CL 96 09/21/2011   CO2 25 09/21/2011    Lab Results  Component Value Date   ALT 37 09/20/2011   AST 29 09/20/2011   ALKPHOS  115 09/20/2011   BILITOT 0.8 09/20/2011      Microbiology: Recent Results (from the past 240 hour(s))  CULTURE, BLOOD (ROUTINE X 2)     Status: Normal   Collection Time   09/16/11  3:10 PM      Component Value Range Status Comment   Specimen Description BLOOD RIGHT ARM   Final    Special Requests BOTTLES DRAWN AEROBIC AND ANAEROBIC [facility] EACH   Final    Culture  Setup Time 09/16/2011 20:44   Final    Culture NO GROWTH 5 DAYS   Final    Report Status 09/22/2011 FINAL   Final   CULTURE, BLOOD (ROUTINE X 2)     Status: Normal   Collection Time   09/16/11  3:20 PM      Component Value Range Status Comment   Specimen Description BLOOD LEFT ARM   Final    Special Requests BOTTLES DRAWN AEROBIC AND ANAEROBIC [facility] EACH   Final    Culture  Setup Time 09/16/2011 20:44   Final    Culture NO GROWTH 5 DAYS   Final  Report Status 09/22/2011 FINAL   Final   URINE CULTURE     Status: Normal   Collection Time   09/16/11  4:20 PM      Component Value Range Status Comment   Specimen Description URINE, CLEAN CATCH   Final    Special Requests NONE   Final    Culture  Setup Time 09/17/2011 11:30   Final    Colony Count NO GROWTH   Final    Culture NO GROWTH   Final    Report Status 09/18/2011 FINAL   Final   MRSA PCR SCREENING     Status: Normal   Collection Time   09/16/11  8:44 PM      Component Value Range Status Comment   MRSA by PCR NEGATIVE  NEGATIVE Final   GRAM STAIN     Status: Normal   Collection Time   09/18/11  2:00 PM      Component Value Range Status Comment   Specimen Description SYNOVIAL FLUID HIP LEFT   Final    Special Requests NONE   Final    Gram Stain     Final    Value: ABUNDANT WBC PRESENT,BOTH PMN AND MONONUCLEAR     FEW GRAM POSITIVE COCCI IN PAIRS IN CLUSTERS   Report Status 09/18/2011 FINAL   Final   BODY FLUID CULTURE     Status: Normal   Collection Time   09/18/11  2:00 PM      Component Value Range Status Comment   Specimen Description SYNOVIAL FLUID HIP LEFT    Final    Special Requests 6CC   Final    Gram Stain     Final    Value: ABUNDANT WBC PRESENT,BOTH PMN AND MONONUCLEAR     FEW GRAM POSITIVE COCCI IN PAIRS     IN CLUSTERS   Culture     Final    Value: RARE STAPHYLOCOCCUS SPECIES (COAGULASE NEGATIVE)     Note: RIFAMPIN AND GENTAMICIN SHOULD NOT BE USED AS SINGLE DRUGS FOR TREATMENT OF STAPH INFECTIONS.   Report Status 09/22/2011 FINAL   Final    Organism ID, Bacteria STAPHYLOCOCCUS SPECIES (COAGULASE NEGATIVE)   Final   SURGICAL PCR SCREEN     Status: Normal   Collection Time   09/20/11  7:26 AM      Component Value Range Status Comment   MRSA, PCR NEGATIVE  NEGATIVE Final    Staphylococcus aureus NEGATIVE  NEGATIVE Final   WOUND CULTURE     Status: Normal   Collection Time   09/20/11  5:21 PM      Component Value Range Status Comment   Specimen Description WOUND HIP LEFT   Final    Special Requests PATIENT ON FOLLOWING VANCOMYCIN   Final    Gram Stain     Final    Value: FEW WBC PRESENT,BOTH PMN AND MONONUCLEAR     NO SQUAMOUS EPITHELIAL CELLS SEEN     NO ORGANISMS SEEN   Culture NO GROWTH 2 DAYS   Final    Report Status 09/22/2011 FINAL   Final   ANAEROBIC CULTURE     Status: Normal (Preliminary result)   Collection Time   09/20/11  5:24 PM      Component Value Range Status Comment   Specimen Description WOUND HIP LEFT   Final    Special Requests PATIENT ON FOLLOWING VANCOMYCIN   Final    Gram Stain     Final    Value: FEW  WBC PRESENT,BOTH PMN AND MONONUCLEAR     NO SQUAMOUS EPITHELIAL CELLS SEEN     NO ORGANISMS SEEN   Culture     Final    Value: NO ANAEROBES ISOLATED; CULTURE IN PROGRESS FOR 5 DAYS   Report Status PENDING   Incomplete     Studies/Results: Dg Chest Port 1 View  09/21/2011  *RADIOLOGY REPORT*  Clinical Data: PICC line placement for infected left hip prosthesis.  PORTABLE CHEST - 1 VIEW  Comparison: 09/17/2011  Findings: Right upper extremity PICC line is present that appears to extend well into the right  atrium.  It is recommended that the catheter be withdrawn by approximately 4 cm.  Lungs show improved aeration compared to the prior study with no evidence of edema or consolidation.  Stable cardiomegaly present.  IMPRESSION: The PICC line extends well into the right atrium.  Recommend retraction by approximately 4 cm.  Original Report Authenticated By: Reola Calkins, M.D.    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 I will treat him with high dose IV ceftriaxone and oral rifampin for 5 more weeks and then convert over to an oral regimen for another 6 weeks in an attempt to cure this infection and salvage his hip prosthesis.  Plan: 1. Change vancomycin to ceftriaxone 2 g IV daily along with oral rifampin through August 30. 2. I will arrange followup in my clinic within the next 4 weeks 3. Please call if I can be of further assistance while he is here  Cliffton Asters, MD [facility] for Infectious Disease [facility] [phone] pager   [phone] cell 09/22/2011, 10:30 AM

## 2011-09-23 DIAGNOSIS — T8579XA Infection and inflammatory reaction due to other internal prosthetic devices, implants and grafts, initial encounter: Secondary | ICD-10-CM

## 2011-09-23 LAB — CBC
Hemoglobin: 11.6 g/dL — ABNORMAL LOW (ref 13.0–17.0)
MCH: 29.2 pg (ref 26.0–34.0)
MCV: 85.9 fL (ref 78.0–100.0)
RBC: 3.97 MIL/uL — ABNORMAL LOW (ref 4.22–5.81)

## 2011-09-23 LAB — GLUCOSE, CAPILLARY: Glucose-Capillary: 100 mg/dL — ABNORMAL HIGH (ref 70–99)

## 2011-09-23 MED ORDER — WARFARIN SODIUM 6 MG PO TABS
6.0000 mg | ORAL_TABLET | Freq: Once | ORAL | Status: AC
  Administered 2011-09-23: 6 mg via ORAL
  Filled 2011-09-23: qty 1

## 2011-09-23 MED ORDER — ENOXAPARIN SODIUM 40 MG/0.4ML ~~LOC~~ SOLN
40.0000 mg | SUBCUTANEOUS | Status: DC
  Administered 2011-09-24 – 2011-09-25 (×2): 40 mg via SUBCUTANEOUS
  Filled 2011-09-23 (×4): qty 0.4

## 2011-09-23 NOTE — Progress Notes (Signed)
PT-INR remains subtherapeutic on coumadin. SQ lovenox 40 MG Q24hours restarted until PT-INR therapeutic on coumadin.

## 2011-09-23 NOTE — Progress Notes (Signed)
Physical Therapy Treatment Patient Details Name: Ricky Rodriguez MRN: [id number] DOB: 10-28-1940 Today's Date: 09/23/2011 Time: 4098-1191 PT Time Calculation (min): 25 min  PT Assessment / Plan / Recommendation Comments on Treatment Session  Pt with very flat affect & has difficulty following cues & difficulty problem solving.  Pt's wife states pt has early stages of dementia but dementia has progressed with recent surgeries.  Cont to feel pt would best benefit from SNF to maximize functional recovery.      Follow Up Recommendations  Skilled nursing facility;Supervision/Assistance - 24 hour    Barriers to Discharge        Equipment Recommendations  Defer to next venue    Recommendations for Other Services    Frequency Min 5X/week   Plan Discharge plan remains appropriate;Frequency remains appropriate    Precautions / Restrictions Precautions Precautions: Fall;Posterior Hip Precaution Comments: Pt able to verbalize 2/3 hip precautions.  Reviewed all 3 precautions Restrictions Weight Bearing Restrictions: Yes LLE Weight Bearing: Weight bearing as tolerated       Mobility  Bed Mobility Bed Mobility: Supine to Sit Supine to Sit: 3: Mod assist Sitting - Scoot to Edge of Bed: 4: Min assist;Other (comment) (use of draw pad) Details for Bed Mobility Assistance: Cues for sequencing, technique, & reinforcement of hip precautions.  Assist for LE's, & to bring shoulders/trunk to sitting upright.  Pt's wife states pt sat up on EOB by himself in the middle of the night.   Transfers Transfers: Sit to Stand;Stand to Sit Sit to Stand: 1: +2 Total assist;With upper extremity assist;From bed;With armrests;From chair/3-in-1 Sit to Stand: Patient Percentage: 30% Stand to Sit: 3: Mod assist;With upper extremity assist;With armrests;To chair/3-in-1 Details for Transfer Assistance: Cues for technique, safety, tall posture.  Assist to achieve standing, anterior translation of trunk over BOS, balance,  RW stabilization, controlled descent, & safety.  Pt does not fully extend knees, flexes at hips, leans forward, & heels not on floor.   Ambulation/Gait Ambulation/Gait Assistance: 1: +2 Total assist Ambulation/Gait: Patient Percentage: 70% Ambulation Distance (Feet): 15 Feet Assistive device: Rolling walker Ambulation/Gait Assistance Details: Max cues to have pt place foot flat on floor during stance phase.  Cues for sequencing, tall posture, RW management, & safety.     Pt ambulates on toes of feet.  Pt's wife reports pt has not been able to achieve full knee extension for years.   Gait Pattern: Step-to pattern;Decreased stride length;Shuffle;Trunk flexed;Lateral trunk lean to right General Gait Details: Continues to not stand upright.  Needs constant cues for safety.Somewhat impulsive at times.   Stairs: No Wheelchair Mobility Wheelchair Mobility: No      PT Goals Acute Rehab PT Goals Time For Goal Achievement: 10/05/11 Potential to Achieve Goals: Good PT Goal: Supine/Side to Sit - Progress: Progressing toward goal PT Goal: Sit to Stand - Progress: Not progressing PT Goal: Ambulate - Progress: Not progressing  Visit Information  Last PT Received On: 09/23/11 Assistance Needed: +2    Subjective Data      Cognition  Overall Cognitive Status: Impaired Area of Impairment: Following commands;Problem solving;Safety/judgement Arousal/Alertness: Awake/alert Orientation Level: Appears intact for tasks assessed Behavior During Session: Flat affect Following Commands: Follows one step commands with increased time Safety/Judgement: Decreased awareness of safety precautions;Decreased safety judgement for tasks assessed Problem Solving: Required max directional cueing for mobilty.  Cognition - Other Comments: Pt's wife present & reports pt with beginning stages of dementia but since recent surgeries, cognition has delined.  Balance  Balance Balance Assessed: No  End of Session PT  - End of Session Equipment Utilized During Treatment: Gait belt Activity Tolerance: Patient limited by fatigue Patient left: in chair;with call bell/phone within reach;with family/visitor present Nurse Communication: Mobility status    Verdell Face, [place] [phone] 09/23/2011

## 2011-09-23 NOTE — Progress Notes (Signed)
Subjective: 3 Days Post-Op Procedure(s) (LRB): IRRIGATION AND DEBRIDEMENT HIP WITH POLY EXCHANGE (Left) Patient reports pain as 6 on 0-10 scale.    Objective: Vital signs in last 24 hours: Temp:  [97.5 F (36.4 C)-98.4 F (36.9 C)] 97.5 F (36.4 C) (07/27 0553) Pulse Rate:  [81-89] 88  (07/27 0553) Resp:  [14-21] 20  (07/27 0800) BP: (136-153)/(65-76) 153/76 mmHg (07/27 0553) SpO2:  [92 %-97 %] 95 % (07/27 0553)  Intake/Output from previous day: 07/26 0701 - 07/27 0700 In: 153 [I.V.:153] Out: 250 [Urine:250] Intake/Output this shift:     Basename 09/23/11 0500 09/22/11 0545 09/21/11 0400 09/20/11 1604  HGB 11.6* 11.1* 12.0* 13.3    Basename 09/23/11 0500 09/22/11 0545  WBC 9.0 10.2  RBC 3.97* 3.71*  HCT 34.1* 32.3*  PLT 394 337    Basename 09/21/11 0400 09/20/11 1604  NA 131* 135  K 3.4* 3.3*  CL 96 --  CO2 25 --  BUN 12 --  CREATININE 0.82 --  GLUCOSE 108* 102*  CALCIUM 8.4 --    Basename 09/23/11 0500 09/22/11 0545  LABPT -- --  INR 1.63* 1.53*  Culture from 7/22 MSSA  Neurologically intact ABD soft Intact pulses distally Dorsiflexion/Plantar flexion intact Incision: dressing C/D/I  Assessment/Plan: 3 Days Post-Op Procedure(s) (LRB): IRRIGATION AND DEBRIDEMENT HIP WITH POLY EXCHANGE (Left)  Advance diet Up with therapy Continue ABX therapy due to Post-op infection  Ricky Rodriguez,Ricky Rodriguez 09/23/2011, 11:33 AM

## 2011-09-23 NOTE — Progress Notes (Signed)
Subjective: Patient with prosthetic hip infection.  Sitting up in a chair. No complaints. Seems comfortable.  Objective: Lab: Lab Results  Component Value Date   WBC 9.0 09/23/2011   HGB 11.6* 09/23/2011   HCT 34.1* 09/23/2011   MCV 85.9 09/23/2011   PLT 394 09/23/2011   BMET    Component Value Date/Time   NA 131* 09/21/2011 0400   K 3.4* 09/21/2011 0400   CL 96 09/21/2011 0400   CO2 25 09/21/2011 0400   GLUCOSE 108* 09/21/2011 0400   BUN 12 09/21/2011 0400   CREATININE 0.82 09/21/2011 0400   CALCIUM 8.4 09/21/2011 0400   GFRNONAA 87* 09/21/2011 0400   GFRAA >90 09/21/2011 0400  Culture- MSSA   Imaging: No new imaging  Scheduled Meds:   . ARIPiprazole  3 mg Oral Daily  . B-complex with vitamin C  1 tablet Oral Daily  . cefTRIAXone (ROCEPHIN)  IV  2 g Intravenous Q24H  . cholecalciferol  400 Units Oral Daily  . docusate sodium  200 mg Oral QHS  . pantoprazole  20 mg Oral Q1200  . potassium chloride  8 mEq Oral BID  . rifampin  600 mg Oral Daily  . sodium chloride  10-40 mL Intracatheter Q12H  . sodium chloride  3 mL Intravenous Q12H  . vitamin C  500 mg Oral Daily  . warfarin  4 mg Oral ONCE-1800  . warfarin  6 mg Oral ONCE-1800  . Warfarin - Pharmacist Dosing Inpatient   Does not apply q1800   Continuous Infusions:  PRN Meds:.acetaminophen, acetaminophen, albuterol, bisacodyl, menthol-cetylpyridinium, methocarbamol (ROBAXIN) IV, methocarbamol, morphine injection, ondansetron (ZOFRAN) IV, ondansetron, oxyCODONE, phenol, sodium chloride   Physical Exam: Filed Vitals:   09/23/11 0800  BP:   Pulse:   Temp:   Resp: 20    Intake/Output Summary (Last 24 hours) at 09/23/11 1128 Last data filed at 09/22/11 1700  Gross per 24 hour  Intake      0 ml  Output    250 ml  Net   -250 ml  Pulm - normal respirations Cor- RRR Abd- obese, firm, BS+ Neuro - awake and alert.      Assessment/Plan: 1. ID - prosthetic hip infection with MSSA. On high dose IV ceftriaxone  2.  Ortho- per Dr. Ophelia Charter  3. Dementia - stable.    Mikhail Palma Buster @[place]  IM@ @Call  grpPatsi Sears IM@ @(o) [phone]; [phone] 09/23/2011, 11:24 AM

## 2011-09-23 NOTE — Progress Notes (Signed)
ANTICOAGULATION CONSULT NOTE - Follow Up Consult  Pharmacy Consult for Coumadin Indication: hx of DVT  No Known Allergies  Patient Measurements: Height: 6\' 1"  (185.4 cm) Weight: 240 lb 4.8 oz (109 kg) IBW/kg (Calculated) : 79.9    Vital Signs: Temp: 97.5 F (36.4 C) (07/27 0553) BP: 153/76 mmHg (07/27 0553) Pulse Rate: 88  (07/27 0553)  Labs:  Basename 09/23/11 0500 09/22/11 0545 09/21/11 0400  HGB 11.6* 11.1* --  HCT 34.1* 32.3* 35.2*  PLT 394 337 343  APTT -- -- --  LABPROT 19.6* 18.7* 15.3*  INR 1.63* 1.53* 1.18  HEPARINUNFRC -- -- --  CREATININE -- -- 0.82  CKTOTAL -- -- --  CKMB -- -- --  TROPONINI -- -- --    Estimated Creatinine Clearance: 106.9 ml/min (by C-G formula based on Cr of 0.82).   Assessment: 71yo male admitted 7/21 with WBC elevated and temps to 103 d/t L THA infection (recent THA w/ fluid collection noted). Pt has hx of DVT following hip surgery, on Coumadin 3mg  PO daily PTA. Pt was admitted with therapeutic INR (of note, pt had elevated troponin and apparent new-onset Afib).  S/p 10mg  IV vit K pre-op on 7/24. Warfarin resumed post I&D/poly exchange of THA 7/24pm.  INR trend 1.18 > 1.53 > 1.63 with 5mg  > 4mg  > 4mg , expect slight increase in INR tomorrow. Hb 11.1 and trending up post op, plts 394. No bleeding noted.  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    Goal of Therapy:  INR 2-3 Monitor platelets by anticoagulation protocol: Yes   Plan:  - Coumadin 6mg  PO x1 tonight - Daily INR - Watch INR trend with addition of rifampin   Thank you for the consult.  Tomi Bamberger, PharmD Clinical Pharmacist Pager: [phone] Pharmacy: [phone] 09/23/2011 10:43 AM

## 2011-09-24 ENCOUNTER — Inpatient Hospital Stay (HOSPITAL_COMMUNITY): Payer: BC Managed Care – PPO

## 2011-09-24 LAB — GLUCOSE, CAPILLARY
Glucose-Capillary: 145 mg/dL — ABNORMAL HIGH (ref 70–99)
Glucose-Capillary: 114 mg/dL — ABNORMAL HIGH (ref 70–99)

## 2011-09-24 MED ORDER — POVIDONE-IODINE 10 % EX SOLN
CUTANEOUS | Status: DC | PRN
  Filled 2011-09-24: qty 118

## 2011-09-24 MED ORDER — WARFARIN SODIUM 6 MG PO TABS
6.0000 mg | ORAL_TABLET | Freq: Once | ORAL | Status: AC
  Administered 2011-09-24: 6 mg via ORAL
  Filled 2011-09-24: qty 1

## 2011-09-24 NOTE — Progress Notes (Signed)
Subjective: Awake and denies any pain or discomfort. He has had a bit of a hard time with BM on bedpan  Objective: Lab: Lab Results  Component Value Date   WBC 9.0 09/23/2011   HGB 11.6* 09/23/2011   HCT 34.1* 09/23/2011   MCV 85.9 09/23/2011   PLT 394 09/23/2011   BMET    Component Value Date/Time   NA 131* 09/21/2011 0400   K 3.4* 09/21/2011 0400   CL 96 09/21/2011 0400   CO2 25 09/21/2011 0400   GLUCOSE 108* 09/21/2011 0400   BUN 12 09/21/2011 0400   CREATININE 0.82 09/21/2011 0400   CALCIUM 8.4 09/21/2011 0400   GFRNONAA 87* 09/21/2011 0400   GFRAA >90 09/21/2011 0400  INR 1.89   Imaging: no new imaging  Scheduled Meds:   . ARIPiprazole  3 mg Oral Daily  . B-complex with vitamin C  1 tablet Oral Daily  . cefTRIAXone (ROCEPHIN)  IV  2 g Intravenous Q24H  . cholecalciferol  400 Units Oral Daily  . docusate sodium  200 mg Oral QHS  . enoxaparin (LOVENOX) injection  40 mg Subcutaneous Q24H  . pantoprazole  20 mg Oral Q1200  . potassium chloride  8 mEq Oral BID  . rifampin  600 mg Oral Daily  . sodium chloride  10-40 mL Intracatheter Q12H  . sodium chloride  3 mL Intravenous Q12H  . vitamin C  500 mg Oral Daily  . warfarin  6 mg Oral ONCE-1800  . warfarin  6 mg Oral ONCE-1800  . Warfarin - Pharmacist Dosing Inpatient   Does not apply q1800   Continuous Infusions:  PRN Meds:.acetaminophen, acetaminophen, albuterol, bisacodyl, menthol-cetylpyridinium, methocarbamol (ROBAXIN) IV, methocarbamol, ondansetron (ZOFRAN) IV, ondansetron, phenol, sodium chloride, DISCONTD:  morphine injection, DISCONTD: oxyCODONE   Physical Exam: Filed Vitals:   09/24/11 0756  BP: 152/76  Pulse: 16  Temp: 98  Resp: 16    Intake/Output Summary (Last 24 hours) at 09/24/11 1102 Last data filed at 09/24/11 0533  Gross per 24 hour  Intake    240 ml  Output   3150 ml  Net  -2910 ml   Obese white man laying in bed (on the bedpan) in no distress PUlm - good breath sounds Cor - RRR Abd - BS+ , no  guarding or rebound      Assessment/Plan: 1. ID - prosthetic hip infection on high dose ceftriaxone. No fever reported  2. Ortho- s/p surgical drainage. Plan per orthopedics  3. Dementia - stable. Wife at bedside  4. Coag - INR still subtherapeutic. Plan - continue lovenox.    Zakari Olamae Ferrara @[place]  WU@ @Call -grp Patsi Sears IM@ 09/24/2011, 10:59 AM

## 2011-09-24 NOTE — Progress Notes (Signed)
ANTICOAGULATION CONSULT NOTE - Follow Up Consult  Pharmacy Consult for Coumadin Indication: Hx DVT  No Known Allergies  Patient Measurements: Height: 6\' 1"  (185.4 cm) Weight: 240 lb 4.8 oz (109 kg) IBW/kg (Calculated) : 79.9   Vital Signs: Temp: 98 F (36.7 C) (07/28 0531) Temp src: Oral (07/28 0531) BP: 152/76 mmHg (07/28 0531) Pulse Rate: 94  (07/28 0531)  Labs:  Basename 09/24/11 0550 09/23/11 0500 09/22/11 0545  HGB -- 11.6* 11.1*  HCT -- 34.1* 32.3*  PLT -- 394 337  APTT -- -- --  LABPROT 22.0* 19.6* 18.7*  INR 1.89* 1.63* 1.53*  HEPARINUNFRC -- -- --  CREATININE -- -- --  CKTOTAL -- -- --  CKMB -- -- --  TROPONINI -- -- --    Estimated Creatinine Clearance: 106.9 ml/min (by C-G formula based on Cr of 0.82).   Assessment: 71yo male admitted 7/21 with WBC elevated and temps to 103 d/t L THA infection (recent THA w/ fluid collection noted). Pt has hx of DVT following hip surgery, on Coumadin 3mg  PO daily PTA. Pt was admitted with therapeutic INR (of note, pt had elevated troponin and apparent new-onset Afib). S/p 10mg  IV vit K pre-op on 7/24. Warfarin resumed post I&D/poly exchange of THA 7/24pm.  INR trend 1.53 > 1.63 > 1.89, expect another increase tomorrow with 6mg  dose last night. Plan SNF at discharge, + dementia. Lovenox for VTE ppx while INR subtherapeutic restarted 7/27 but pt refused injection.  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   Goal of Therapy:  INR 2-3 Monitor platelets by anticoagulation protocol: Yes   Plan:  1. Coumadin 6mg  PO x1 tonight 2. Daily INR 3. CBC with AM labs 7/30 if still inpt 4. 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   Thank you for the consult.  Tomi Bamberger, PharmD Clinical Pharmacist Pager:  [phone] Pharmacy: [phone] 09/24/2011 8:05 AM

## 2011-09-24 NOTE — Progress Notes (Addendum)
Patient ID: Ricky Rodriguez, male   DOB: 07/07/1940, 71 y.o.   MRN: [id number] Phone call from the nursing personnel patient reportedly has got a note from a bedside recliner and fell was found on the floor and landed on left hip and the left side of his head. No loss of consciousness. Was placed back into his bed able to weight-bear on the left lower extremity noted to have some increased drainage from the left hip incision site. X-rays were ordered I came in and evaluated the Mr. Topper the incision left hip is intact staple line is intact some minimal amount of serosanguineous drainage that may represent some small amount of minimal skin dehiscence superficially. No open wounds no significant areas that would require closure. I suspect that this is just along the entire length that he may have jarred the dermal closure with staples.  Reinforcement of the left hip is necessary as there is no deep or units subcutaneous dehiscence here. X-rays taken AP pelvis lateral left hip show a superior placed the left acetabular cup femoral component on the left with longstem parents stimulant antibiotic beads left hip and about the left trochanter note definite to new sign of fracture or lucency about the stem or the pelvis. Entire left femur x-ray was negative for acute fracture. Did have nursing contact of this patient's primary care physician and is attending to be sure that they felt comfortable with the him having hit his head without any loss of consciousness. Will need to have close observation to ensure that he does not go on to further falls one on the tendon. His wife normally is in the room with him but apparently she left and he decided to try get back to the bed on his own then fell. A nursing aide or r sitter them may be of some benefit here.  Fall with very superficial wound tension and some very slight increased drainage along the entire length of the incision will be treated with reinforcement and Betadine  prep or Betadine solution applied daily.

## 2011-09-24 NOTE — Progress Notes (Signed)
Responded immediately to "help" call in room 16 at approximately 1530. Found patient sitting on the floor between the lounge chair and his bed, holding on to his walker. He stated, " I was trying to get to bed, can you get me up there? ",. Thorough body check performed with the assistance of other staff nurses including the charge nurse. No visible body injury noted upon asssessment. Patient reported that he hit the left side of his head but was not specific about where his head hit. Moderate amount of bleeding noted to his left hip dressing. Dressing change performed after MD notified. All staples intact.  He remained alert and oriented to self and his environment. No loss of consciousness observed. No c/o SOB. Assisted back to bed and vitals assessed: BP 173/82.HR 108, Respirations 22, Temp 98.2 oral. O2 sat at room air 98. CBG 114. Xray of the hip ordered and done with no acute fracture noted. Tyleno 650mg  PO given for pain as ordered by MD,with relief.  Wife by bedside.

## 2011-09-24 NOTE — Progress Notes (Signed)
Pt fell at approximately 1550. Orthopedic Md was called, STAT portal AP Pelvis, lateral x-ray of Left hip was ordered. Advised to page attending MD, by Dr. Otelia Sergeant. Internal medicine MD called.  MD advised to monitor pt and no further tests are needed, by Dr. Debby Bud.  Will continue to monitor pt. Ricky Rodriguez 09/24/2011 4:22 PM

## 2011-09-24 NOTE — Progress Notes (Addendum)
Subjective: 4 Days Post-Op Procedure(s) (LRB): IRRIGATION AND DEBRIDEMENT HIP WITH POLY EXCHANGE (Left) he is awake and sitting at the bedside eating lunch [place] Patient reports pain as 4 on 0-10 scale and 5 on 0-10 scale.    Objective: Vital signs in last 24 hours: Temp:  [97.9 F (36.6 C)-98.7 F (37.1 C)] 97.9 F (36.6 C) (07/28 1329) Pulse Rate:  [93-98] 95  (07/28 1329) Resp:  [16-18] 18  (07/28 1329) BP: (148-159)/(70-76) 148/70 mmHg (07/28 1329) SpO2:  [94 %-99 %] 96 % (07/28 1329)  Intake/Output from previous day: 07/27 0701 - 07/28 0700 In: 560 [P.O.:560] Out: 3150 [Urine:3150] Intake/Output this shift: Total I/O In: 960 [P.O.:960] Out: 903 [Urine:900; Stool:3]   Basename 09/23/11 0500 09/22/11 0545  HGB 11.6* 11.1*    Basename 09/23/11 0500 09/22/11 0545  WBC 9.0 10.2  RBC 3.97* 3.71*  HCT 34.1* 32.3*  PLT 394 337   No results found for this basename: NA:2,K:2,CL:2,CO2:2,BUN:2,CREATININE:2,GLUCOSE:2,CALCIUM:2 in the last 72 hours  Basename 09/24/11 0550 09/23/11 0500  LABPT -- --  INR 1.89* 1.63*    Neurologically intact ABD soft Sensation intact distally Intact pulses distally Incision: moderate drainage Dressing was changed by nursing personnel today Assessment/Plan: 4 Days Post-Op Procedure(s) (LRB): IRRIGATION AND DEBRIDEMENT HIP WITH POLY EXCHANGE (Left)  Advance diet Up with therapy Continue ABX therapy due to Post-op infection  NITKA,[name] 09/24/2011, 3:56 PM

## 2011-09-25 LAB — ANAEROBIC CULTURE

## 2011-09-25 LAB — PROTIME-INR: Prothrombin Time: 20 seconds — ABNORMAL HIGH (ref 11.6–15.2)

## 2011-09-25 MED ORDER — RIFAMPIN 300 MG PO CAPS: 600.0000 mg | ORAL_CAPSULE | Freq: Every day | ORAL | Status: AC

## 2011-09-25 MED ORDER — ACETAMINOPHEN 325 MG PO TABS: 650.0000 mg | ORAL_TABLET | Freq: Four times a day (QID) | ORAL | Status: AC | PRN

## 2011-09-25 MED ORDER — POTASSIUM CHLORIDE CRYS ER 20 MEQ PO TBCR
EXTENDED_RELEASE_TABLET | ORAL | Status: AC
  Administered 2011-09-25: 20 meq
  Filled 2011-09-25: qty 1

## 2011-09-25 MED ORDER — DEXTROSE 5 % IV SOLN: 2.0000 g | INTRAVENOUS | Status: DC

## 2011-09-25 MED ORDER — WARFARIN SODIUM 7.5 MG PO TABS
7.5000 mg | ORAL_TABLET | Freq: Once | ORAL | Status: AC
  Administered 2011-09-25: 7.5 mg via ORAL
  Filled 2011-09-25: qty 1

## 2011-09-25 NOTE — Progress Notes (Signed)
Subjective: 5 Days Post-Op Procedure(s) (LRB): IRRIGATION AND DEBRIDEMENT HIP WITH POLY EXCHANGE (Left) Patient reports pain as 1 on 0-10 scale.    Objective: Vital signs in last 24 hours: Temp:  [97.9 F (36.6 C)-99.5 F (37.5 C)] 99.5 F (37.5 C) (07/29 0517) Pulse Rate:  [88-95] 88  (07/29 0517) Resp:  [16-20] 18  (07/29 0517) BP: (141-154)/(70-74) 154/74 mmHg (07/29 0517) SpO2:  [96 %-99 %] 96 % (07/29 0517)  Intake/Output from previous day: 07/28 0701 - 07/29 0700 In: 2680 [P.O.:2680] Out: 4703 [Urine:4700; Stool:3] Intake/Output this shift:     Basename 09/23/11 0500  HGB 11.6*    Basename 09/23/11 0500  WBC 9.0  RBC 3.97*  HCT 34.1*  PLT 394   No results found for this basename: NA:2,K:2,CL:2,CO2:2,BUN:2,CREATININE:2,GLUCOSE:2,CALCIUM:2 in the last 72 hours  Basename 09/25/11 0528 09/24/11 0550  LABPT -- --  INR 1.67* 1.89*    is transferring.  slightly better than pre-op.   reports more problems with right leg staying  on his toe which was his best leg.  CT Head  Atrophy with no acute changes after fall trying to get up out of recliner .      Assessment/Plan: 5 Days Post-Op Procedure(s) (LRB): IRRIGATION AND DEBRIDEMENT HIP WITH POLY EXCHANGE (Left) Discharge to SNF  Will need 6 wks IV ABX followed by several months of PO ABX.    Preferred [facility] where he was before.   Ciarra Braddy C 09/25/2011, 7:43 AM

## 2011-09-25 NOTE — Progress Notes (Signed)
Spoke with Reuel Boom- Admissions Director of [facility] throughout the day to discuss status of [employer] authorization. He stated this morning that he never received faxed info on Friday; thus- referral was re-faxed this morning.  As of the time of this note- [employer] authorization has not been obtained. Discussed with patient and his wife; per Dr. Laural Benes- patient is medically stable for d/c from the hospital. He will come see patient in the morning and complete d/c summary etc.  Hopefully facility will obtain [facility] authorization by tomorrow.  Patient's wife states that she cannot manage patient safely at home and thus does not have a safe dc plan for patient at this time.  CSW will continue to attempt to facilitate.  Lorri Frederick. [place]  [phone]

## 2011-09-25 NOTE — Plan of Care (Signed)
Problem: Phase III Progression Outcomes Goal: IV/normal saline lock discontinued Outcome: Not Met (add Reason) PICC line

## 2011-09-25 NOTE — Discharge Summary (Signed)
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 Chronic frontotemporal dementia #4. Chronic gastroesophageal reflux disease #5. Left hip replacement surgery at [facility] May 25, 2011 #6. Hypercholesterolemia  Discharge medications:  #1. Rifampin 600 mg by mouth daily through October 27, 2011 #2. Ceftriaxone 2 g intravenously through the PICC line daily through October 27, 2011 #3. Acetaminophen 650 mg every 6 hours as needed for pain #4. Prevacid 30 mg before breakfast each morning #5. Valium 5 mg every 24 hours as needed for pain and anxiety #6. Coumadin dose adjusted to maintain the prothrombin time INR between 2-3  Office followup: #1. The patient should followup with Dr. 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  Hospital course:  The patient is a 71 year old male who underwent left hip replacement surgery at [facility] on May 25, 2011. The patient was admitted to the hospital to treat a coagulase negative staphylococcal infection of the left hip. He underwent incision and drainage of pus around the left hip performed by Dr. Annell Greening. The patient was seen in consultation by Dr. Cliffton Asters from infectious disease who prescribed antibiotic therapy to treat the left hip infection. The patient is to receive ceftriaxone 2 g intravenously daily through October 27, 2011 plus Rifampin 600 mg orally daily through October 27, 2011. The patient will then be prescribed oral antibiotic therapy by Dr. Cliffton Asters for an additional 6 weeks of antibiotic therapy. The patient has a PIC line which requires maintenance on a daily basis by injecting a sodium chloride through the line to maintain patency.  The patient has chronic deep venous thrombosis of both legs and receives Coumadin on a  daily basis. Prior to being admitted to the hospital the patient was receiving Coumadin 3 mg daily as a maintenance dose. His Coumadin was stopped in the hospital to perform surgery. He is currently receiving a loading dose of Coumadin 7.5 mg daily. His dose of Coumadin is adjusted according to his prothrombin time INR.  The patient has chronic frontotemporal dementia and receives Abilify 3 mg daily.  The patient has chronic uncomplicated gastroesophageal reflux disease and receives Prevacid 30 mg approximately 30 minutes before breakfast each morning.  The patient is consuming a regular diet. He is receiving daily physical therapy. He is able to ambulate with assistance and  with a walker. The patient will require continued physical therapy until it is safe for him to return home.  The patient received his shingles vaccination in 2008. He should receive a tetanus booster in 2018. He should receive a Pneumovax booster in 2014.  The patient is discharged in stable medical condition.

## 2011-09-25 NOTE — Progress Notes (Signed)
Physical Therapy Treatment Patient Details Name: Ricky Rodriguez MRN: [id number] DOB: 1940-12-23 Today's Date: 09/25/2011 Time: 0936-1000 PT Time Calculation (min): 24 min  PT Assessment / Plan / Recommendation Comments on Treatment Session  Pt with flat affect and confused on situation this AM.  Pt able to recall location and year but unable to recall situation.  Pt continues to need constant cues for proper step sequence.    Follow Up Recommendations  Skilled nursing facility;Supervision/Assistance - 24 hour    Barriers to Discharge        Equipment Recommendations  Defer to next venue    Recommendations for Other Services    Frequency Min 5X/week   Plan Discharge plan remains appropriate;Frequency remains appropriate    Precautions / Restrictions Precautions Precautions: Fall;Posterior Hip Precaution Comments: Pt unable to recall 3/3 posterior hip precautions. Restrictions Weight Bearing Restrictions: Yes LLE Weight Bearing: Weight bearing as tolerated   Pertinent Vitals/Pain No c/o pain    Mobility  Bed Mobility Bed Mobility: Supine to Sit Sit to Supine: 1: +2 Total assist Sit to Supine: Patient Percentage: 20% Details for Bed Mobility Assistance: +2 (A) to elevate shoulders OOB  with max cues for proper technique.  Pt needed (A) to scoot hips to EOB and continues to lean posterior during bed mobility. Transfers Transfers: Sit to Stand;Stand to Sit Sit to Stand: 3: Mod assist;From chair/3-in-1;From bed Stand to Sit: 3: Mod assist;To chair/3-in-1;To bed Details for Transfer Assistance: (A) to initiate transfer with max cues for hand placement.  Elevated bed to (A) with trasnfer.  Pt needs step by step instructions to complete task. Ambulation/Gait Ambulation/Gait Assistance: 1: +2 Total assist Ambulation/Gait: Patient Percentage: 70% Ambulation Distance (Feet): 10 Feet Assistive device: Rolling walker Ambulation/Gait Assistance Details: +2 (A) for safety with  recliner to follow.  Pt needs constant cues to stand upright and proper step sequence.  Pt able to place weight through left LE this session.    Exercises     PT Diagnosis:    PT Problem List:   PT Treatment Interventions:     PT Goals Acute Rehab PT Goals PT Goal Formulation: With patient Time For Goal Achievement: 10/05/11 Potential to Achieve Goals: Good Pt will go Supine/Side to Sit: with supervision PT Goal: Supine/Side to Sit - Progress: Progressing toward goal Pt will Sit at Edge of Bed: with supervision;3-5 min;with no upper extremity support PT Goal: Sit at Edge Of Bed - Progress: Progressing toward goal Pt will go Sit to Stand: with upper extremity assist;with min assist PT Goal: Sit to Stand - Progress: Progressing toward goal Pt will Ambulate: with supervision;with least restrictive assistive device;16 - 50 feet PT Goal: Ambulate - Progress: Not met  Visit Information  Last PT Received On: 09/25/11 Assistance Needed: +2    Subjective Data      Cognition  Overall Cognitive Status: Impaired Area of Impairment: Following commands;Problem solving;Safety/judgement Arousal/Alertness: Awake/alert Orientation Level: Disoriented to;Time;Situation Behavior During Session: Flat affect Current Attention Level: Sustained Attention - Other Comments: for up to 30 seconds Following Commands: Follows one step commands with increased time Safety/Judgement: Decreased awareness of safety precautions;Decreased safety judgement for tasks assessed Safety/Judgement - Other Comments: Poor safety with use of RW Problem Solving: Required max directional cueing for mobilty.     Balance  Static Standing Balance Static Standing - Balance Support: Bilateral upper extremity supported Static Standing - Level of Assistance: 3: Mod assist Static Standing - Comment/# of Minutes: Pt stood x 2 to clean after  BM with total (A).  Pt stood ~3 minutes x 2 however limited due overall fatigue.  End of  Session PT - End of Session Equipment Utilized During Treatment: Gait belt Activity Tolerance: Patient limited by fatigue Patient left: in chair;with call bell/phone within reach;with family/visitor present Nurse Communication: Mobility status   GP     Manoj Enriquez 09/25/2011, 10:31 AM Jake Shark, PT DPT [phone]

## 2011-09-25 NOTE — Progress Notes (Signed)
ANTICOAGULATION CONSULT NOTE - Follow Up Consult  Pharmacy Consult for Coumadin Indication: Hx DVT  No Known Allergies  Patient Measurements: Height: 6\' 1"  (185.4 cm) Weight: 240 lb 4.8 oz (109 kg) IBW/kg (Calculated) : 79.9   Vital Signs: Temp: 99.5 F (37.5 C) (07/29 0517) BP: 154/74 mmHg (07/29 0517) Pulse Rate: 88  (07/29 0517)  Labs:  Alvira Philips 09/25/11 0528 09/24/11 0550 09/23/11 0500  HGB -- -- 11.6*  HCT -- -- 34.1*  PLT -- -- 394  APTT -- -- --  LABPROT 20.0* 22.0* 19.6*  INR 1.67* 1.89* 1.63*  HEPARINUNFRC -- -- --  CREATININE -- -- --  CKTOTAL -- -- --  CKMB -- -- --  TROPONINI -- -- --    Estimated Creatinine Clearance: 106.9 ml/min (by C-G formula based on Cr of 0.82).   Assessment: 71yo male admitted 7/21 with WBC elevated and temps to 103 d/t L THA infection (recent THA w/ fluid collection noted). Pt has hx of DVT following hip surgery, on Coumadin 3mg  PO daily PTA. Pt was admitted with therapeutic INR (of note, pt had elevated troponin and apparent new-onset Afib). S/p 10mg  IV vit K pre-op on 7/22. Warfarin resumed post I&D/poly exchange of THA.  INR trend 1.53 > 1.63 > 1.89 > 1.67 (remains sub-therapeutic), has been receiving 6mg  the last couple nights. Plan SNF at discharge, + dementia. Lovenox for VTE ppx while INR subtherapeutic restarted 7/27.  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   Goal of Therapy:  INR 2-3 Monitor platelets by anticoagulation protocol: Yes   Plan:  1. Increase Coumadin to 7.5mg  PO x1 tonight 2. Daily INR 3. CBC with AM labs 7/30 if still inpt 4. 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   Thank you for the consult.  Rolland Porter, Pharm.D., BCPS Clinical Pharmacist Pager: [phone] 09/25/2011 8:51 AM

## 2011-09-25 NOTE — Progress Notes (Signed)
Patient's wife requested placement at [facility]- a facility in [place] where patient has been a resident in the past.  CSW spoke with Admissions Director Reuel Boom and referral information faxed to facility (this facility does not participate in the use of Care Finder  Pro).  Reuel Boom stated that he would begin [facility] authorization.  Discussed above with patient and his wife.  Lorri Frederick. [place]  [phone]

## 2011-09-25 NOTE — Progress Notes (Signed)
Occupational Therapy Treatment Patient Details Name: Ricky Rodriguez MRN: [id number] DOB: [birth year] Today's Date: 09/25/2011 Time: 7829-5621 OT Time Calculation (min): 14 min  OT Assessment / Plan / Recommendation Comments on Treatment Session Grooming at sink focus of treatment; pt easily fatigued    Follow Up Recommendations  Skilled nursing facility    Barriers to Discharge       Equipment Recommendations  Defer to next venue    Recommendations for Other Services    Frequency     Plan Discharge plan remains appropriate    Precautions / Restrictions Precautions Precautions: Fall;Posterior Hip Precaution Comments: Pt unable to recall 3/3 posterior hip precautions. Restrictions LLE Weight Bearing: Weight bearing as tolerated   Pertinent Vitals/Pain Pt denies pain    ADL  Grooming: Performed;Moderate assistance;Wash/dry hands Where Assessed - Grooming: Supported standing Lower Body Dressing: Performed;+1 Total assistance (don shoes) Where Assessed - Lower Body Dressing: Supported sitting Toilet Transfer: Simulated;Moderate assistance Toilet Transfer Method: Sit to Barista:  (from chair with armrests) Transfers/Ambulation Related to ADLs: Unable to perform ambulation this session secondary to need for second person and pt's fatigue ADL Comments: Pt easily fatigued. Unable to attend to therapist after standing task at sink.     OT Diagnosis:    OT Problem List:   OT Treatment Interventions:     OT Goals ADL Goals Pt Will Perform Grooming: with supervision;Standing at sink;Supported ADL Goal: Grooming - Progress: Progressing toward goals Pt Will Perform Lower Body Bathing: with min assist;Sit to stand from bed;Sit to stand from chair;with adaptive equipment ADL Goal: Lower Body Bathing - Progress: Progressing toward goals Pt Will Perform Lower Body Dressing: with supervision;Sit to stand from chair;Sit to stand from bed;with adaptive  equipment ADL Goal: Lower Body Dressing - Progress: Progressing toward goals Pt Will Transfer to Toilet: with supervision;with DME;3-in-1 ADL Goal: Toilet Transfer - Progress: Progressing toward goals Pt Will Perform Toileting - Clothing Manipulation: with supervision;Sitting on 3-in-1 or toilet;Standing ADL Goal: Toileting - Clothing Manipulation - Progress: Progressing toward goals Pt Will Perform Toileting - Hygiene: with supervision;Sit to stand from 3-in-1/toilet ADL Goal: Toileting - Hygiene - Progress: Progressing toward goals  Visit Information  Last OT Received On: 09/25/11 Assistance Needed: +2    Subjective Data      Prior Functioning       Cognition  Overall Cognitive Status: Impaired Area of Impairment: Following commands;Problem solving;Safety/judgement Arousal/Alertness: Awake/alert Orientation Level: Disoriented to;Time;Situation Behavior During Session: Flat affect Current Attention Level: Sustained Attention - Other Comments: for up to 30 seconds Following Commands: Follows one step commands with increased time Safety/Judgement: Decreased awareness of safety precautions;Decreased safety judgement for tasks assessed    Mobility Transfers Sit to Stand: 3: Mod assist;With armrests;From chair/3-in-1 Stand to Sit: 3: Mod assist;To chair/3-in-1;With armrests Details for Transfer Assistance: Step by step instructions required   Exercises    Balance Dynamic Standing Balance Dynamic Standing - Balance Support: Left upper extremity supported;Right upper extremity supported (alternately) Dynamic Standing - Level of Assistance: 2: Max assist (pt with strong lean to right despite verbal and tactile cues)  End of Session OT - End of Session Equipment Utilized During Treatment: Gait belt Activity Tolerance: Patient limited by fatigue Patient left: in chair;with call bell/phone within reach  GO     Cady Hafen 09/25/2011, 2:41 PM

## 2011-09-25 NOTE — Progress Notes (Signed)
Ricky Rodriguez remains alert and afebrile on antibiotic therapy. PT-INR remains subtherapeutic on coumadin. Patient awaits transfer to a SNF for continued rehab.

## 2011-09-26 ENCOUNTER — Ambulatory Visit: Payer: BC Managed Care – PPO | Admitting: Physical Therapy

## 2011-09-26 LAB — CBC
HCT: 34.2 % — ABNORMAL LOW (ref 39.0–52.0)
MCH: 29.9 pg (ref 26.0–34.0)
MCHC: 33.9 g/dL (ref 30.0–36.0)
MCV: 88.1 fL (ref 78.0–100.0)
RDW: 14.9 % (ref 11.5–15.5)
WBC: 8.3 10*3/uL (ref 4.0–10.5)

## 2011-09-26 MED ORDER — WARFARIN SODIUM 3 MG PO TABS: 3.0000 mg | ORAL_TABLET | Freq: Every day | ORAL | Status: DC

## 2011-09-26 MED ORDER — WARFARIN SODIUM 5 MG PO TABS: 10.0000 mg | ORAL_TABLET | Freq: Every day | ORAL | Status: DC

## 2011-09-26 MED ORDER — WARFARIN SODIUM 10 MG PO TABS
10.0000 mg | ORAL_TABLET | Freq: Once | ORAL | Status: DC
  Filled 2011-09-26: qty 1

## 2011-09-26 MED ORDER — HEPARIN SOD (PORK) LOCK FLUSH 100 UNIT/ML IV SOLN
250.0000 [IU] | INTRAVENOUS | Status: AC | PRN
  Administered 2011-09-26: 250 [IU]

## 2011-09-26 MED ORDER — WARFARIN SODIUM 10 MG PO TABS: 10.0000 mg | ORAL_TABLET | Freq: Once | ORAL | Status: DC

## 2011-09-26 NOTE — Progress Notes (Signed)
Paged Dr. Laural Benes about needing PICC line order for patient to D/C  Mauro Kaufmann, RN

## 2011-09-26 NOTE — Progress Notes (Signed)
ANTICOAGULATION CONSULT NOTE - Follow Up Consult  Pharmacy Consult for Coumadin Indication: Hx DVT  No Known Allergies  Patient Measurements: Height: 6\' 1"  (185.4 cm) Weight: 240 lb 4.8 oz (109 kg) IBW/kg (Calculated) : 79.9   Vital Signs: Temp: 97.6 F (36.4 C) (07/30 0532) BP: 149/74 mmHg (07/30 0532) Pulse Rate: 90  (07/30 0532)  Labs:  Basename 09/26/11 0440 09/25/11 0528 09/24/11 0550  HGB 11.6* -- --  HCT 34.2* -- --  PLT 438* -- --  APTT -- -- --  LABPROT 19.0* 20.0* 22.0*  INR 1.56* 1.67* 1.89*  HEPARINUNFRC -- -- --  CREATININE -- -- --  CKTOTAL -- -- --  CKMB -- -- --  TROPONINI -- -- --    Estimated Creatinine Clearance: 106.9 ml/min (by C-G formula based on Cr of 0.82).   Assessment: 71yo male admitted 7/21 with WBC elevated and temps to 103 d/t L THA infection (recent THA w/ fluid collection noted). Pt has hx of DVT following hip surgery, on Coumadin 3mg  PO daily PTA. Pt was admitted with therapeutic INR (of note, pt had elevated troponin and apparent new-onset Afib). S/p 10mg  IV vit K pre-op on 7/22. Warfarin resumed post I&D/poly exchange of THA.  INR trend 1.53 > 1.63 > 1.89 > 1.67 > 1.56 (remains sub-therapeutic), has been receiving 6mg  and 7.5 mg the last few nights. Lovenox for VTE ppx while INR subtherapeutic, which was restarted 7/27.  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   Goal of Therapy:  INR 2-3 Monitor platelets by anticoagulation protocol: Yes   Plan:  1. Increase Coumadin to 10mg  PO x1 tonight 2. Daily INR   Thank you for the consult.  Rolland Porter, Pharm.D., BCPS Clinical Pharmacist Pager: [phone] 09/26/2011 8:37 AM

## 2011-09-26 NOTE — Progress Notes (Signed)
Physical Therapy Treatment Patient Details Name: Ricky Rodriguez MRN: [id number] DOB: October 27, 1940 Today's Date: 09/26/2011 Time: 0930-1000 PT Time Calculation (min): 30 min  PT Assessment / Plan / Recommendation Comments on Treatment Session  Pt continues with flat affect and slight confusion.  However pt able to increase ambulation distance but continues to ambulate with flexed posture.    Follow Up Recommendations  Skilled nursing facility;Supervision/Assistance - 24 hour    Barriers to Discharge        Equipment Recommendations  Defer to next venue    Recommendations for Other Services    Frequency Min 5X/week   Plan Discharge plan remains appropriate;Frequency remains appropriate    Precautions / Restrictions Precautions Precautions: Fall;Posterior Hip Precaution Comments: Pt unable to recall 3/3 posterior hip precautions. Restrictions Weight Bearing Restrictions: Yes LLE Weight Bearing: Weight bearing as tolerated   Pertinent Vitals/Pain C/o pain but does not rate     Mobility  Bed Mobility Bed Mobility: Supine to Sit Sit to Supine: 1: +2 Total assist Sit to Supine: Patient Percentage: 50% Details for Bed Mobility Assistance: +2 (A) to elevate trunk and LE OOB with max cues for technique.  Pt needs one step instructions to complete task. Transfers Transfers: Sit to Stand;Stand to Sit Sit to Stand: 1: +2 Total assist;From bed Sit to Stand: Patient Percentage: 50% Stand to Sit: 3: Mod assist;To chair/3-in-1;With armrests Details for Transfer Assistance: +2 (A) to initiate transfer and slowly descend to recliner with max cues for proper technique. Ambulation/Gait Ambulation/Gait Assistance: 1: +2 Total assist Ambulation/Gait: Patient Percentage: 70% Ambulation Distance (Feet): 30 Feet Assistive device: Rolling walker Ambulation/Gait Assistance Details: Pt easily fatigue and continues to ambulate on toes.  Pt unable to accept all weight through left LE and continues  to lean to left side.  Pt in complete flexed position during ambulation. Gait Pattern: Step-to pattern;Decreased stride length;Shuffle;Trunk flexed;Lateral trunk lean to right Gait velocity: Decreased General Gait Details: Continues to not stand upright.  Needs constant cues for safety.Somewhat impulsive at times.   Stairs: No    Exercises Total Joint Exercises Ankle Circles/Pumps: AAROM;Both;10 reps Quad Sets: Strengthening;Both;10 reps Short Arc Quad: Strengthening;Left;10 reps Heel Slides: AAROM;Left;10 reps Hip ABduction/ADduction: Strengthening;Left;5 reps   PT Diagnosis:    PT Problem List:   PT Treatment Interventions:     PT Goals Acute Rehab PT Goals PT Goal Formulation: With patient Time For Goal Achievement: 10/05/11 Potential to Achieve Goals: Good Pt will go Supine/Side to Sit: with supervision PT Goal: Supine/Side to Sit - Progress: Progressing toward goal Pt will go Sit to Stand: with upper extremity assist;with min assist PT Goal: Sit to Stand - Progress: Progressing toward goal Pt will Ambulate: with supervision;with least restrictive assistive device;16 - 50 feet PT Goal: Ambulate - Progress: Progressing toward goal  Visit Information  Last PT Received On: 09/26/11 Assistance Needed: +2    Subjective Data      Cognition  Overall Cognitive Status: Impaired Area of Impairment: Following commands;Problem solving;Safety/judgement Arousal/Alertness: Awake/alert Orientation Level: Disoriented to;Time;Situation Behavior During Session: Flat affect Current Attention Level: Sustained Attention - Other Comments: for up to 30 seconds Following Commands: Follows one step commands with increased time Safety/Judgement: Decreased awareness of safety precautions;Decreased safety judgement for tasks assessed Safety/Judgement - Other Comments: Poor safety with use of RW Problem Solving: Required max directional cueing for mobilty.     Balance     End of Session PT -  End of Session Equipment Utilized During Treatment: Gait belt Activity  Tolerance: Patient limited by fatigue Patient left: in chair;with call bell/phone within reach;with family/visitor present Nurse Communication: Mobility status   GP     Essa Wenk 09/26/2011, 10:10 AM Jake Shark, PT DPT [phone]

## 2011-09-29 ENCOUNTER — Ambulatory Visit: Payer: BC Managed Care – PPO | Admitting: Physical Therapy

## 2011-10-02 ENCOUNTER — Ambulatory Visit: Payer: BC Managed Care – PPO | Admitting: Physical Therapy

## 2011-10-05 ENCOUNTER — Ambulatory Visit: Payer: BC Managed Care – PPO | Admitting: Physical Therapy

## 2011-10-09 ENCOUNTER — Ambulatory Visit: Payer: BC Managed Care – PPO | Admitting: Physical Therapy

## 2011-10-12 ENCOUNTER — Ambulatory Visit: Payer: BC Managed Care – PPO | Admitting: Physical Therapy

## 2011-10-16 ENCOUNTER — Ambulatory Visit: Payer: BC Managed Care – PPO | Admitting: Physical Therapy

## 2011-10-19 ENCOUNTER — Ambulatory Visit: Payer: BC Managed Care – PPO | Admitting: Physical Therapy

## 2011-10-23 ENCOUNTER — Ambulatory Visit: Payer: BC Managed Care – PPO | Admitting: Physical Therapy

## 2011-10-24 ENCOUNTER — Encounter: Payer: Self-pay | Admitting: Internal Medicine

## 2011-10-24 ENCOUNTER — Other Ambulatory Visit: Payer: Self-pay | Admitting: Gastroenterology

## 2011-10-24 ENCOUNTER — Ambulatory Visit (INDEPENDENT_AMBULATORY_CARE_PROVIDER_SITE_OTHER): Payer: BC Managed Care – PPO | Admitting: Internal Medicine

## 2011-10-24 VITALS — BP 142/79 | HR 102 | Temp 97.3°F | Ht 72.0 in | Wt 198.0 lb

## 2011-10-24 DIAGNOSIS — I82409 Acute embolism and thrombosis of unspecified deep veins of unspecified lower extremity: Secondary | ICD-10-CM

## 2011-10-24 DIAGNOSIS — T8450XA Infection and inflammatory reaction due to unspecified internal joint prosthesis, initial encounter: Secondary | ICD-10-CM

## 2011-10-24 DIAGNOSIS — T8452XA Infection and inflammatory reaction due to internal left hip prosthesis, initial encounter: Secondary | ICD-10-CM

## 2011-10-24 MED ORDER — CEPHALEXIN 500 MG PO CAPS: 500.0000 mg | ORAL_CAPSULE | Freq: Three times a day (TID) | ORAL | Status: DC

## 2011-10-24 NOTE — Progress Notes (Signed)
Patient ID: Ricky Rodriguez, male   DOB: Apr 08, 1940, 71 y.o.   MRN: [id number]    [facility] for Infectious Disease  Patient Active Problem List  Diagnosis  . Dementia  . Prosthetic joint infection of left hip  . DVT, bilateral lower limbs    Patient's Medications  New Prescriptions   CEPHALEXIN (KEFLEX) 500 MG CAPSULE    Take 1 capsule (500 mg total) by mouth 3 (three) times daily.  Previous Medications   ACETAMINOPHEN (TYLENOL) 325 MG TABLET    Take 2 tablets (650 mg total) by mouth every 6 (six) hours as needed (or Fever >/= 101).   ARIPIPRAZOLE (ABILIFY) 2 MG TABLET    Take 2 mg by mouth at bedtime. Patient uses 3 milligrams of this medication.   LANSOPRAZOLE (PREVACID) 30 MG CAPSULE    Take 30 mg by mouth every morning.    RIFAMPIN (RIFADIN) 300 MG CAPSULE    Take 2 capsules (600 mg total) by mouth daily.   WARFARIN (COUMADIN) 5 MG TABLET    Take 2 tablets (10 mg total) by mouth daily.  Modified Medications   No medications on file  Discontinued Medications   DEXTROSE 5 % SOLN 50 ML WITH CEFTRIAXONE 2 G SOLR 2 G    Inject 2 g into the vein daily.    Subjective: Ricky Rodriguez is in with his wife today for his hospital followup visit. 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 He underwent incision and drainage and was discharged to a skilled nursing facility on IV ceftriaxone and oral rifampin. He returned home about 10 days ago. His wife states that he is doing much better since leaving the skilled nursing facility. He is less sedated. He denies having any pain in his not taking any narcotic pain medication. He is walking more. He uses his walker and she estimates that he walks around his home about 3 times daily. He has not had any problems tolerating his PICC or antibiotics. He is now completed 6 weeks of antibiotic therapy.  Objective: Temp: 97.3 F (36.3 C) (08/27 1035) Temp src: Oral (08/27 1035) BP:  142/79 mmHg (08/27 1035) Pulse Rate: 102  (08/27 1035)  General: He appears comfortable sitting in his wheelchair. He is hard of hearing and has baseline dementia. His wife answers most of the questions. Skin: Left arm PICC site appears normal Lungs: Clear Cor: Regular S1 and S2 with no murmurs Left hip incision is healed   Assessment: He is responding well to incision and drainage and antibiotic therapy. Based on current guidelines for management of prosthetic hip infection I will switch him over to a dual oral regimen now and have his PICC removed. I will plan on 6 more weeks of antibiotic therapy.  Plan: 1. Change ceftriaxone to cephalexin 500 mg orally 3 times daily along with rifampin 2. Discontinue PICC 3. Followup in 6 weeks   Cliffton Asters, MD [facility] for Infectious Disease [facility] [phone] pager   [phone] cell 10/24/2011, 11:07 AM

## 2011-10-24 NOTE — Progress Notes (Addendum)
RN received verbal order to discontinue the patient's PICC line.  Patient identified with name and date of birth. PICC dressing removed, site unremarkable.   PICC line removed using sterile procedure @ 1115. PICC length equal to that noted in patient's hospital chart of 52 cm.  This PICC line was placed at [facility] ED last weekend.  Sterile petroleum gauze + sterile 4X4 applied to PICC site, pressure applied for 10 minutes and covered with Medipore tape as a pressure dressing. Patient tolerated procedure without complaints.  Patient instructed to limit use of arm for 1 hour. Patient instructed that the pressure dressing should remain in place for 24 hours. Patient verbalized understanding of these instructions. Phone call to [facility] to apprise of PICC removal.

## 2011-10-25 ENCOUNTER — Ambulatory Visit
Admission: RE | Admit: 2011-10-25 | Discharge: 2011-10-25 | Disposition: A | Discharge status: Home / Self Care | Payer: BC Managed Care – PPO | Source: Ambulatory Visit | Attending: Gastroenterology | Admitting: Gastroenterology

## 2011-10-25 DIAGNOSIS — I82409 Acute embolism and thrombosis of unspecified deep veins of unspecified lower extremity: Secondary | ICD-10-CM

## 2011-10-26 ENCOUNTER — Ambulatory Visit: Payer: BC Managed Care – PPO | Admitting: Physical Therapy

## 2011-10-27 ENCOUNTER — Ambulatory Visit: Payer: BC Managed Care – PPO | Attending: Internal Medicine | Admitting: Physical Therapy

## 2011-10-27 DIAGNOSIS — IMO0001 Reserved for inherently not codable concepts without codable children: Secondary | ICD-10-CM | POA: Insufficient documentation

## 2011-10-27 DIAGNOSIS — M6281 Muscle weakness (generalized): Secondary | ICD-10-CM | POA: Insufficient documentation

## 2011-10-27 DIAGNOSIS — R262 Difficulty in walking, not elsewhere classified: Secondary | ICD-10-CM | POA: Insufficient documentation

## 2011-10-27 DIAGNOSIS — M25669 Stiffness of unspecified knee, not elsewhere classified: Secondary | ICD-10-CM | POA: Insufficient documentation

## 2011-10-31 ENCOUNTER — Telehealth: Payer: Self-pay | Admitting: *Deleted

## 2011-10-31 ENCOUNTER — Other Ambulatory Visit: Payer: Self-pay | Admitting: Licensed Clinical Social Worker

## 2011-10-31 ENCOUNTER — Ambulatory Visit: Payer: BC Managed Care – PPO | Attending: Internal Medicine | Admitting: Physical Therapy

## 2011-10-31 DIAGNOSIS — R262 Difficulty in walking, not elsewhere classified: Secondary | ICD-10-CM | POA: Insufficient documentation

## 2011-10-31 DIAGNOSIS — IMO0001 Reserved for inherently not codable concepts without codable children: Secondary | ICD-10-CM | POA: Insufficient documentation

## 2011-10-31 DIAGNOSIS — M25669 Stiffness of unspecified knee, not elsewhere classified: Secondary | ICD-10-CM | POA: Insufficient documentation

## 2011-10-31 DIAGNOSIS — M6281 Muscle weakness (generalized): Secondary | ICD-10-CM | POA: Insufficient documentation

## 2011-10-31 DIAGNOSIS — M009 Pyogenic arthritis, unspecified: Secondary | ICD-10-CM

## 2011-10-31 MED ORDER — RIFAMPIN 300 MG PO CAPS: 300.0000 mg | ORAL_CAPSULE | Freq: Every day | ORAL | Status: DC

## 2011-10-31 NOTE — Telephone Encounter (Signed)
Clarification of medication directions.

## 2011-11-02 ENCOUNTER — Ambulatory Visit: Payer: BC Managed Care – PPO | Admitting: Physical Therapy

## 2011-11-07 ENCOUNTER — Ambulatory Visit: Payer: BC Managed Care – PPO | Admitting: Physical Therapy

## 2011-11-09 ENCOUNTER — Ambulatory Visit: Payer: BC Managed Care – PPO | Admitting: Physical Therapy

## 2011-11-14 ENCOUNTER — Ambulatory Visit: Payer: BC Managed Care – PPO | Admitting: Physical Therapy

## 2011-11-16 ENCOUNTER — Ambulatory Visit: Payer: BC Managed Care – PPO | Admitting: Physical Therapy

## 2011-11-21 ENCOUNTER — Ambulatory Visit: Payer: BC Managed Care – PPO | Admitting: Physical Therapy

## 2011-11-23 ENCOUNTER — Ambulatory Visit: Payer: BC Managed Care – PPO | Admitting: Physical Therapy

## 2011-11-28 ENCOUNTER — Ambulatory Visit: Payer: BC Managed Care – PPO | Attending: Internal Medicine | Admitting: Physical Therapy

## 2011-11-28 DIAGNOSIS — R262 Difficulty in walking, not elsewhere classified: Secondary | ICD-10-CM | POA: Insufficient documentation

## 2011-11-28 DIAGNOSIS — IMO0001 Reserved for inherently not codable concepts without codable children: Secondary | ICD-10-CM | POA: Insufficient documentation

## 2011-11-28 DIAGNOSIS — M25669 Stiffness of unspecified knee, not elsewhere classified: Secondary | ICD-10-CM | POA: Insufficient documentation

## 2011-11-28 DIAGNOSIS — M6281 Muscle weakness (generalized): Secondary | ICD-10-CM | POA: Insufficient documentation

## 2011-12-01 ENCOUNTER — Ambulatory Visit: Payer: BC Managed Care – PPO | Admitting: Physical Therapy

## 2011-12-05 ENCOUNTER — Ambulatory Visit: Payer: BC Managed Care – PPO | Admitting: Physical Therapy

## 2011-12-06 ENCOUNTER — Ambulatory Visit (INDEPENDENT_AMBULATORY_CARE_PROVIDER_SITE_OTHER): Payer: BC Managed Care – PPO | Admitting: Internal Medicine

## 2011-12-06 ENCOUNTER — Encounter: Payer: Self-pay | Admitting: Internal Medicine

## 2011-12-06 VITALS — BP 158/75 | HR 99 | Temp 97.5°F | Wt 220.0 lb

## 2011-12-06 DIAGNOSIS — T8450XA Infection and inflammatory reaction due to unspecified internal joint prosthesis, initial encounter: Secondary | ICD-10-CM

## 2011-12-06 DIAGNOSIS — T8452XA Infection and inflammatory reaction due to internal left hip prosthesis, initial encounter: Secondary | ICD-10-CM

## 2011-12-06 NOTE — Progress Notes (Signed)
Patient ID: Ricky Rodriguez, male   DOB: Dec 08, 1940, 71 y.o.   MRN: [id number]    [facility] for Infectious Disease  Patient Active Problem List  Diagnosis  . Dementia  . Prosthetic joint infection of left hip  . DVT, bilateral lower limbs    Patient's Medications  New Prescriptions   No medications on file  Previous Medications   ARIPIPRAZOLE (ABILIFY) 2 MG TABLET    Take 2 mg by mouth at bedtime. Patient uses 3 milligrams of this medication.   CEPHALEXIN (KEFLEX) 500 MG CAPSULE    Take 1 tablet by mouth Three times a day.   LANSOPRAZOLE (PREVACID) 30 MG CAPSULE    Take 30 mg by mouth every morning.    RIFAMPIN (RIFADIN) 300 MG CAPSULE    Take 600 mg by mouth daily. Take 2 capsules daily   WARFARIN (COUMADIN) 5 MG TABLET    Take 2 tablets (10 mg total) by mouth daily.  Modified Medications   No medications on file  Discontinued Medications   No medications on file    Subjective: Ricky Rodriguez is in for his routine followup visit. He completed 6 weeks of IV cefazolin and oral rifampin after open debridement of his left prosthetic hip infection. Operative cultures grew methicillin sensitive coagulase-negative staph. After completion of IV cefazolin he converted over to oral cephalexin with rifampin at his last visit. He has had no problems tolerating his antibiotics. His wife notes that he has been complaining of a little more left hip discomfort but she attributes that to the fact that he is working much harder on physical therapy. He has been much more active recently. He is only needing occasional acetaminophen for pain.  Objective: Temp: 97.5 F (36.4 C) (10/09 1449) Temp src: Oral (10/09 1449) BP: 158/75 mmHg (10/09 1449) Pulse Rate: 99  (10/09 1449)  General: He is alert and in no distress. He is hard of hearing. Skin: Former PICC site healed His left hip incision is fully healed without any evidence of infection. He can stand slowly without assistance. He ambulates slowly  with the aid of a walker.   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 Given different organisms I think they are very distinct infections rather than a late relapse of a chronic smoldering infection. He is now completed 2 and half months of antibiotic therapy. Current guidelines call for 3 months of total antibiotic therapy but I will discuss the situation with Dr. Kathy Rodriguez, his orthopedic surgeon at [facility] and Dr. Annell Rodriguez, his orthopedic surgeon hearing Ricky Rodriguez.  Plan: 1. Continue cephalexin and rifampin 2. Check sedimentation rate and C-reactive protein   Ricky Asters, MD [facility] for Infectious Disease [facility] [phone] pager   [phone] cell 12/06/2011, 3:09 PM

## 2011-12-07 LAB — C-REACTIVE PROTEIN: CRP: 1.8 mg/dL — ABNORMAL HIGH (ref <0.60)

## 2011-12-08 ENCOUNTER — Ambulatory Visit: Payer: BC Managed Care – PPO | Admitting: Physical Therapy

## 2011-12-12 ENCOUNTER — Ambulatory Visit: Payer: BC Managed Care – PPO | Admitting: Physical Therapy

## 2011-12-12 IMAGING — CT CT PELVIS W/O CM
2 of 3 series · 14 of 32 positions shown, 19 images · non-contrast
Comparison: CT scan 06/25/2009

CLINICAL DATA: Pain and swelling.  Follow-up acetabular fracture.

CT PELVIS WITHOUT CONTRAST
TECHNIQUE: Multidetector CT imaging of the pelvis was performed
following the standard protocol without intravenous contrast.
TECHNIQUE: 3-dimensional CT images were rendered by post-
processing of the original CT data at independent workstation.  The
3-dimensional CT images were interpreted, and findings were
reported in the accompanying complete CT report for this study.

[Series 3: pelvis standard · axial · 0.78mm/px · z∈[-212,-4]mm · 7 of 107 slices shown, 12 images]
[im 14/107  soft-tissue]
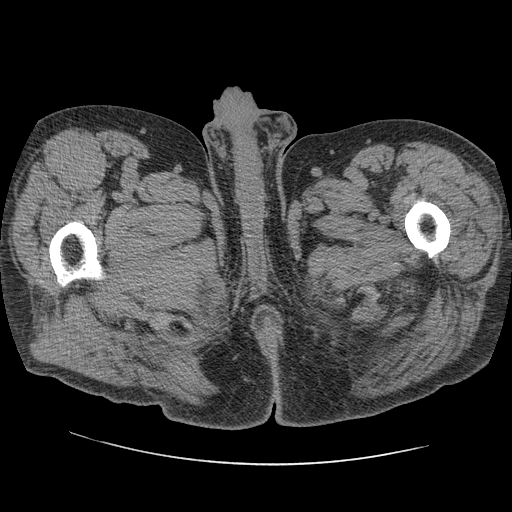
[im 14/107  bone]
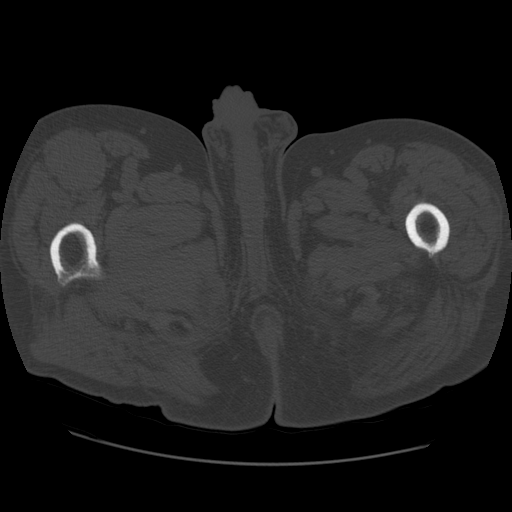
[im 27/107  soft-tissue]
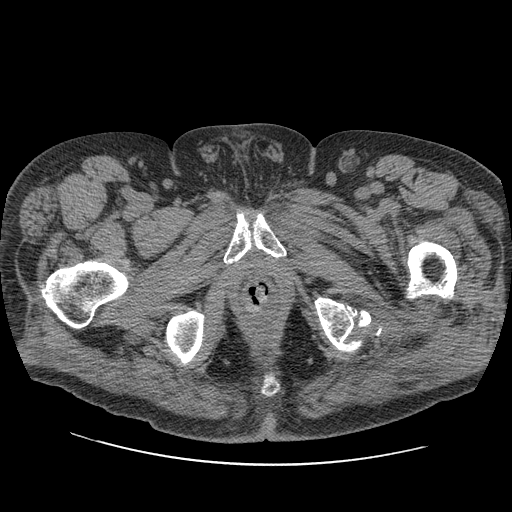
[im 40/107  soft-tissue]
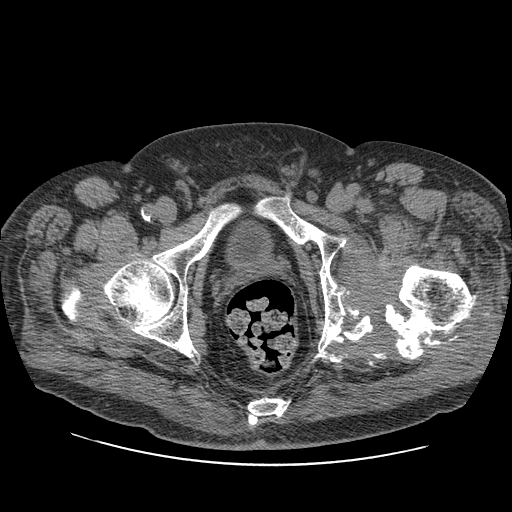
[im 54/107  soft-tissue]
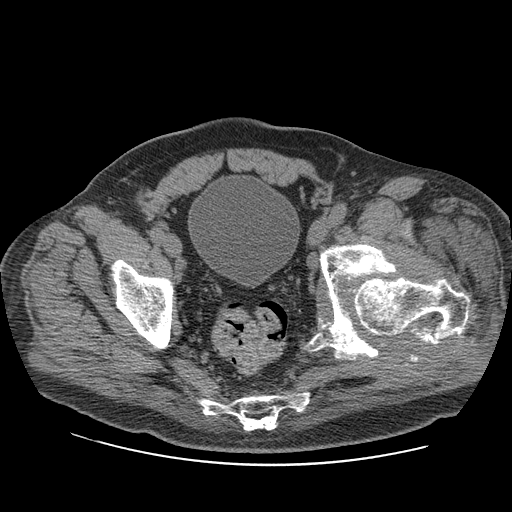
[im 54/107  lung]
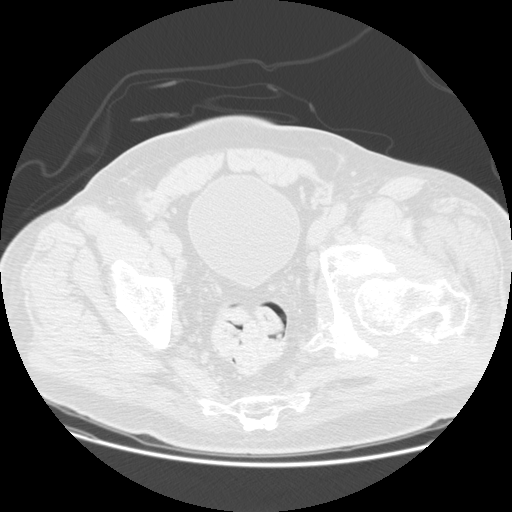
[im 67/107  soft-tissue]
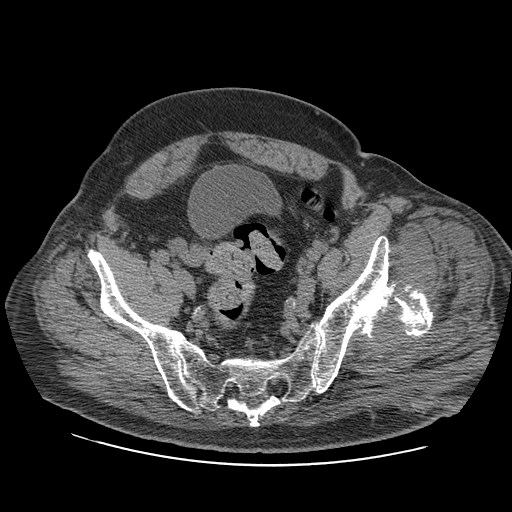
[im 67/107  lung]
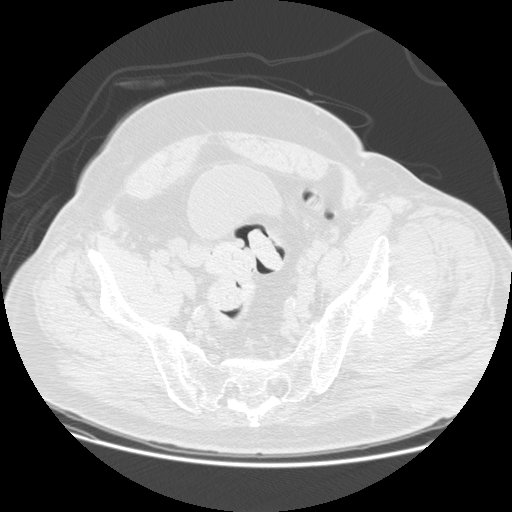
[im 80/107  soft-tissue]
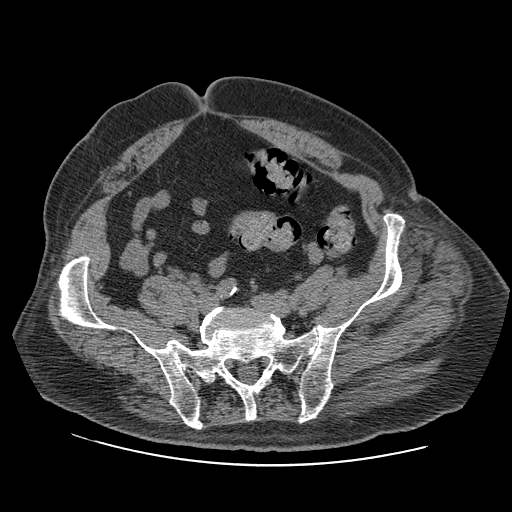
[im 80/107  lung]
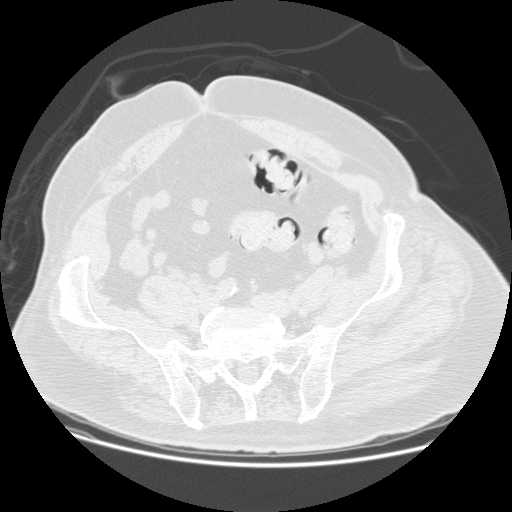
[im 93/107  soft-tissue]
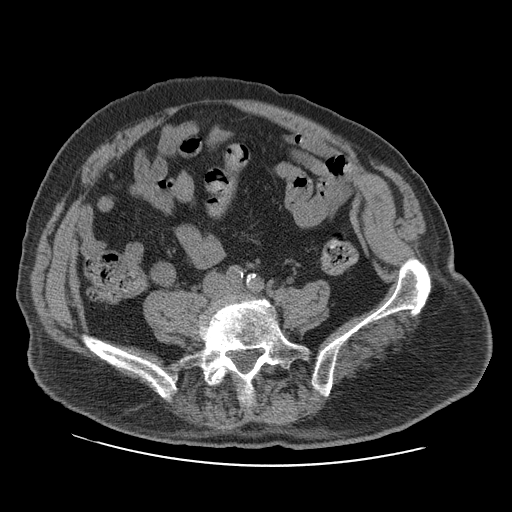
[im 93/107  lung]
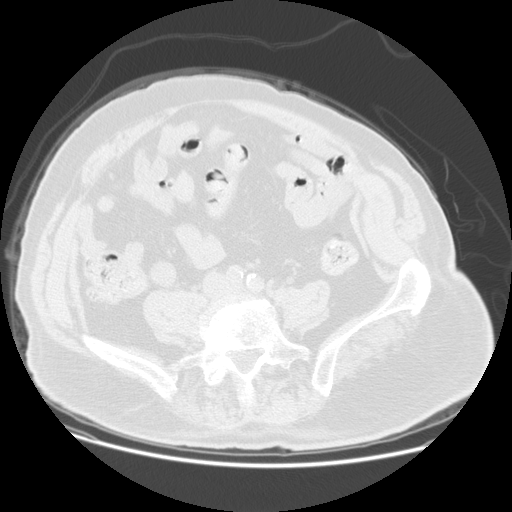

[Series 401: sagittal · sagittal · 0.78mm/px · 7 of 190 slices shown]
[im 14/190  soft-tissue]
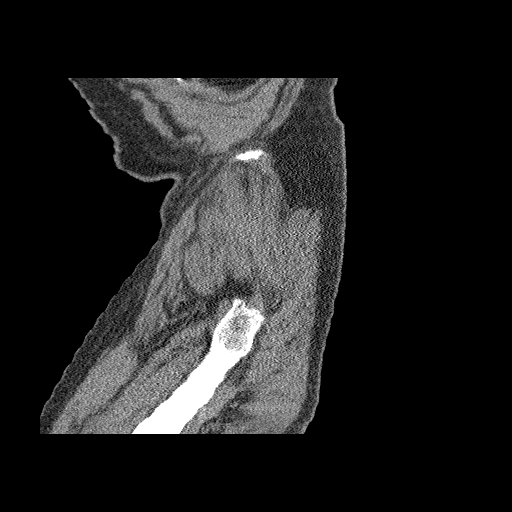
[im 41/190  soft-tissue]
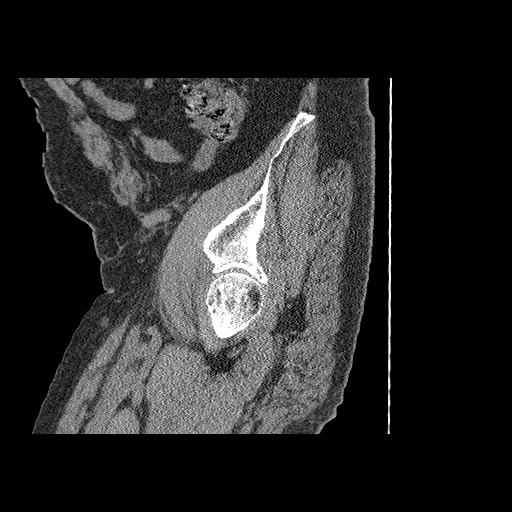
[im 68/190  soft-tissue]
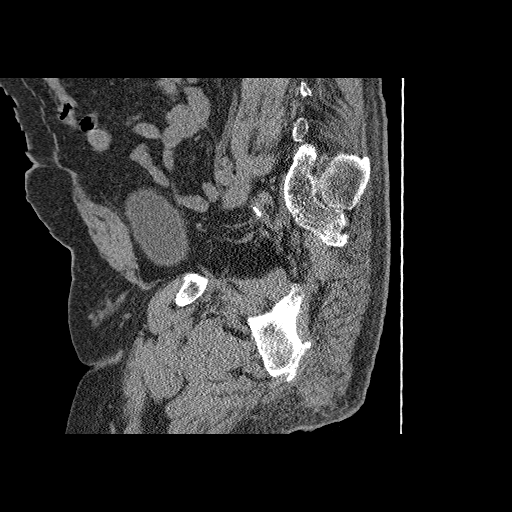
[im 82/190  soft-tissue]
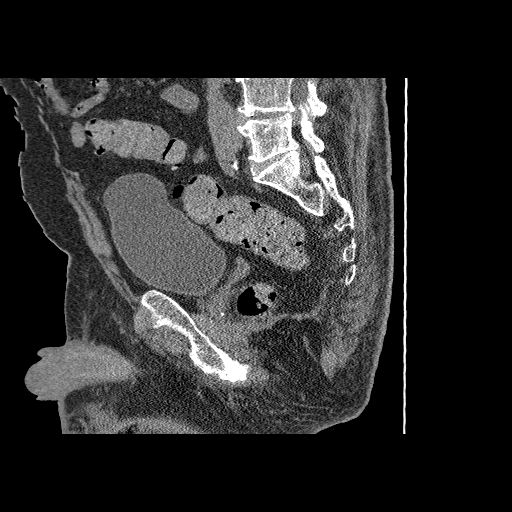
[im 109/190  soft-tissue]
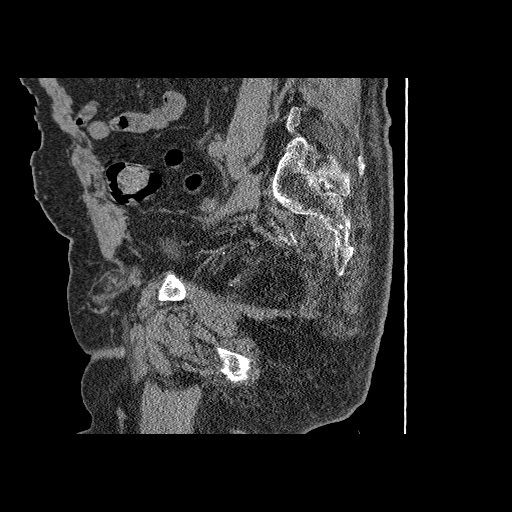
[im 122/190  soft-tissue]
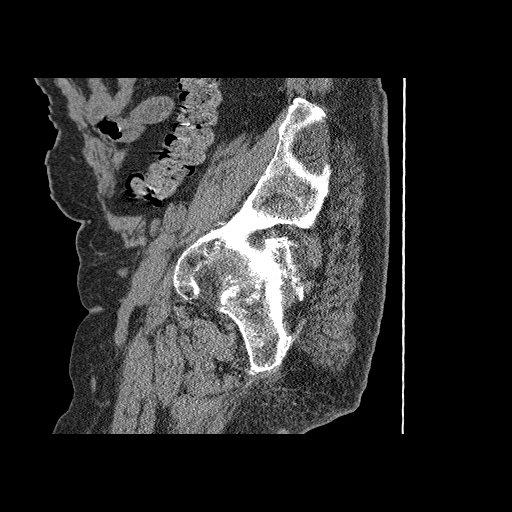
[im 149/190  soft-tissue]
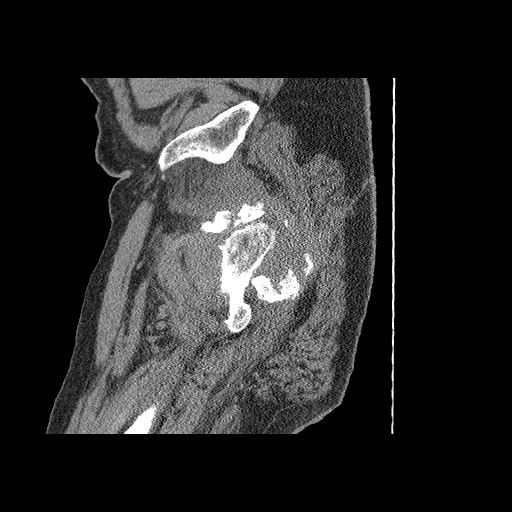

[14 of 32 positions shown; findings below may reference images not displayed]

FINDINGS: Stable chronic posterior superior dislocation of the
left femoral head with remote fractures of the femoral head and
acetabulum.  Persistent large joint effusion and marked synovial
thickening around the markedly widened and expanded acetabulum
along with persistent areas of heterotopic ossification.

The right hip remains located.  No acute bony findings.
IMPRESSION: Stable CT appearance of the left hip when compared to prior
examination from 06/25/2009.  No new/acute findings.

3-DIMENSIONAL CT IMAGE RENDERING AT INDEPENDENT WORKSTATION:

## 2011-12-14 ENCOUNTER — Ambulatory Visit: Payer: BC Managed Care – PPO | Admitting: Physical Therapy

## 2011-12-18 ENCOUNTER — Ambulatory Visit: Payer: BC Managed Care – PPO | Admitting: Physical Therapy

## 2011-12-21 ENCOUNTER — Telehealth: Payer: Self-pay | Admitting: Licensed Clinical Social Worker

## 2011-12-21 ENCOUNTER — Ambulatory Visit: Payer: BC Managed Care – PPO | Admitting: Physical Therapy

## 2011-12-21 NOTE — Telephone Encounter (Signed)
Patient's wife called asking if we could fax labs and office notes to Dr. Simeon Craft at [facility], I faxed those for her. She also wanted Dr. Orvan Falconer to know that Dr. Annell Greening suggested keeping the patient  on 1 year of oral antibiotic therapy and reevaluate in 1 year, she also states that Dr. Simeon Craft suggests long term therapy as well. She wanted Dr.Campbell to know the opinions of the patient's surgeons.

## 2011-12-24 ENCOUNTER — Other Ambulatory Visit: Payer: Self-pay | Admitting: Internal Medicine

## 2011-12-25 ENCOUNTER — Telehealth: Payer: Self-pay | Admitting: *Deleted

## 2011-12-25 NOTE — Telephone Encounter (Signed)
Refills sent

## 2011-12-25 NOTE — Telephone Encounter (Signed)
Please send in an order for 6 refills on his cephalexin and rifampin.

## 2011-12-25 NOTE — Telephone Encounter (Signed)
Dr. Orvan Falconer our office has received request from patient's pharmacy for refills on his oral antibiotics, is this OK to fill, per office note you were going to consult with patient's surgeon. Ricky Rodriguez

## 2011-12-26 ENCOUNTER — Ambulatory Visit: Payer: BC Managed Care – PPO | Admitting: Physical Therapy

## 2011-12-28 ENCOUNTER — Ambulatory Visit: Payer: BC Managed Care – PPO | Admitting: Physical Therapy

## 2012-01-05 ENCOUNTER — Ambulatory Visit: Payer: BC Managed Care – PPO | Attending: Internal Medicine | Admitting: Physical Therapy

## 2012-01-05 DIAGNOSIS — IMO0001 Reserved for inherently not codable concepts without codable children: Secondary | ICD-10-CM | POA: Insufficient documentation

## 2012-01-05 DIAGNOSIS — M6281 Muscle weakness (generalized): Secondary | ICD-10-CM | POA: Insufficient documentation

## 2012-01-05 DIAGNOSIS — M25669 Stiffness of unspecified knee, not elsewhere classified: Secondary | ICD-10-CM | POA: Insufficient documentation

## 2012-01-05 DIAGNOSIS — R262 Difficulty in walking, not elsewhere classified: Secondary | ICD-10-CM | POA: Insufficient documentation

## 2012-01-10 ENCOUNTER — Ambulatory Visit: Payer: BC Managed Care – PPO | Admitting: Physical Therapy

## 2012-01-16 ENCOUNTER — Ambulatory Visit: Payer: BC Managed Care – PPO | Admitting: Physical Therapy

## 2012-01-23 ENCOUNTER — Ambulatory Visit: Payer: BC Managed Care – PPO | Admitting: Physical Therapy

## 2012-01-30 ENCOUNTER — Ambulatory Visit: Payer: BC Managed Care – PPO | Attending: Internal Medicine | Admitting: Physical Therapy

## 2012-01-30 DIAGNOSIS — IMO0001 Reserved for inherently not codable concepts without codable children: Secondary | ICD-10-CM | POA: Insufficient documentation

## 2012-01-30 DIAGNOSIS — M25669 Stiffness of unspecified knee, not elsewhere classified: Secondary | ICD-10-CM | POA: Insufficient documentation

## 2012-01-30 DIAGNOSIS — M6281 Muscle weakness (generalized): Secondary | ICD-10-CM | POA: Insufficient documentation

## 2012-01-30 DIAGNOSIS — R262 Difficulty in walking, not elsewhere classified: Secondary | ICD-10-CM | POA: Insufficient documentation

## 2012-02-06 ENCOUNTER — Ambulatory Visit: Payer: BC Managed Care – PPO | Admitting: Physical Therapy

## 2012-02-08 ENCOUNTER — Encounter: Payer: Self-pay | Admitting: Internal Medicine

## 2012-02-08 ENCOUNTER — Ambulatory Visit (INDEPENDENT_AMBULATORY_CARE_PROVIDER_SITE_OTHER): Payer: BC Managed Care – PPO | Admitting: Internal Medicine

## 2012-02-08 VITALS — BP 169/75 | HR 97 | Temp 97.5°F | Ht 72.0 in | Wt 225.0 lb

## 2012-02-08 DIAGNOSIS — T8452XA Infection and inflammatory reaction due to internal left hip prosthesis, initial encounter: Secondary | ICD-10-CM

## 2012-02-08 DIAGNOSIS — T8450XA Infection and inflammatory reaction due to unspecified internal joint prosthesis, initial encounter: Secondary | ICD-10-CM

## 2012-02-08 LAB — SEDIMENTATION RATE: Sed Rate: 1 mm/hr (ref 0–16)

## 2012-02-08 NOTE — Progress Notes (Signed)
Patient ID: Ricky Rodriguez, male   DOB: 1940-10-23, 71 y.o.   MRN: [id number]    [facility] for Infectious Disease  Patient Active Problem List  Diagnosis  . Dementia  . Prosthetic joint infection of left hip  . DVT, bilateral lower limbs    Patient's Medications  New Prescriptions   No medications on file  Previous Medications   ARIPIPRAZOLE (ABILIFY) 2 MG TABLET    Take 2 mg by mouth at bedtime. Patient uses 3 milligrams of this medication.   CEPHALEXIN (KEFLEX) 500 MG CAPSULE    take 1 capsule by mouth three times a day   LANSOPRAZOLE (PREVACID) 30 MG CAPSULE    Take 30 mg by mouth every morning.    RIFAMPIN (RIFADIN) 300 MG CAPSULE    take 2 capsules by mouth once daily  Modified Medications   No medications on file  Discontinued Medications   CEPHALEXIN (KEFLEX) 500 MG CAPSULE    Take 1 tablet by mouth Three times a day.   RIFAMPIN (RIFADIN) 300 MG CAPSULE    Take 600 mg by mouth daily. Take 2 capsules daily   WARFARIN (COUMADIN) 5 MG TABLET    Take 2 tablets (10 mg total) by mouth daily.    Subjective: Ricky Rodriguez is in for his routine visit. 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 He is feeling better and his wife states that he has been able to be more active. He still uses his walker. He denies having any recent left hip pain and is not taking any pain medications. He has had no problems tolerating his oral cephalexin or rifampin.  Objective: Temp: 97.5 F (36.4 C) (12/12 1116) Temp src: Oral (12/12 1116) BP: 169/75 mmHg (12/12 1116) Pulse Rate: 97  (12/12 1116)  General: He is more alert, talkative and in good spirits Gait: He can walk are normal pace with his walker but is on his tiptoes. 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 I have reviewed notes from my partner, Dr. Maurice March, which indicate that in 2012 he had left hip infection with staph aureus (notes mention both the MRSA and MSSA). That suggests that his current infection is not a relapse of chronic infection but a second, distinct episode. That means that there is a possibility it could be cured following surgery in July and with a several month period of postoperative antibiotics. Ricky Rodriguez's wife, Ricky Rodriguez, has told me that his orthopedic surgeon at [facility], Dr. Kathy Breach, has recommended prolonged antibiotics. That is also favored by his local orthopedic surgeon, Dr. Annell Greening. I am comfortable continuing his current antibiotics for now but have had a discussion with them about the potential side effects of prolonged antibiotic therapy including the development of antibiotic resistant bacteria and C. difficile colitis. It will be important to continue discussion of potential benefits and risks of ongoing antibiotic therapy.  Plan: 1. Continue current antibiotics 2. Repeat sed rate and C-reactive protein 3. Followup in 6-8 weeks   Ricky Asters, MD [facility] for Infectious Disease [facility] [phone] pager   [phone] cell 02/08/2012, 11:41 AM

## 2012-02-09 LAB — C-REACTIVE PROTEIN: CRP: 1.2 mg/dL — ABNORMAL HIGH (ref <0.60)

## 2012-02-13 ENCOUNTER — Ambulatory Visit: Payer: BC Managed Care – PPO | Admitting: Physical Therapy

## 2012-02-19 ENCOUNTER — Ambulatory Visit: Payer: BC Managed Care – PPO | Admitting: Physical Therapy

## 2012-03-01 ENCOUNTER — Telehealth: Payer: Self-pay | Admitting: *Deleted

## 2012-03-01 NOTE — Telephone Encounter (Signed)
Patient wife called and asked if they could get a copy of the patient last labs 02/08/12. Advised they have a visit with the Orthopedist on Monday 03/04/12. Advised they will come by to pick it up this afternoon. Advised her we lock the doors at 1230 today so will need to be picked-up before then, and she advised no problem.

## 2012-04-09 ENCOUNTER — Ambulatory Visit (INDEPENDENT_AMBULATORY_CARE_PROVIDER_SITE_OTHER): Payer: BC Managed Care – PPO | Admitting: Internal Medicine

## 2012-04-09 ENCOUNTER — Encounter: Payer: Self-pay | Admitting: Internal Medicine

## 2012-04-09 VITALS — BP 165/83 | HR 98 | Ht 72.0 in | Wt 228.5 lb

## 2012-04-09 DIAGNOSIS — T8450XA Infection and inflammatory reaction due to unspecified internal joint prosthesis, initial encounter: Secondary | ICD-10-CM

## 2012-04-09 DIAGNOSIS — T8452XA Infection and inflammatory reaction due to internal left hip prosthesis, initial encounter: Secondary | ICD-10-CM

## 2012-04-09 NOTE — Progress Notes (Signed)
Patient ID: Ricky Rodriguez, male   DOB: 12/22/1940, 72 y.o.   MRN: [id number]         [facility] for Infectious Disease  Patient Active Problem List  Diagnosis  . Dementia  . Prosthetic joint infection of left hip  . DVT, bilateral lower limbs    Patient's Medications  New Prescriptions   No medications on file  Previous Medications   ARIPIPRAZOLE (ABILIFY) 2 MG TABLET    Take 2 mg by mouth at bedtime. Patient uses 3 milligrams of this medication.   CEPHALEXIN (KEFLEX) 500 MG CAPSULE    take 1 capsule by mouth three times a day   LANSOPRAZOLE (PREVACID) 30 MG CAPSULE    Take 30 mg by mouth every morning.    RIFAMPIN (RIFADIN) 300 MG CAPSULE    take 2 capsules by mouth once daily  Modified Medications   No medications on file  Discontinued Medications   No medications on file    Subjective: Ricky Rodriguez is in with his wife, Ricky Rodriguez, for his routine visit. 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 He underwent incision and drainage and poly-exchange last July. He has had no problems tolerating his cephalexin oral rifampin. He has not had any diarrhea. His left hip has healed completely. He is not having any pain.  Objective: BP: 165/83 mmHg (02/11 1130) Pulse Rate: 98 (02/11 1130)  General: is in good spirits and in no distress. Do to his dementia Ricky Rodriguez answers all questions Left hip incision is completely healed  Lab Results  Component Value Date   CRP 1.2* 02/08/2012   Lab Results  Component Value Date   ESRSEDRATE 1 02/08/2012    Assessment: There is a good possibility that his infection has been cured with surgery and over 6 months of combination antibiotic therapy. However, Ricky Rodriguez is very concerned about stopping antibiotics and the risk of the infection coming back. She does not believe he would be able to withstand more surgery easily. I suggested that we repeat  inflammatory markers today and have him stay on cephalexin. I suggested that he stop taking the rifampin when his current supply runs out in a few weeks. She is in agreement.  Plan: 1. Repeat sedimentation rate and C-reactive protein 2. Continue cephalexin 3. Discontinue rifampin when he runs out in 2 weeks 4. Followup in 2 months   Cliffton Asters, MD [facility] for Infectious Disease [facility] [phone] pager   [phone] cell 04/09/2012, 11:54 AM

## 2012-06-04 ENCOUNTER — Ambulatory Visit: Payer: BC Managed Care – PPO | Admitting: Internal Medicine

## 2012-06-11 ENCOUNTER — Encounter: Payer: Self-pay | Admitting: Internal Medicine

## 2012-06-11 ENCOUNTER — Ambulatory Visit (INDEPENDENT_AMBULATORY_CARE_PROVIDER_SITE_OTHER): Payer: BC Managed Care – PPO | Admitting: Internal Medicine

## 2012-06-11 VITALS — BP 176/71 | HR 108 | Temp 96.7°F | Ht 72.0 in | Wt 227.0 lb

## 2012-06-11 DIAGNOSIS — Z5189 Encounter for other specified aftercare: Secondary | ICD-10-CM

## 2012-06-11 DIAGNOSIS — T8452XD Infection and inflammatory reaction due to internal left hip prosthesis, subsequent encounter: Secondary | ICD-10-CM

## 2012-06-11 NOTE — Progress Notes (Signed)
Patient ID: Ricky Rodriguez, male   DOB: 1940-03-11, 72 y.o.   MRN: [id number]         [facility] for Infectious Disease  Patient Active Problem List  Diagnosis  . Dementia  . Prosthetic joint infection of left hip  . DVT, bilateral lower limbs    Patient's Medications  New Prescriptions   No medications on file  Previous Medications   ARIPIPRAZOLE (ABILIFY) 2 MG TABLET    Take 2 mg by mouth at bedtime. Patient uses 3 milligrams of this medication.   CEPHALEXIN (KEFLEX) 500 MG CAPSULE    take 1 capsule by mouth three times a day   LANSOPRAZOLE (PREVACID) 30 MG CAPSULE    Take 30 mg by mouth every morning.   Modified Medications   No medications on file  Discontinued Medications   RIFAMPIN (RIFADIN) 300 MG CAPSULE    take 2 capsules by mouth once daily    Subjective: Ember is in with his wife, [name], for his routine visit. 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 He's had no problems taking or tolerating the cephalexin. He still uses a walker to ambulate but is not having any significant hip pain. He has not noticed any redness or swelling of his hip.  Objective: Temp: 96.7 F (35.9 C) (04/15 1119) Temp src: Oral (04/15 1119) BP: 176/71 mmHg (04/15 1119) Pulse Rate: 108 (04/15 1119)  General: He is in good spirits. He is more talkative and interactive than usual I did not examine his left hip today. He walks slowly, tiptoeing on his left foot but is steady with his walker    Lab Results  Component Value Date   CRP 1.1* 04/09/2012   Lab Results  Component Value Date   ESRSEDRATE 1 04/09/2012   Assessment: They are in favor of continuing chronic suppressive cephalexin. We had a discussion about the risk benefit ratio of chronic suppressive antibiotics.  Plan: 1. Continue  cephalexin 2. Followup in 4 months   Cliffton Asters, MD [facility] for Infectious Disease [facility] [phone] pager   [phone] cell 06/11/2012, 11:53 AM

## 2012-08-10 ENCOUNTER — Other Ambulatory Visit: Payer: Self-pay | Admitting: Internal Medicine

## 2012-09-06 ENCOUNTER — Ambulatory Visit: Payer: BC Managed Care – PPO | Attending: Physician Assistant | Admitting: Physical Therapy

## 2012-09-06 DIAGNOSIS — IMO0001 Reserved for inherently not codable concepts without codable children: Secondary | ICD-10-CM | POA: Insufficient documentation

## 2012-09-06 DIAGNOSIS — M25669 Stiffness of unspecified knee, not elsewhere classified: Secondary | ICD-10-CM | POA: Insufficient documentation

## 2012-09-06 DIAGNOSIS — R5381 Other malaise: Secondary | ICD-10-CM | POA: Insufficient documentation

## 2012-09-06 DIAGNOSIS — R269 Unspecified abnormalities of gait and mobility: Secondary | ICD-10-CM | POA: Insufficient documentation

## 2012-09-17 ENCOUNTER — Ambulatory Visit: Payer: BC Managed Care – PPO | Admitting: Physical Therapy

## 2012-09-20 ENCOUNTER — Ambulatory Visit: Payer: BC Managed Care – PPO | Admitting: Physical Therapy

## 2012-09-30 ENCOUNTER — Ambulatory Visit: Payer: BC Managed Care – PPO | Attending: Physician Assistant | Admitting: Physical Therapy

## 2012-09-30 DIAGNOSIS — M25669 Stiffness of unspecified knee, not elsewhere classified: Secondary | ICD-10-CM | POA: Insufficient documentation

## 2012-09-30 DIAGNOSIS — R5381 Other malaise: Secondary | ICD-10-CM | POA: Insufficient documentation

## 2012-09-30 DIAGNOSIS — R269 Unspecified abnormalities of gait and mobility: Secondary | ICD-10-CM | POA: Insufficient documentation

## 2012-09-30 DIAGNOSIS — IMO0001 Reserved for inherently not codable concepts without codable children: Secondary | ICD-10-CM | POA: Insufficient documentation

## 2012-10-02 ENCOUNTER — Ambulatory Visit: Payer: BC Managed Care – PPO | Admitting: Physical Therapy

## 2012-10-07 ENCOUNTER — Ambulatory Visit: Payer: BC Managed Care – PPO | Admitting: Physical Therapy

## 2012-10-09 ENCOUNTER — Ambulatory Visit: Payer: BC Managed Care – PPO | Admitting: Physical Therapy

## 2012-10-14 ENCOUNTER — Encounter: Payer: Self-pay | Admitting: Internal Medicine

## 2012-10-14 ENCOUNTER — Ambulatory Visit (INDEPENDENT_AMBULATORY_CARE_PROVIDER_SITE_OTHER): Payer: BC Managed Care – PPO | Admitting: Internal Medicine

## 2012-10-14 VITALS — BP 178/70 | HR 93 | Ht 72.0 in | Wt 231.2 lb

## 2012-10-14 DIAGNOSIS — Z5189 Encounter for other specified aftercare: Secondary | ICD-10-CM

## 2012-10-14 DIAGNOSIS — T8452XD Infection and inflammatory reaction due to internal left hip prosthesis, subsequent encounter: Secondary | ICD-10-CM

## 2012-10-14 NOTE — Progress Notes (Signed)
Patient ID: Ricky Rodriguez, male   DOB: 07/28/1940, 72 y.o.   MRN: [id number]         [facility] for Infectious Disease  Patient Active Problem List   Diagnosis Date Noted  . DVT, bilateral lower limbs 09/21/2011  . 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 Dementia 09/16/2011    Patient's Medications  New Prescriptions   No medications on file  Previous Medications   ARIPIPRAZOLE (ABILIFY) 2 MG TABLET    Take 2 mg by mouth daily with breakfast. Patient uses 3 milligrams of this medication.   CEPHALEXIN (KEFLEX) 500 MG CAPSULE    take 1 capsule by mouth three times a day   LANSOPRAZOLE (PREVACID) 30 MG CAPSULE    Take 30 mg by mouth every morning.   Modified Medications   No medications on file  Discontinued Medications   No medications on file    Subjective: Ricky Rodriguez is in for his 6 month followup visit with his wife. He continues to take Keflex for chronic suppression of possible persistent left prosthetic hip infection with methicillin sensitive coagulase-negative staph. He underwent incision and drainage of his left hip in July of 2013. His orthopedic surgeons and his wife have been reluctant to try stopping Keflex for fear that his infection could relapse. He has not had any problems tolerating the Keflex. She sometimes has difficulty getting him to take 3 doses daily but he always gets at least 2 in. He has had a couple of falls but does not have any left hip pain. He recently saw his orthopedic surgeon at [facility] took plain x-rays of the left hip and said everything looked good. He has not had any fever, chills or sweats. He has had some problems with constipation. Review of Systems: Pertinent items are noted in HPI.  Past Medical History  Diagnosis Date  . Dementia   . History of blood clots   . Acid reflux   . A-fib   . Shortness of breath     History  Substance Use Topics  . Smoking status: Never Smoker   . Smokeless tobacco:  Never Used  . Alcohol Use: 0.6 oz/week    1 Glasses of wine per week     Comment: occasional    No family history on file.  No Known Allergies  Objective: BP: 178/70 mmHg (08/18 1508) Pulse Rate: 93 (08/18 1508)  General: He is in good spirits and in no distress Left hip incision fully healed without any evidence of active infection   Assessment: He is tolerating chronic suppressive Keflex. His wife, [name], does not want him to stop antibiotic therapy.  Plan: 1. Continue Keflex for now 2. Followup in 6 months   Cliffton Asters, MD [facility] for Infectious Disease [facility] [phone] pager   [phone] cell 10/14/2012, 3:24 PM

## 2012-10-15 ENCOUNTER — Ambulatory Visit: Payer: BC Managed Care – PPO | Admitting: Physical Therapy

## 2012-10-18 ENCOUNTER — Ambulatory Visit: Payer: BC Managed Care – PPO | Admitting: Physical Therapy

## 2012-10-21 ENCOUNTER — Ambulatory Visit: Payer: BC Managed Care – PPO | Admitting: Physical Therapy

## 2012-10-23 ENCOUNTER — Ambulatory Visit: Payer: BC Managed Care – PPO | Admitting: Physical Therapy

## 2012-10-25 ENCOUNTER — Ambulatory Visit: Payer: BC Managed Care – PPO | Admitting: Physical Therapy

## 2012-10-29 ENCOUNTER — Ambulatory Visit: Payer: BC Managed Care – PPO | Attending: Physician Assistant | Admitting: Physical Therapy

## 2012-10-29 DIAGNOSIS — R269 Unspecified abnormalities of gait and mobility: Secondary | ICD-10-CM | POA: Insufficient documentation

## 2012-10-29 DIAGNOSIS — R5381 Other malaise: Secondary | ICD-10-CM | POA: Insufficient documentation

## 2012-10-29 DIAGNOSIS — M25669 Stiffness of unspecified knee, not elsewhere classified: Secondary | ICD-10-CM | POA: Insufficient documentation

## 2012-10-29 DIAGNOSIS — IMO0001 Reserved for inherently not codable concepts without codable children: Secondary | ICD-10-CM | POA: Insufficient documentation

## 2012-10-31 ENCOUNTER — Ambulatory Visit: Payer: BC Managed Care – PPO | Admitting: Physical Therapy

## 2012-11-05 ENCOUNTER — Ambulatory Visit: Payer: BC Managed Care – PPO | Admitting: Physical Therapy

## 2012-11-08 ENCOUNTER — Ambulatory Visit: Payer: BC Managed Care – PPO | Admitting: Physical Therapy

## 2012-11-12 ENCOUNTER — Ambulatory Visit: Payer: BC Managed Care – PPO | Admitting: Physical Therapy

## 2012-11-14 ENCOUNTER — Ambulatory Visit: Payer: BC Managed Care – PPO | Admitting: Physical Therapy

## 2012-11-19 ENCOUNTER — Ambulatory Visit: Payer: BC Managed Care – PPO | Admitting: Physical Therapy

## 2012-11-22 ENCOUNTER — Ambulatory Visit: Payer: BC Managed Care – PPO | Admitting: Physical Therapy

## 2012-11-26 ENCOUNTER — Ambulatory Visit: Payer: BC Managed Care – PPO | Admitting: Physical Therapy

## 2012-12-03 ENCOUNTER — Ambulatory Visit: Payer: BC Managed Care – PPO | Attending: Physician Assistant | Admitting: Physical Therapy

## 2012-12-03 DIAGNOSIS — R5381 Other malaise: Secondary | ICD-10-CM | POA: Insufficient documentation

## 2012-12-03 DIAGNOSIS — R269 Unspecified abnormalities of gait and mobility: Secondary | ICD-10-CM | POA: Insufficient documentation

## 2012-12-03 DIAGNOSIS — IMO0001 Reserved for inherently not codable concepts without codable children: Secondary | ICD-10-CM | POA: Insufficient documentation

## 2012-12-03 DIAGNOSIS — M25669 Stiffness of unspecified knee, not elsewhere classified: Secondary | ICD-10-CM | POA: Insufficient documentation

## 2012-12-10 ENCOUNTER — Ambulatory Visit: Payer: BC Managed Care – PPO | Admitting: Physical Therapy

## 2012-12-17 ENCOUNTER — Ambulatory Visit: Payer: BC Managed Care – PPO | Admitting: Physical Therapy

## 2012-12-26 ENCOUNTER — Ambulatory Visit: Payer: BC Managed Care – PPO | Admitting: Physical Therapy

## 2012-12-31 ENCOUNTER — Ambulatory Visit: Payer: BC Managed Care – PPO | Attending: Physician Assistant | Admitting: Physical Therapy

## 2012-12-31 DIAGNOSIS — IMO0001 Reserved for inherently not codable concepts without codable children: Secondary | ICD-10-CM | POA: Insufficient documentation

## 2012-12-31 DIAGNOSIS — R269 Unspecified abnormalities of gait and mobility: Secondary | ICD-10-CM | POA: Insufficient documentation

## 2012-12-31 DIAGNOSIS — M25669 Stiffness of unspecified knee, not elsewhere classified: Secondary | ICD-10-CM | POA: Insufficient documentation

## 2012-12-31 DIAGNOSIS — R5381 Other malaise: Secondary | ICD-10-CM | POA: Insufficient documentation

## 2013-01-07 ENCOUNTER — Ambulatory Visit: Payer: BC Managed Care – PPO | Admitting: Physical Therapy

## 2013-01-14 ENCOUNTER — Ambulatory Visit: Payer: BC Managed Care – PPO | Admitting: Physical Therapy

## 2013-01-21 ENCOUNTER — Ambulatory Visit: Payer: BC Managed Care – PPO | Admitting: Physical Therapy

## 2013-01-28 ENCOUNTER — Ambulatory Visit: Payer: BC Managed Care – PPO | Attending: Physician Assistant | Admitting: Physical Therapy

## 2013-01-28 DIAGNOSIS — IMO0001 Reserved for inherently not codable concepts without codable children: Secondary | ICD-10-CM | POA: Insufficient documentation

## 2013-01-28 DIAGNOSIS — R269 Unspecified abnormalities of gait and mobility: Secondary | ICD-10-CM | POA: Insufficient documentation

## 2013-01-28 DIAGNOSIS — R5381 Other malaise: Secondary | ICD-10-CM | POA: Insufficient documentation

## 2013-01-28 DIAGNOSIS — M25669 Stiffness of unspecified knee, not elsewhere classified: Secondary | ICD-10-CM | POA: Insufficient documentation

## 2013-02-04 ENCOUNTER — Ambulatory Visit: Payer: BC Managed Care – PPO | Admitting: Physical Therapy

## 2013-02-11 ENCOUNTER — Ambulatory Visit: Payer: BC Managed Care – PPO | Admitting: Physical Therapy

## 2013-02-18 ENCOUNTER — Ambulatory Visit: Payer: BC Managed Care – PPO | Admitting: Physical Therapy

## 2013-04-29 ENCOUNTER — Encounter: Payer: Self-pay | Admitting: Internal Medicine

## 2013-04-29 ENCOUNTER — Ambulatory Visit (INDEPENDENT_AMBULATORY_CARE_PROVIDER_SITE_OTHER): Payer: BC Managed Care – PPO | Admitting: Internal Medicine

## 2013-04-29 VITALS — BP 175/79 | HR 125 | Wt 234.0 lb

## 2013-04-29 DIAGNOSIS — T8450XA Infection and inflammatory reaction due to unspecified internal joint prosthesis, initial encounter: Secondary | ICD-10-CM

## 2013-04-29 DIAGNOSIS — F29 Unspecified psychosis not due to a substance or known physiological condition: Secondary | ICD-10-CM

## 2013-04-29 DIAGNOSIS — T8452XA Infection and inflammatory reaction due to internal left hip prosthesis, initial encounter: Secondary | ICD-10-CM

## 2013-04-29 DIAGNOSIS — Z79899 Other long term (current) drug therapy: Secondary | ICD-10-CM

## 2013-04-29 NOTE — Progress Notes (Signed)
Patient ID: Ricky LordsMichael K Paisley, male   DOB: 1940/06/19, 73 y.o.   MRN: [id number]         [facility]  Patient Active Problem List   Diagnosis Date Noted  . DVT, bilateral lower limbs 09/21/2011  . 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 Dementia 09/16/2011    Patient's Medications  New Prescriptions   No medications on file  Previous Medications   ARIPIPRAZOLE (ABILIFY) 2 MG TABLET    Take 2 mg by mouth daily with breakfast. Patient uses 3 milligrams of this medication.   CEPHALEXIN (KEFLEX) 500 MG CAPSULE    take 1 capsule by mouth three times a day   HYDROCHLOROTHIAZIDE (HYDRODIURIL) 50 MG TABLET    Take 50 mg by mouth daily.   IBUPROFEN (ADVIL,MOTRIN) 200 MG TABLET    Take 200 mg by mouth every 6 (six) hours as needed.   LANSOPRAZOLE (PREVACID) 30 MG CAPSULE    Take 30 mg by mouth every morning.    MULTIVITAMIN (ONE-A-DAY MEN'S) TABS TABLET    Take 1 tablet by mouth daily.   POTASSIUM CHLORIDE (KLOR-CON) 8 MEQ TABLET    Take 8 mEq by mouth daily.  Modified Medications   No medications on file  Discontinued Medications   No medications on file    Subjective: Casimiro NeedleMichael was in for his routine visit with his wife, Larita FifeLynn. 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 He is now been on cephalexin for a little over one and a half years. He is tolerating it well without any obvious side effects and he has had no evidence of recurrent infection.  Review of Systems: Pertinent items are noted in HPI.  Past Medical History  Diagnosis Date  . Dementia   . History of blood clots   . Acid reflux   . A-fib   . Shortness of breath     History  Substance Use Topics  . Smoking status: Never Smoker   . Smokeless tobacco: Never Used  . Alcohol Use: 0.6 oz/week    1 Glasses of wine per week     Comment: occasional     No family history on file.  No Known Allergies  Objective: BP: 175/79 mmHg (03/03 1438) Pulse Rate: 125 (03/03 1438)  General: He is in good spirits. He defers to [place] to answer most questions Skin: No rash Lungs: Clear Cor: Regular S1-S2 no murmurs Joints and extremities: Left hip incision is fully healed without evidence of active inflammation. He walks with the aid of a walker    Assessment: He is tolerating chronic suppressive cephalexin therapy for prosthetic hip infection.  Plan: 1. Continue cephalexin 2. Followup in 6 months   Cliffton AstersJohn Casy Brunetto, MD [facility] [phone] pager   [phone] cell 04/29/2013, 2:56 PM

## 2013-04-29 NOTE — Addendum Note (Signed)
Addended by: Mariea ClontsGREEN, Carle Fenech D on: 04/29/2013 03:17 PM   Modules accepted: Orders

## 2013-04-30 LAB — COMPREHENSIVE METABOLIC PANEL
ALT: 24 U/L (ref 0–53)
AST: 22 U/L (ref 0–37)
Albumin: 4.3 g/dL (ref 3.5–5.2)
Alkaline Phosphatase: 117 U/L (ref 39–117)
BILIRUBIN TOTAL: 0.3 mg/dL (ref 0.2–1.2)
BUN: 19 mg/dL (ref 6–23)
CHLORIDE: 98 meq/L (ref 96–112)
CO2: 23 meq/L (ref 19–32)
CREATININE: 0.86 mg/dL (ref 0.50–1.35)
Calcium: 9.3 mg/dL (ref 8.4–10.5)
Glucose, Bld: 121 mg/dL — ABNORMAL HIGH (ref 70–99)
Potassium: 3.7 mEq/L (ref 3.5–5.3)
SODIUM: 135 meq/L (ref 135–145)
TOTAL PROTEIN: 7.1 g/dL (ref 6.0–8.3)

## 2013-04-30 LAB — LIPID PANEL
CHOLESTEROL: 210 mg/dL — AB (ref 0–200)
HDL: 48 mg/dL (ref >39)
LDL Cholesterol: 127 mg/dL — ABNORMAL HIGH (ref 0–99)
Total CHOL/HDL Ratio: 4.4 Ratio
Triglycerides: 173 mg/dL — ABNORMAL HIGH (ref <150)
VLDL: 35 mg/dL (ref 0–40)

## 2013-06-10 ENCOUNTER — Other Ambulatory Visit: Payer: Self-pay | Admitting: Internal Medicine

## 2013-06-12 ENCOUNTER — Ambulatory Visit: Payer: BC Managed Care – PPO | Attending: Orthopaedic Surgery | Admitting: Physical Therapy

## 2013-06-12 DIAGNOSIS — IMO0001 Reserved for inherently not codable concepts without codable children: Secondary | ICD-10-CM | POA: Diagnosis present

## 2013-06-12 DIAGNOSIS — Z96649 Presence of unspecified artificial hip joint: Secondary | ICD-10-CM | POA: Insufficient documentation

## 2013-06-12 DIAGNOSIS — R5381 Other malaise: Secondary | ICD-10-CM | POA: Insufficient documentation

## 2013-06-12 DIAGNOSIS — M2560 Stiffness of unspecified joint, not elsewhere classified: Secondary | ICD-10-CM | POA: Diagnosis not present

## 2013-06-12 DIAGNOSIS — R269 Unspecified abnormalities of gait and mobility: Secondary | ICD-10-CM | POA: Diagnosis not present

## 2013-06-12 DIAGNOSIS — M6281 Muscle weakness (generalized): Secondary | ICD-10-CM | POA: Diagnosis not present

## 2013-06-16 ENCOUNTER — Ambulatory Visit: Payer: BC Managed Care – PPO | Admitting: Physical Therapy

## 2013-06-16 DIAGNOSIS — IMO0001 Reserved for inherently not codable concepts without codable children: Secondary | ICD-10-CM | POA: Diagnosis not present

## 2013-06-18 ENCOUNTER — Ambulatory Visit: Payer: BC Managed Care – PPO | Admitting: Physical Therapy

## 2013-06-18 DIAGNOSIS — IMO0001 Reserved for inherently not codable concepts without codable children: Secondary | ICD-10-CM | POA: Diagnosis not present

## 2013-06-24 ENCOUNTER — Ambulatory Visit: Payer: BC Managed Care – PPO | Admitting: Physical Therapy

## 2013-06-24 DIAGNOSIS — IMO0001 Reserved for inherently not codable concepts without codable children: Secondary | ICD-10-CM | POA: Diagnosis not present

## 2013-06-30 ENCOUNTER — Ambulatory Visit: Payer: BC Managed Care – PPO | Attending: Orthopaedic Surgery | Admitting: Physical Therapy

## 2013-06-30 DIAGNOSIS — M2560 Stiffness of unspecified joint, not elsewhere classified: Secondary | ICD-10-CM | POA: Insufficient documentation

## 2013-06-30 DIAGNOSIS — IMO0001 Reserved for inherently not codable concepts without codable children: Secondary | ICD-10-CM | POA: Insufficient documentation

## 2013-06-30 DIAGNOSIS — R269 Unspecified abnormalities of gait and mobility: Secondary | ICD-10-CM | POA: Insufficient documentation

## 2013-06-30 DIAGNOSIS — R5381 Other malaise: Secondary | ICD-10-CM | POA: Insufficient documentation

## 2013-06-30 DIAGNOSIS — M6281 Muscle weakness (generalized): Secondary | ICD-10-CM | POA: Insufficient documentation

## 2013-06-30 DIAGNOSIS — Z96649 Presence of unspecified artificial hip joint: Secondary | ICD-10-CM | POA: Diagnosis not present

## 2013-07-02 ENCOUNTER — Ambulatory Visit: Payer: BC Managed Care – PPO | Admitting: Physical Therapy

## 2013-07-02 DIAGNOSIS — IMO0001 Reserved for inherently not codable concepts without codable children: Secondary | ICD-10-CM | POA: Diagnosis not present

## 2013-07-07 ENCOUNTER — Ambulatory Visit: Payer: BC Managed Care – PPO | Admitting: Physical Therapy

## 2013-07-07 DIAGNOSIS — IMO0001 Reserved for inherently not codable concepts without codable children: Secondary | ICD-10-CM | POA: Diagnosis not present

## 2013-07-09 ENCOUNTER — Ambulatory Visit: Payer: BC Managed Care – PPO | Admitting: Physical Therapy

## 2013-07-09 DIAGNOSIS — IMO0001 Reserved for inherently not codable concepts without codable children: Secondary | ICD-10-CM | POA: Diagnosis not present

## 2013-07-14 ENCOUNTER — Ambulatory Visit: Payer: BC Managed Care – PPO | Admitting: Physical Therapy

## 2013-07-14 DIAGNOSIS — IMO0001 Reserved for inherently not codable concepts without codable children: Secondary | ICD-10-CM | POA: Diagnosis not present

## 2013-07-16 ENCOUNTER — Ambulatory Visit: Payer: BC Managed Care – PPO | Admitting: Physical Therapy

## 2013-07-22 ENCOUNTER — Ambulatory Visit: Payer: BC Managed Care – PPO | Admitting: Physical Therapy

## 2013-07-22 DIAGNOSIS — IMO0001 Reserved for inherently not codable concepts without codable children: Secondary | ICD-10-CM | POA: Diagnosis not present

## 2013-07-23 ENCOUNTER — Ambulatory Visit: Payer: Medicare Other | Admitting: Physical Therapy

## 2013-07-24 ENCOUNTER — Ambulatory Visit: Payer: BC Managed Care – PPO | Admitting: Physical Therapy

## 2013-07-24 DIAGNOSIS — IMO0001 Reserved for inherently not codable concepts without codable children: Secondary | ICD-10-CM | POA: Diagnosis not present

## 2013-07-29 ENCOUNTER — Ambulatory Visit: Payer: BC Managed Care – PPO | Attending: Orthopaedic Surgery | Admitting: Physical Therapy

## 2013-07-29 DIAGNOSIS — M6281 Muscle weakness (generalized): Secondary | ICD-10-CM | POA: Insufficient documentation

## 2013-07-29 DIAGNOSIS — IMO0001 Reserved for inherently not codable concepts without codable children: Secondary | ICD-10-CM | POA: Diagnosis present

## 2013-07-29 DIAGNOSIS — Z96649 Presence of unspecified artificial hip joint: Secondary | ICD-10-CM | POA: Insufficient documentation

## 2013-07-29 DIAGNOSIS — R5381 Other malaise: Secondary | ICD-10-CM | POA: Insufficient documentation

## 2013-07-29 DIAGNOSIS — M2560 Stiffness of unspecified joint, not elsewhere classified: Secondary | ICD-10-CM | POA: Diagnosis not present

## 2013-07-29 DIAGNOSIS — R269 Unspecified abnormalities of gait and mobility: Secondary | ICD-10-CM | POA: Insufficient documentation

## 2013-07-31 ENCOUNTER — Ambulatory Visit: Payer: BC Managed Care – PPO | Admitting: Physical Therapy

## 2013-07-31 DIAGNOSIS — IMO0001 Reserved for inherently not codable concepts without codable children: Secondary | ICD-10-CM | POA: Diagnosis not present

## 2013-08-04 ENCOUNTER — Ambulatory Visit: Payer: BC Managed Care – PPO | Admitting: Physical Therapy

## 2013-08-04 DIAGNOSIS — IMO0001 Reserved for inherently not codable concepts without codable children: Secondary | ICD-10-CM | POA: Diagnosis not present

## 2013-08-06 ENCOUNTER — Ambulatory Visit: Payer: BC Managed Care – PPO | Admitting: Physical Therapy

## 2013-08-06 DIAGNOSIS — IMO0001 Reserved for inherently not codable concepts without codable children: Secondary | ICD-10-CM | POA: Diagnosis not present

## 2013-08-11 ENCOUNTER — Ambulatory Visit: Payer: BC Managed Care – PPO | Admitting: Physical Therapy

## 2013-08-11 DIAGNOSIS — IMO0001 Reserved for inherently not codable concepts without codable children: Secondary | ICD-10-CM | POA: Diagnosis not present

## 2013-08-13 ENCOUNTER — Ambulatory Visit: Payer: BC Managed Care – PPO | Admitting: Physical Therapy

## 2013-08-13 DIAGNOSIS — IMO0001 Reserved for inherently not codable concepts without codable children: Secondary | ICD-10-CM | POA: Diagnosis not present

## 2013-08-18 ENCOUNTER — Ambulatory Visit: Payer: BC Managed Care – PPO | Admitting: Physical Therapy

## 2013-08-18 DIAGNOSIS — IMO0001 Reserved for inherently not codable concepts without codable children: Secondary | ICD-10-CM | POA: Diagnosis not present

## 2013-08-20 ENCOUNTER — Ambulatory Visit: Payer: BC Managed Care – PPO | Admitting: Physical Therapy

## 2013-08-20 DIAGNOSIS — IMO0001 Reserved for inherently not codable concepts without codable children: Secondary | ICD-10-CM | POA: Diagnosis not present

## 2013-08-25 ENCOUNTER — Ambulatory Visit: Payer: BC Managed Care – PPO | Admitting: Rehabilitative and Restorative Service Providers"

## 2013-08-25 DIAGNOSIS — IMO0001 Reserved for inherently not codable concepts without codable children: Secondary | ICD-10-CM | POA: Diagnosis not present

## 2013-08-27 ENCOUNTER — Ambulatory Visit
Payer: BC Managed Care – PPO | Attending: Orthopaedic Surgery | Admitting: Rehabilitative and Restorative Service Providers"

## 2013-08-27 DIAGNOSIS — IMO0001 Reserved for inherently not codable concepts without codable children: Secondary | ICD-10-CM | POA: Insufficient documentation

## 2013-08-27 DIAGNOSIS — M6281 Muscle weakness (generalized): Secondary | ICD-10-CM | POA: Insufficient documentation

## 2013-08-27 DIAGNOSIS — R5381 Other malaise: Secondary | ICD-10-CM | POA: Diagnosis not present

## 2013-08-27 DIAGNOSIS — Z96649 Presence of unspecified artificial hip joint: Secondary | ICD-10-CM | POA: Insufficient documentation

## 2013-08-27 DIAGNOSIS — R269 Unspecified abnormalities of gait and mobility: Secondary | ICD-10-CM | POA: Diagnosis not present

## 2013-08-27 DIAGNOSIS — M2560 Stiffness of unspecified joint, not elsewhere classified: Secondary | ICD-10-CM | POA: Diagnosis not present

## 2013-09-01 ENCOUNTER — Ambulatory Visit: Payer: BC Managed Care – PPO | Admitting: Physical Therapy

## 2013-09-01 DIAGNOSIS — IMO0001 Reserved for inherently not codable concepts without codable children: Secondary | ICD-10-CM | POA: Diagnosis not present

## 2013-09-03 ENCOUNTER — Ambulatory Visit: Payer: BC Managed Care – PPO | Admitting: Physical Therapy

## 2013-09-03 DIAGNOSIS — IMO0001 Reserved for inherently not codable concepts without codable children: Secondary | ICD-10-CM | POA: Diagnosis not present

## 2013-09-08 ENCOUNTER — Ambulatory Visit: Payer: BC Managed Care – PPO | Admitting: Physical Therapy

## 2013-09-08 DIAGNOSIS — IMO0001 Reserved for inherently not codable concepts without codable children: Secondary | ICD-10-CM | POA: Diagnosis not present

## 2013-09-10 ENCOUNTER — Ambulatory Visit: Payer: BC Managed Care – PPO | Admitting: Physical Therapy

## 2013-09-10 DIAGNOSIS — IMO0001 Reserved for inherently not codable concepts without codable children: Secondary | ICD-10-CM | POA: Diagnosis not present

## 2013-09-15 ENCOUNTER — Ambulatory Visit: Payer: BC Managed Care – PPO | Admitting: Physical Therapy

## 2013-09-15 DIAGNOSIS — IMO0001 Reserved for inherently not codable concepts without codable children: Secondary | ICD-10-CM | POA: Diagnosis not present

## 2013-09-17 ENCOUNTER — Ambulatory Visit: Payer: Medicare Other | Admitting: Physical Therapy

## 2013-09-22 ENCOUNTER — Ambulatory Visit: Payer: BC Managed Care – PPO | Admitting: Physical Therapy

## 2013-09-22 DIAGNOSIS — IMO0001 Reserved for inherently not codable concepts without codable children: Secondary | ICD-10-CM | POA: Diagnosis not present

## 2013-09-24 ENCOUNTER — Ambulatory Visit: Payer: BC Managed Care – PPO | Admitting: Physical Therapy

## 2013-09-24 DIAGNOSIS — IMO0001 Reserved for inherently not codable concepts without codable children: Secondary | ICD-10-CM | POA: Diagnosis not present

## 2013-09-29 ENCOUNTER — Ambulatory Visit: Payer: BC Managed Care – PPO | Attending: Orthopaedic Surgery | Admitting: Physical Therapy

## 2013-09-29 DIAGNOSIS — M2560 Stiffness of unspecified joint, not elsewhere classified: Secondary | ICD-10-CM | POA: Insufficient documentation

## 2013-09-29 DIAGNOSIS — R5381 Other malaise: Secondary | ICD-10-CM | POA: Diagnosis not present

## 2013-09-29 DIAGNOSIS — IMO0001 Reserved for inherently not codable concepts without codable children: Secondary | ICD-10-CM | POA: Diagnosis present

## 2013-09-29 DIAGNOSIS — R269 Unspecified abnormalities of gait and mobility: Secondary | ICD-10-CM | POA: Diagnosis not present

## 2013-09-29 DIAGNOSIS — Z96649 Presence of unspecified artificial hip joint: Secondary | ICD-10-CM | POA: Diagnosis not present

## 2013-09-29 DIAGNOSIS — M6281 Muscle weakness (generalized): Secondary | ICD-10-CM | POA: Diagnosis not present

## 2013-10-02 ENCOUNTER — Ambulatory Visit: Payer: BC Managed Care – PPO | Admitting: Physical Therapy

## 2013-10-02 DIAGNOSIS — IMO0001 Reserved for inherently not codable concepts without codable children: Secondary | ICD-10-CM | POA: Diagnosis not present

## 2013-10-06 ENCOUNTER — Ambulatory Visit: Payer: BC Managed Care – PPO | Admitting: Physical Therapy

## 2013-10-06 DIAGNOSIS — IMO0001 Reserved for inherently not codable concepts without codable children: Secondary | ICD-10-CM | POA: Diagnosis not present

## 2013-10-08 ENCOUNTER — Ambulatory Visit: Payer: BC Managed Care – PPO | Admitting: Physical Therapy

## 2013-10-08 DIAGNOSIS — IMO0001 Reserved for inherently not codable concepts without codable children: Secondary | ICD-10-CM | POA: Diagnosis not present

## 2013-10-27 ENCOUNTER — Ambulatory Visit (INDEPENDENT_AMBULATORY_CARE_PROVIDER_SITE_OTHER): Payer: BC Managed Care – PPO | Admitting: Internal Medicine

## 2013-10-27 VITALS — BP 135/79 | HR 80 | Temp 97.6°F | Wt 217.0 lb

## 2013-10-27 DIAGNOSIS — T8450XA Infection and inflammatory reaction due to unspecified internal joint prosthesis, initial encounter: Secondary | ICD-10-CM | POA: Diagnosis not present

## 2013-10-27 DIAGNOSIS — T8452XA Infection and inflammatory reaction due to internal left hip prosthesis, initial encounter: Secondary | ICD-10-CM

## 2013-10-27 NOTE — Progress Notes (Signed)
Patient ID: Ricky Rodriguez, male   DOB: Jul 01, 1940, 73 y.o.   MRN: [id number]         [facility] for Infectious Disease  Patient Active Problem List   Diagnosis Date Noted  . DVT, bilateral lower limbs 09/21/2011  . 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 Dementia 09/16/2011    Patient's Medications  New Prescriptions   No medications on file  Previous Medications   ARIPIPRAZOLE (ABILIFY) 2 MG TABLET    Take 2 mg by mouth daily with breakfast. Patient uses 3 milligrams of this medication.   CEPHALEXIN (KEFLEX) 500 MG CAPSULE    take 1 capsule by mouth three times a day   HYDROCHLOROTHIAZIDE (HYDRODIURIL) 50 MG TABLET    Take 50 mg by mouth daily.   IBUPROFEN (ADVIL,MOTRIN) 200 MG TABLET    Take 200 mg by mouth every 6 (six) hours as needed.   LANSOPRAZOLE (PREVACID) 30 MG CAPSULE    Take 30 mg by mouth every morning.    MULTIVITAMIN (ONE-A-DAY MEN'S) TABS TABLET    Take 1 tablet by mouth daily.   POTASSIUM CHLORIDE (KLOR-CON) 8 MEQ TABLET    Take 8 mEq by mouth daily.  Modified Medications   No medications on file  Discontinued Medications   No medications on file    Subjective: Ricky Rodriguez is in for his routine visit with his wife, Ricky Rodriguez. He has now been a little over 2 years since he developed left prosthetic hip infection with methicillin sensitive coagulase-negative staph. He has been on chronic suppressive oral cephalexin ever since. He has had no problems tolerating cephalexin. He show no evidence of reactivation of his infection. Ricky Rodriguez states that most of his problems recently have been due to weakness in his right leg. He has been working with physical therapy. He has had 3 falls in the past 6 months but fortunately did not injure himself. Review of Systems: Pertinent items are noted in HPI.  Past Medical History  Diagnosis Date  . Dementia   . History of blood clots   . Acid reflux   . A-fib   . Shortness of breath     History   Substance Use Topics  . Smoking status: Never Smoker   . Smokeless tobacco: Never Used  . Alcohol Use: 0.6 oz/week    1 Glasses of wine per week     Comment: occasional    No family history on file.  No Known Allergies  Objective: Temp: 97.6 F (36.4 C) (08/31 1448) Temp src: Oral (08/31 1448) BP: 135/79 mmHg (08/31 1448) Pulse Rate: 80 (08/31 1448)  General: He is seated in his wheelchair. He is in no distress. He answers simple questions but Ricky Rodriguez does most of the talking Left hip incision fully healed without inflammation    Assessment: He is tolerating chronic suppressive cephalexin therapy. Ricky Rodriguez does not want to consider having him stop antibiotic therapy at this time. She does to call me if he develops diarrhea or any other signs of possible side effects.  Plan: 1. Continue cephalexin 2. Followup annually   Cliffton Asters, MD [facility] for Infectious Disease [facility] [phone] pager   [phone] cell 10/27/2013, 3:00 PM

## 2013-12-22 DIAGNOSIS — M21379 Foot drop, unspecified foot: Secondary | ICD-10-CM | POA: Insufficient documentation

## 2013-12-22 DIAGNOSIS — Z9889 Other specified postprocedural states: Secondary | ICD-10-CM | POA: Insufficient documentation

## 2014-04-28 ENCOUNTER — Other Ambulatory Visit: Payer: Self-pay | Admitting: Internal Medicine

## 2014-05-27 ENCOUNTER — Other Ambulatory Visit: Payer: Self-pay | Admitting: Nurse Practitioner

## 2014-05-27 ENCOUNTER — Ambulatory Visit
Admission: RE | Admit: 2014-05-27 | Discharge: 2014-05-27 | Disposition: A | Discharge status: Home / Self Care | Payer: Medicare Other | Source: Ambulatory Visit | Attending: Nurse Practitioner | Admitting: Nurse Practitioner

## 2014-05-27 DIAGNOSIS — R0789 Other chest pain: Secondary | ICD-10-CM

## 2014-06-01 ENCOUNTER — Ambulatory Visit: Payer: BLUE CROSS/BLUE SHIELD | Attending: Orthopaedic Surgery | Admitting: Physical Therapy

## 2014-06-01 ENCOUNTER — Encounter: Payer: Self-pay | Admitting: Physical Therapy

## 2014-06-01 DIAGNOSIS — R29818 Other symptoms and signs involving the nervous system: Secondary | ICD-10-CM | POA: Diagnosis not present

## 2014-06-01 DIAGNOSIS — R531 Weakness: Secondary | ICD-10-CM | POA: Diagnosis not present

## 2014-06-01 DIAGNOSIS — M21372 Foot drop, left foot: Secondary | ICD-10-CM | POA: Diagnosis not present

## 2014-06-01 DIAGNOSIS — R6889 Other general symptoms and signs: Secondary | ICD-10-CM

## 2014-06-01 DIAGNOSIS — R269 Unspecified abnormalities of gait and mobility: Secondary | ICD-10-CM

## 2014-06-01 DIAGNOSIS — R296 Repeated falls: Secondary | ICD-10-CM

## 2014-06-01 DIAGNOSIS — R2689 Other abnormalities of gait and mobility: Secondary | ICD-10-CM

## 2014-06-01 DIAGNOSIS — M2560 Stiffness of unspecified joint, not elsewhere classified: Secondary | ICD-10-CM | POA: Diagnosis not present

## 2014-06-02 ENCOUNTER — Ambulatory Visit: Payer: Medicare Other | Admitting: Physical Therapy
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
## 2014-06-10 ENCOUNTER — Ambulatory Visit: Payer: BLUE CROSS/BLUE SHIELD | Admitting: Physical Therapy

## 2014-06-10 ENCOUNTER — Encounter: Payer: Self-pay | Admitting: Physical Therapy

## 2014-06-10 DIAGNOSIS — R269 Unspecified abnormalities of gait and mobility: Secondary | ICD-10-CM

## 2014-06-10 DIAGNOSIS — R2689 Other abnormalities of gait and mobility: Secondary | ICD-10-CM

## 2014-06-10 DIAGNOSIS — R531 Weakness: Secondary | ICD-10-CM

## 2014-06-10 DIAGNOSIS — R6889 Other general symptoms and signs: Secondary | ICD-10-CM
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
## 2014-06-16 ENCOUNTER — Ambulatory Visit: Payer: BLUE CROSS/BLUE SHIELD | Admitting: Physical Therapy

## 2014-06-16 ENCOUNTER — Encounter: Payer: Self-pay | Admitting: Physical Therapy

## 2014-06-16 DIAGNOSIS — R269 Unspecified abnormalities of gait and mobility: Secondary | ICD-10-CM | POA: Diagnosis not present

## 2014-06-16 DIAGNOSIS — R6889 Other general symptoms and signs: Secondary | ICD-10-CM

## 2014-06-16 DIAGNOSIS — R531 Weakness: Secondary | ICD-10-CM

## 2014-06-16 NOTE — Therapy (Signed)
[facility] [address] [place], [place], [address] Phone: [phone]   Fax:  [phone]  Physical Therapy Treatment  Patient Details  Name: Ricky LordsMichael K Cadenas MRN: [id number] Date of Birth: [birth year] Referring Provider:  Charolett BumpersJohnson, Martin K, MD  Encounter Date: 06/16/2014      PT End of Session - 06/16/14 1145    Visit Number 3   Number of Visits 17   Date for PT Re-Evaluation 07/31/14   PT Start Time 1145   PT Stop Time 1215   PT Time Calculation (min) 30 min   Equipment Utilized During Treatment Gait belt   Activity Tolerance Treatment limited secondary to agitation   Behavior During Therapy Agitated      Past Medical History  Diagnosis Date  . Dementia   . History of blood clots   . Acid reflux   . A-fib   . Shortness of breath     Past Surgical History  Procedure Laterality Date  . Hip surgery  05/22/11 last    L hip total replacement x 4   . Knee surgery    . Tonsillectomy    . Appendectomy    . Hernia repair    . Wrist surgery    . Incision and drainage hip  09/20/2011    Procedure: IRRIGATION AND DEBRIDEMENT HIP WITH POLY EXCHANGE;  Surgeon: Eldred MangesMark C Yates, MD;  Location: [facility] OR;  Service: Orthopedics;  Laterality: Left;    There were no vitals filed for this visit.  Visit Diagnosis:  Abnormality of gait  Decreased functional activity tolerance  Weakness generalized      Subjective Assessment - 06/16/14 1150    Subjective Son reports he has tolerated AFO daily. Increase in Ablify continues to help.   Currently in Pain? No/denies                         [facility] - 06/16/14 1151    Transfers   Sit to Stand 4: Min assist;With upper extremity assist;With armrests;From chair/3-in-1  to RW   Stand to Sit 4: Min assist;With upper extremity assist;With armrests;To chair/3-in-1  from RW for stabilization   Ambulation/Gait   Ambulation/Gait Yes   Ambulation/Gait Assistance 4: Min assist   Ambulation/Gait Assistance Details manual assist with strap to control adduction   Ambulation Distance (Feet) 25 Feet  X 4   Assistive device Rolling walker  AFO & strap on lateral leg   Gait Pattern Step-to pattern;Decreased step length - right;Decreased step length - left;Decreased stance time - left;Decreased stride length;Decreased hip/knee flexion - left;Decreased dorsiflexion - left;Decreased weight shift to left;Left hip hike;Right flexed knee in stance;Lateral hip instability;Trunk rotated posteriorly on left;Trunk flexed;Wide base of support;Poor foot clearance - left  left hip internal rotation and adduction   Ambulation Surface Indoor;Level   Curb 3: Mod assist  4" aerobic step with RW & AFO                PT Education - 06/16/14 1145    Education provided Yes   Education Details assisting patient and benefit / purpose of strap / handle   Person(s) Educated Child(ren)   Methods Explanation;Demonstration   Comprehension Verbalized understanding;Need further instruction          PT Short Term Goals - 06/01/14 1400    PT SHORT TERM GOAL #1   Title wife or son donnes AFO correctly and patient wears shoe that AFO  fits into >50% of awake hours. (Target Date: 5/3//2016)   Time 4   Period Weeks   Status New   PT SHORT TERM GOAL #2   Title Sit to/from stand from w/c to walker with supervision / cues. (Target Date: 5/3//2016)   Time 4   Period Weeks   Status New   PT SHORT TERM GOAL #3   Title ambulates 82' with walker & AFO with minA / cues. (Target Date: 5/3//2016)   Time 4   Period Weeks   Status New   PT SHORT TERM GOAL #4   Title negotiates single step with RW & AFO with moderate assist. (Target Date: 5/3//2016)   Time 4   Period Weeks   Status New           PT Long Term Goals - 06/01/14 1400    PT LONG TERM GOAL #1   Title wife or son verbalize understanding of updated HEP (Target Date: 07/31/2014)   Time 8    Period Weeks   Status New   PT LONG TERM GOAL #2   Title tolerates wear of shoe that fits AFO >80% of awake hours and family donnes AFO into shoe correctly. (Target Date: 07/31/2014)   Time 8   Period Weeks   Status New   PT LONG TERM GOAL #3   Title ambulates 54' around furniture with RW & AFO with family assist safely. (Target Date: 07/31/2014)   Time 8   Period Weeks   Status New   PT LONG TERM GOAL #4   Title negotiates single step (4") and ramps with RW and AFO with family assist safely. (Target Date: 07/31/2014)   Time 8   Period Weeks   Status New               Plan - 06/16/14 1145    Clinical Impression Statement Strap on lateral leg that can be used to control adduction and improves safety of gait.   Pt will benefit from skilled therapeutic intervention in order to improve on the following deficits Abnormal gait;Decreased activity tolerance;Decreased balance;Decreased cognition;Decreased endurance;Decreased knowledge of use of DME;Decreased mobility;Decreased range of motion;Decreased safety awareness;Decreased strength   Rehab Potential Good   PT Frequency 2x / week   PT Duration 8 weeks   PT Treatment/Interventions ADLs/Self Care Home Management;DME Instruction;Gait training;Functional mobility training;Therapeutic activities;Therapeutic exercise;Balance training;Neuromuscular re-education;Patient/family education;Other (comment)  orthotic training   PT Next Visit Plan Educate family on AFO donning and care, gait training with AFO   Consulted and Agree with Plan of Care Patient;Family member/caregiver   Family Member Consulted son        Problem List Patient Active Problem List   Diagnosis Date Noted  . DVT, bilateral lower limbs 09/21/2011  . 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 Dementia 09/16/2011    Illeana Edick PT, DPT 06/16/2014, 11:09 PM  [facility] [address] [place], [place], [address] Phone: [phone]   Fax:  [phone]

## 2014-06-18 ENCOUNTER — Encounter: Payer: Self-pay | Admitting: Physical Therapy

## 2014-06-18 ENCOUNTER — Ambulatory Visit: Payer: BLUE CROSS/BLUE SHIELD | Admitting: Physical Therapy

## 2014-06-18 DIAGNOSIS — R269 Unspecified abnormalities of gait and mobility: Secondary | ICD-10-CM | POA: Diagnosis not present

## 2014-06-18 DIAGNOSIS — R2689 Other abnormalities of gait and mobility: Secondary | ICD-10-CM

## 2014-06-18 DIAGNOSIS — R6889 Other general symptoms and signs: Secondary | ICD-10-CM

## 2014-06-18 DIAGNOSIS — R531 Weakness: Secondary | ICD-10-CM

## 2014-06-18 NOTE — Therapy (Signed)
[facility] [address] [place], [place], [address] Phone: [phone]   Fax:  [phone]  Physical Therapy Treatment  Patient Details  Name: Ricky Rodriguez MRN: [id number] Date of Birth: 11/19/1940 Referring Provider:  Charolett Bumpers, MD  Encounter Date: 06/18/2014      PT End of Session - 06/18/14 1100    Visit Number 4   Number of Visits 17   Date for PT Re-Evaluation 07/31/14   PT Start Time 1100   PT Stop Time 1135   PT Time Calculation (min) 35 min   Equipment Utilized During Treatment Gait belt   Activity Tolerance Treatment limited secondary to agitation   Behavior During Therapy Agitated      Past Medical History  Diagnosis Date  . Dementia   . History of blood clots   . Acid reflux   . A-fib   . Shortness of breath     Past Surgical History  Procedure Laterality Date  . Hip surgery  05/22/11 last    L hip total replacement x 4   . Knee surgery    . Tonsillectomy    . Appendectomy    . Hernia repair    . Wrist surgery    . Incision and drainage hip  09/20/2011    Procedure: IRRIGATION AND DEBRIDEMENT HIP WITH POLY EXCHANGE;  Surgeon: Eldred Manges, MD;  [facility];  Service: Orthopedics;  Laterality: Left;    There were no vitals filed for this visit.  Visit Diagnosis:  Abnormality of gait  Decreased functional activity tolerance  Weakness generalized  Balance problems      Subjective Assessment - 06/18/14 1100    Subjective Son reports he is agitated today. Wife present today as furniture market closed yesterday.   Currently in Pain? No/denies       Sit to Stand 4: Min assist;With upper extremity assist;With armrests;From chair/3-in-1  to RW  Stand to Sit 4: Min assist;With upper extremity assist;With armrests;To chair/3-in-1  from RW for stabilization  Ambulation/Gait  Ambulation/Gait Yes  Ambulation/Gait Assistance 4: Min assist  Ambulation/Gait  Assistance Details manual assist with strap to control adduction  Ambulation Distance (Feet) 30 Feet X 4, 20' X 1  Assistive device Rolling walker  AFO & strap on lateral leg  Gait Pattern Step-to pattern;Decreased step length - right;Decreased step length - left;Decreased stance time - left;Decreased stride length;Decreased hip/knee flexion - left;Decreased dorsiflexion - left;Decreased weight shift to left;Left hip hike;Right flexed knee in stance;Lateral hip instability;Trunk rotated posteriorly on left;Trunk flexed;Wide base of support;Poor foot clearance - left  left hip internal rotation and adduction  Ambulation Surface Indoor;Level  Curb 3: Mod assist  4" aerobic step with RW & AFO         Orthotist present at end of session to determine strapping system PT is simulating  / recommending to aide gait & safety. Scarlette Slice took measurements to fabricate straps & handle.                            PT Education - 06/18/14 1100    Education provided Yes   Education Details benefits of strap & handle to decrease scissoring   Person(s) Educated Patient;Child(ren);Spouse   Methods Explanation;Demonstration   Comprehension Verbalized understanding;Need further instruction          PT Short Term Goals - 06/01/14 1400    PT SHORT TERM GOAL #1  Title wife or son donnes AFO correctly and patient wears shoe that AFO fits into >50% of awake hours. (Target Date: 5/3//2016)   Time 4   Period Weeks   Status New   PT SHORT TERM GOAL #2   Title Sit to/from stand from w/c to walker with supervision / cues. (Target Date: 5/3//2016)   Time 4   Period Weeks   Status New   PT SHORT TERM GOAL #3   Title ambulates 5540' with walker & AFO with minA / cues. (Target Date: 5/3//2016)   Time 4   Period Weeks   Status New   PT SHORT TERM GOAL #4   Title negotiates single step with RW & AFO with moderate assist. (Target Date: 5/3//2016)   Time 4   Period Weeks    Status New           PT Long Term Goals - 06/01/14 1400    PT LONG TERM GOAL #1   Title wife or son verbalize understanding of updated HEP (Target Date: 07/31/2014)   Time 8   Period Weeks   Status New   PT LONG TERM GOAL #2   Title tolerates wear of shoe that fits AFO >80% of awake hours and family donnes AFO into shoe correctly. (Target Date: 07/31/2014)   Time 8   Period Weeks   Status New   PT LONG TERM GOAL #3   Title ambulates 6340' around furniture with RW & AFO with family assist safely. (Target Date: 07/31/2014)   Time 8   Period Weeks   Status New   PT LONG TERM GOAL #4   Title negotiates single step (4") and ramps with RW and AFO with family assist safely. (Target Date: 07/31/2014)   Time 8   Period Weeks   Status New               Plan - 06/18/14 1100    Clinical Impression Statement Wife was pleased with how much strap & handle aide gait. Patient increased distance for gait today.   Pt will benefit from skilled therapeutic intervention in order to improve on the following deficits Abnormal gait;Decreased activity tolerance;Decreased balance;Decreased cognition;Decreased endurance;Decreased knowledge of use of DME;Decreased mobility;Decreased range of motion;Decreased safety awareness;Decreased strength   Rehab Potential Good   PT Frequency 2x / week   PT Duration 8 weeks   PT Treatment/Interventions ADLs/Self Care Home Management;DME Instruction;Gait training;Functional mobility training;Therapeutic activities;Therapeutic exercise;Balance training;Neuromuscular re-education;Patient/family education;Other (comment)  orthotic training   PT Next Visit Plan Educate family on AFO donning and care, gait training with AFO   Consulted and Agree with Plan of Care Patient;Family member/caregiver   Family Member Consulted son        Problem List Patient Active Problem List   Diagnosis Date Noted  . DVT, bilateral lower limbs 09/21/2011  . 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 Dementia 09/16/2011    Ricky Rodriguez,Willena Jeancharles PT, DPT 06/18/2014, 2:37 PM  [facility] [address] [place], [place], [address] Phone: [phone]   Fax:  [phone]

## 2014-06-23 ENCOUNTER — Ambulatory Visit: Payer: BLUE CROSS/BLUE SHIELD | Admitting: Physical Therapy

## 2014-06-23 DIAGNOSIS — R6889 Other general symptoms and signs: Secondary | ICD-10-CM

## 2014-06-23 DIAGNOSIS — R269 Unspecified abnormalities of gait and mobility: Secondary | ICD-10-CM | POA: Diagnosis not present

## 2014-06-23 DIAGNOSIS — R2689 Other abnormalities of gait and mobility: Secondary | ICD-10-CM

## 2014-06-24 NOTE — Therapy (Signed)
[facility] [address] [place], [place], [address] Phone: [phone]   Fax:  [phone]  Physical Therapy Treatment  Patient Details  Name: Ricky Rodriguez MRN: [id number] Date of Birth: 06-Dec-1940 Referring Provider:  Charolett Bumpers, MD  Encounter Date: 06/23/2014      PT End of Session - 06/23/14 1400    Visit Number 5   Number of Visits 17   Date for PT Re-Evaluation 07/31/14   PT Start Time 1400   PT Stop Time 1438   PT Time Calculation (min) 38 min   Equipment Utilized During Treatment Gait belt   Activity Tolerance Treatment limited secondary to agitation   Behavior During Therapy Agitated      Past Medical History  Diagnosis Date  . Dementia   . History of blood clots   . Acid reflux   . A-fib   . Shortness of breath     Past Surgical History  Procedure Laterality Date  . Hip surgery  05/22/11 last    L hip total replacement x 4   . Knee surgery    . Tonsillectomy    . Appendectomy    . Hernia repair    . Wrist surgery    . Incision and drainage hip  09/20/2011    Procedure: IRRIGATION AND DEBRIDEMENT HIP WITH POLY EXCHANGE;  Surgeon: Eldred Manges, MD;  [facility];  Service: Orthopedics;  Laterality: Left;    There were no vitals filed for this visit.  Visit Diagnosis:  Abnormality of gait  Decreased functional activity tolerance  Balance problems      Subjective Assessment - 06/23/14 1728    Subjective wife reports she was impressed with progress already made.   Currently in Pain? No/denies      Gait Training:  PT instructed wife while donning strap made by orthotist that goes around both left thigh and calf with attachment to loops and a handle. Patient ambulated 30' X 2 with RW / AFO & strap with PT assisting left LE swing for decrease adduction and improved clearance. Patient ambulated 15' with RW with wife assisting and PT cueing her assistance technique. Patient  negotiated 2 steps (3-4" steps with depth enough for RW - simulating home) with RW with PT providing MinA with loop / strap to aid left LE clearance and placement with minA. Wife assisted 2 step negotiation with PT cueing her technique & safety.                           PT Education - 06/23/14 1400    Education provided Yes   Education Details use of loop strap to assist left leg movement / adduction, how spouse can assist patient with improved safety   Person(s) Educated Spouse;Patient   Methods Explanation;Demonstration;Verbal cues;Tactile cues   Comprehension Verbalized understanding;Returned demonstration;Verbal cues required;Tactile cues required;Need further instruction          PT Short Term Goals - 06/01/14 1400    PT SHORT TERM GOAL #1   Title wife or son donnes AFO correctly and patient wears shoe that AFO fits into >50% of awake hours. (Target Date: 5/3//2016)   Time 4   Period Weeks   Status New   PT SHORT TERM GOAL #2   Title Sit to/from stand from w/c to walker with supervision / cues. (Target Date: 5/3//2016)   Time 4   Period Weeks   Status New  PT SHORT TERM GOAL #3   Title ambulates 1640' with walker & AFO with minA / cues. (Target Date: 5/3//2016)   Time 4   Period Weeks   Status New   PT SHORT TERM GOAL #4   Title negotiates single step with RW & AFO with moderate assist. (Target Date: 5/3//2016)   Time 4   Period Weeks   Status New           PT Long Term Goals - 06/01/14 1400    PT LONG TERM GOAL #1   Title wife or son verbalize understanding of updated HEP (Target Date: 07/31/2014)   Time 8   Period Weeks   Status New   PT LONG TERM GOAL #2   Title tolerates wear of shoe that fits AFO >80% of awake hours and family donnes AFO into shoe correctly. (Target Date: 07/31/2014)   Time 8   Period Weeks   Status New   PT LONG TERM GOAL #3   Title ambulates 4340' around furniture with RW & AFO with family assist safely. (Target Date:  07/31/2014)   Time 8   Period Weeks   Status New   PT LONG TERM GOAL #4   Title negotiates single step (4") and ramps with RW and AFO with family assist safely. (Target Date: 07/31/2014)   Time 8   Period Weeks   Status New               Plan - 06/23/14 1400    Clinical Impression Statement loop straps improved clearance and foot placement which improves safety. Wife appears to understand assisting patient.   Pt will benefit from skilled therapeutic intervention in order to improve on the following deficits Abnormal gait;Decreased activity tolerance;Decreased balance;Decreased cognition;Decreased endurance;Decreased knowledge of use of DME;Decreased mobility;Decreased range of motion;Decreased safety awareness;Decreased strength   Rehab Potential Good   PT Frequency 2x / week   PT Duration 8 weeks   PT Treatment/Interventions ADLs/Self Care Home Management;DME Instruction;Gait training;Functional mobility training;Therapeutic activities;Therapeutic exercise;Balance training;Neuromuscular re-education;Patient/family education;Other (comment)  orthotic training   PT Next Visit Plan Educate family on AFO donning and care, gait training with AFO   Consulted and Agree with Plan of Care Patient;Family member/caregiver   Family Member Consulted son        Problem List Patient Active Problem List   Diagnosis Date Noted  . DVT, bilateral lower limbs 09/21/2011  . 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 Dementia 09/16/2011    Vladimir FasterWALDRON,Raneem Mendolia PT, DPT 06/24/2014, 5:33 PM  [facility] [address] [place], [place], [address] Phone: [phone]   Fax:  [phone]

## 2014-06-25 ENCOUNTER — Ambulatory Visit: Payer: Medicare Other | Admitting: Physical Therapy

## 2014-06-30 ENCOUNTER — Encounter: Payer: Self-pay | Admitting: Physical Therapy

## 2014-06-30 ENCOUNTER — Ambulatory Visit: Payer: BLUE CROSS/BLUE SHIELD | Attending: Orthopaedic Surgery | Admitting: Physical Therapy

## 2014-06-30 DIAGNOSIS — R531 Weakness: Secondary | ICD-10-CM

## 2014-06-30 DIAGNOSIS — R296 Repeated falls: Secondary | ICD-10-CM | POA: Diagnosis not present

## 2014-06-30 DIAGNOSIS — M21372 Foot drop, left foot: Secondary | ICD-10-CM

## 2014-06-30 DIAGNOSIS — R6889 Other general symptoms and signs: Secondary | ICD-10-CM

## 2014-06-30 DIAGNOSIS — R269 Unspecified abnormalities of gait and mobility: Secondary | ICD-10-CM | POA: Diagnosis present

## 2014-06-30 DIAGNOSIS — M2560 Stiffness of unspecified joint, not elsewhere classified: Secondary | ICD-10-CM

## 2014-06-30 DIAGNOSIS — R2689 Other abnormalities of gait and mobility: Secondary | ICD-10-CM

## 2014-06-30 DIAGNOSIS — R29818 Other symptoms and signs involving the nervous system: Secondary | ICD-10-CM | POA: Diagnosis not present

## 2014-06-30 NOTE — Therapy (Signed)
[facility] [address] [place], [place], [address] Phone: [phone]   Fax:  [phone]  Physical Therapy Treatment  Patient Details  Name: Ricky Rodriguez MRN: [id number] Date of Birth: October 05, 1940 Referring Provider:  Garlan Fair, MD  Encounter Date: 06/30/2014      PT End of Session - 06/30/14 1315    Visit Number 6   Number of Visits 17   Date for PT Re-Evaluation 07/31/14   PT Start Time 1224   PT Stop Time 1400   PT Time Calculation (min) 45 min   Equipment Utilized During Treatment Gait belt   Activity Tolerance Patient tolerated treatment well   Behavior During Therapy Agitated      Past Medical History  Diagnosis Date  . Dementia   . History of blood clots   . Acid reflux   . A-fib   . Shortness of breath     Past Surgical History  Procedure Laterality Date  . Hip surgery  05/22/11 last    L hip total replacement x 4   . Knee surgery    . Tonsillectomy    . Appendectomy    . Hernia repair    . Wrist surgery    . Incision and drainage hip  09/20/2011    Procedure: IRRIGATION AND DEBRIDEMENT HIP WITH POLY EXCHANGE;  Surgeon: Marybelle Killings, MD;  Location: [facility];  Service: Orthopedics;  Laterality: Left;    There were no vitals filed for this visit.  Visit Diagnosis:  Abnormality of gait  Decreased functional activity tolerance  Balance problems  Weakness generalized  Decreased range of motion  Foot drop, left      Subjective Assessment - 06/30/14 1323    Subjective (p) Wife reports strap helped. He even did 1 step at friend's for book club.    Currently in Pain? (p) No/denies      Gait Training:  PT instructed wife while donning strap made by orthotist that goes around both left thigh and calf with attachment to loops and a handle. Patient ambulated 45' X 2 with RW / AFO & strap with PT assisting left LE swing for decrease adduction and improved clearance. Patient  negotiated curb 7.5"  2 reps with RW with PT providing MinA with loop / strap to aid left LE clearance and placement with minA.  Therapeutic Exercise: PT instructed wife in recommendation for recumbent seated stepper that uses both UEs & LEs to operate and proper set-up. Patient performed level 1 for 5 minutes with PT cueing.                                     PT Education - 06/30/14 1315    Education provided Yes   Education Details use of recumbent stepper with recommendation at [employer]) Educated Patient;Spouse   Methods Explanation;Verbal cues   Comprehension Verbalized understanding;Verbal cues required;Tactile cues required;Need further instruction          PT Short Term Goals - 06/30/14 1315    PT SHORT TERM GOAL #1   Title wife or son donnes AFO correctly and patient wears shoe that AFO fits into >50% of awake hours. (Target Date: 5/3//2016)   Baseline partially MET wife & son donne AFO correclty but donne for gait. Patient does not want to wear AFO during day most of time. 06/30/14   Time 4  Period Weeks   Status Partially Met   PT SHORT TERM GOAL #2   Title Sit to/from stand from w/c to walker with supervision / cues. (Target Date: 5/3//2016)   Baseline NOT MET patient requires minA to stand & CGA to sit. 06/30/14   Time 4   Period Weeks   Status Partially Met   PT SHORT TERM GOAL #3   Title ambulates 34' with walker & AFO with minA / cues. (Target Date: 5/3//2016)   Baseline MET 06/30/2014   Time 4   Period Weeks   Status Achieved   PT SHORT TERM GOAL #4   Title negotiates single step with RW & AFO with moderate assist. (Target Date: 5/3//2016)   Baseline MET 06/30/14   Time 4   Period Weeks   Status Achieved           PT Long Term Goals - 06/01/14 1400    PT LONG TERM GOAL #1   Title wife or son verbalize understanding of updated HEP (Target Date: 07/31/2014)   Time 8   Period Weeks   Status New   PT LONG TERM GOAL #2    Title tolerates wear of shoe that fits AFO >80% of awake hours and family donnes AFO into shoe correctly. (Target Date: 07/31/2014)   Time 8   Period Weeks   Status New   PT LONG TERM GOAL #3   Title ambulates 44' around furniture with [place] with family assist safely. (Target Date: 07/31/2014)   Time 8   Period Weeks   Status New   PT LONG TERM GOAL #4   Title negotiates single step (4") and ramps with RW and AFO with family assist safely. (Target Date: 07/31/2014)   Time 8   Period Weeks   Status New               Plan - 06/30/14 1315    Clinical Impression Statement patient met or partially met 4of5 STGs.    Pt will benefit from skilled therapeutic intervention in order to improve on the following deficits Abnormal gait;Decreased activity tolerance;Decreased balance;Decreased cognition;Decreased endurance;Decreased knowledge of use of DME;Decreased mobility;Decreased range of motion;Decreased safety awareness;Decreased strength   Rehab Potential Good   PT Frequency 2x / week   PT Duration 8 weeks   PT Treatment/Interventions ADLs/Self Care Home Management;DME Instruction;Gait training;Functional mobility training;Therapeutic activities;Therapeutic exercise;Balance training;Neuromuscular re-education;Patient/family education;Other (comment)  orthotic training   PT Next Visit Plan Educate family on AFO donning and care, gait training with AFO   Consulted and Agree with Plan of Care Patient;Family member/caregiver   Family Member Consulted son        Problem List Patient Active Problem List   Diagnosis Date Noted  . DVT, bilateral lower limbs 09/21/2011  . 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 Dementia 09/16/2011    Jamey Reas PT, DPT 06/30/2014, 6:21 PM  [facility] [address] [place], [place], [address] Phone: [phone]   Fax:  [phone]

## 2014-07-02 ENCOUNTER — Encounter: Payer: Self-pay | Admitting: Physical Therapy

## 2014-07-02 ENCOUNTER — Ambulatory Visit: Payer: BLUE CROSS/BLUE SHIELD | Admitting: Physical Therapy

## 2014-07-02 DIAGNOSIS — R2689 Other abnormalities of gait and mobility: Secondary | ICD-10-CM

## 2014-07-02 DIAGNOSIS — R269 Unspecified abnormalities of gait and mobility: Secondary | ICD-10-CM

## 2014-07-02 DIAGNOSIS — R6889 Other general symptoms and signs: Secondary | ICD-10-CM

## 2014-07-02 DIAGNOSIS — R531 Weakness: Secondary | ICD-10-CM

## 2014-07-03 NOTE — Therapy (Signed)
[facility] [address] [place], [place], [address] Phone: [phone]   Fax:  [phone]  Physical Therapy Treatment  Patient Details  Name: Ricky Rodriguez MRN: [id number] Date of Birth: 11-08-1940 Referring Provider:  Garlan Fair, MD  Encounter Date: 07/02/2014      PT End of Session - 07/02/14 1230    Visit Number 7   Number of Visits 17   Date for PT Re-Evaluation 07/31/14   PT Start Time 0737   PT Stop Time 1400   PT Time Calculation (min) 45 min   Equipment Utilized During Treatment Gait belt   Activity Tolerance Patient tolerated treatment well   Behavior During Therapy Agitated      Past Medical History  Diagnosis Date  . Dementia   . History of blood clots   . Acid reflux   . A-fib   . Shortness of breath     Past Surgical History  Procedure Laterality Date  . Hip surgery  05/22/11 last    L hip total replacement x 4   . Knee surgery    . Tonsillectomy    . Appendectomy    . Hernia repair    . Wrist surgery    . Incision and drainage hip  09/20/2011    Procedure: IRRIGATION AND DEBRIDEMENT HIP WITH POLY EXCHANGE;  Surgeon: Marybelle Killings, MD;  Location: [facility];  Service: Orthopedics;  Laterality: Left;    There were no vitals filed for this visit.  Visit Diagnosis:  Abnormality of gait  Decreased functional activity tolerance  Balance problems  Weakness generalized      Subjective Assessment - 07/02/14 1315    Subjective Wife checked into [employer] and plans to join.   Currently in Pain? No/denies     Gait Training: Sit to stand w/c & chairs with armrests to RW with modA. Stand to sit with CGA & verbal cues. Patient ambulated 17' X 3 with RW with AFO & strap to aid clearance and decrease adduction with minA.  Patient negotiated 2 single steps 7" ht with RW, AFO, strap with minA / verbal cues.  Therapeutic Exercise: [employer] recumbent stepper with BUEs & BLEs level 1.0 for 6 min  with verbal cues. PT instructed wife in set-up. She is going to check out [facility] as alternative to [facility] with lower cost.                            PT Education - 07/02/14 1315    Education provided Yes   Education Details assisting left foot with movement on curb negotiation   Person(s) Educated Patient;Spouse   Methods Explanation   Comprehension Verbalized understanding          PT Short Term Goals - 06/30/14 1315    PT SHORT TERM GOAL #1   Title wife or son donnes AFO correctly and patient wears shoe that AFO fits into >50% of awake hours. (Target Date: 5/3//2016)   Baseline partially MET wife & son donne AFO correclty but donne for gait. Patient does not want to wear AFO during day most of time. 06/30/14   Time 4   Period Weeks   Status Partially Met   PT SHORT TERM GOAL #2   Title Sit to/from stand from w/c to walker with supervision / cues. (Target Date: 5/3//2016)   Baseline NOT MET patient requires minA to stand & CGA to sit.  06/30/14   Time 4   Period Weeks   Status Partially Met   PT SHORT TERM GOAL #3   Title ambulates 51' with walker & AFO with minA / cues. (Target Date: 5/3//2016)   Baseline MET 06/30/2014   Time 4   Period Weeks   Status Achieved   PT SHORT TERM GOAL #4   Title negotiates single step with RW & AFO with moderate assist. (Target Date: 5/3//2016)   Baseline MET 06/30/14   Time 4   Period Weeks   Status Achieved           PT Long Term Goals - 06/01/14 1400    PT LONG TERM GOAL #1   Title wife or son verbalize understanding of updated HEP (Target Date: 07/31/2014)   Time 8   Period Weeks   Status New   PT LONG TERM GOAL #2   Title tolerates wear of shoe that fits AFO >80% of awake hours and family donnes AFO into shoe correctly. (Target Date: 07/31/2014)   Time 8   Period Weeks   Status New   PT LONG TERM GOAL #3   Title ambulates 84' around furniture with [place] with family assist safely. (Target Date:  07/31/2014)   Time 8   Period Weeks   Status New   PT LONG TERM GOAL #4   Title negotiates single step (4") and ramps with RW and AFO with family assist safely. (Target Date: 07/31/2014)   Time 8   Period Weeks   Status New               Plan - 07/02/14 1315    Clinical Impression Statement Patient improved gait on level surfaces and single steps. Moving left foot forward to edge of curb to ascend and to clear edge of step with descending improved negotiating step.   Pt will benefit from skilled therapeutic intervention in order to improve on the following deficits Abnormal gait;Decreased activity tolerance;Decreased balance;Decreased cognition;Decreased endurance;Decreased knowledge of use of DME;Decreased mobility;Decreased range of motion;Decreased safety awareness;Decreased strength   Rehab Potential Good   PT Frequency 2x / week   PT Duration 8 weeks   PT Treatment/Interventions ADLs/Self Care Home Management;DME Instruction;Gait training;Functional mobility training;Therapeutic activities;Therapeutic exercise;Balance training;Neuromuscular re-education;Patient/family education;Other (comment)  orthotic training   PT Next Visit Plan Educate family on AFO donning and care, gait training with AFO   Consulted and Agree with Plan of Care Patient;Family member/caregiver   Family Member Consulted wife        Problem List Patient Active Problem List   Diagnosis Date Noted  . DVT, bilateral lower limbs 09/21/2011  . 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 Dementia 09/16/2011    Jamey Reas PT, DPT 07/03/2014, 12:32 PM  [facility] [address] [place], [place], [address] Phone: [phone]   Fax:  [phone]

## 2014-07-07 ENCOUNTER — Encounter: Payer: Self-pay | Admitting: Physical Therapy

## 2014-07-07 ENCOUNTER — Ambulatory Visit: Payer: BLUE CROSS/BLUE SHIELD | Admitting: Physical Therapy

## 2014-07-07 DIAGNOSIS — R531 Weakness: Secondary | ICD-10-CM

## 2014-07-07 DIAGNOSIS — R269 Unspecified abnormalities of gait and mobility: Secondary | ICD-10-CM | POA: Diagnosis not present

## 2014-07-07 DIAGNOSIS — R2689 Other abnormalities of gait and mobility: Secondary | ICD-10-CM

## 2014-07-07 DIAGNOSIS — R6889 Other general symptoms and signs: Secondary | ICD-10-CM

## 2014-07-07 NOTE — Therapy (Signed)
[facility] [address] [place], [place], [address] Phone: [phone]   Fax:  [phone]  Physical Therapy Treatment  Patient Details  Name: Ricky Rodriguez MRN: [id number] Date of Birth: [birth year] Referring Provider:  Garlan Fair, MD  Encounter Date: 07/07/2014      PT End of Session - 07/07/14 1230    Visit Number 8   Number of Visits 17   Date for PT Re-Evaluation 07/31/14   PT Start Time 1230   PT Stop Time 1315   PT Time Calculation (min) 45 min   Equipment Utilized During Treatment Gait belt   Activity Tolerance Patient tolerated treatment well   Behavior During Therapy Agitated      Past Medical History  Diagnosis Date  . Dementia   . History of blood clots   . Acid reflux   . A-fib   . Shortness of breath     Past Surgical History  Procedure Laterality Date  . Hip surgery  05/22/11 last    L hip total replacement x 4   . Knee surgery    . Tonsillectomy    . Appendectomy    . Hernia repair    . Wrist surgery    . Incision and drainage hip  09/20/2011    Procedure: IRRIGATION AND DEBRIDEMENT HIP WITH POLY EXCHANGE;  Surgeon: Marybelle Killings, MD;  Location: [facility];  Service: Orthopedics;  Laterality: Left;    There were no vitals filed for this visit.  Visit Diagnosis:  Abnormality of gait  Decreased functional activity tolerance  Balance problems  Weakness generalized      Subjective Assessment - 07/07/14 1230    Subjective Wife reports they plan to check out [facility] as PT advised as an option to [employer].   Currently in Pain? No/denies      Gait Training: Son & wife observing to learn how to assist at home. Sit to stand w/c & chairs with armrests to RW with minA when given extended time. Stand to sit with CGA & verbal cues. Patient ambulated 79' X 3 with RW with AFO & strap to aid clearance and decrease adduction with minA. Patient negotiated 2 single steps 7" ht with  RW, AFO, strap with minA / verbal cues.  Therapeutic Exercise: NuStep recumbent stepper with BUEs & BLEs level 1.0 for 6 min with verbal cues. PT instructed wife & son in set-up. She is going to check out [facility] as alternative to [facility] with lower cost.                             PT Education - 07/07/14 1230    Education provided Yes   Education Details set-up & assisting patient on recumbent stepper   Person(s) Educated Patient;Spouse;Child(ren)   Methods Explanation;Demonstration   Comprehension Verbalized understanding;Verbal cues required;Need further instruction          PT Short Term Goals - 06/30/14 1315    PT SHORT TERM GOAL #1   Title wife or son donnes AFO correctly and patient wears shoe that AFO fits into >50% of awake hours. (Target Date: 5/3//2016)   Baseline partially MET wife & son donne AFO correclty but donne for gait. Patient does not want to wear AFO during day most of time. 06/30/14   Time 4   Period Weeks   Status Partially Met   PT SHORT TERM GOAL #2  Title Sit to/from stand from w/c to walker with supervision / cues. (Target Date: 5/3//2016)   Baseline NOT MET patient requires minA to stand & CGA to sit. 06/30/14   Time 4   Period Weeks   Status Partially Met   PT SHORT TERM GOAL #3   Title ambulates 25' with walker & AFO with minA / cues. (Target Date: 5/3//2016)   Baseline MET 06/30/2014   Time 4   Period Weeks   Status Achieved   PT SHORT TERM GOAL #4   Title negotiates single step with RW & AFO with moderate assist. (Target Date: 5/3//2016)   Baseline MET 06/30/14   Time 4   Period Weeks   Status Achieved           PT Long Term Goals - 07/07/14 1230    PT LONG TERM GOAL #1   Title wife or son verbalize understanding of updated HEP (Target Date: 07/31/2014)   Time 8   Period Weeks   Status On-going   PT LONG TERM GOAL #2   Title tolerates wear of shoe that fits AFO >80% of awake hours and family donnes AFO  into shoe correctly. (Target Date: 07/31/2014)   Time 8   Period Weeks   Status On-going   PT LONG TERM GOAL #3   Title ambulates 87' around furniture with [place] with family assist safely. (Target Date: 07/31/2014)   Time 8   Period Weeks   Status On-going   PT LONG TERM GOAL #4   Title negotiates 2 single step (7") and ramps with RW and AFO with family assist safely. (Target Date: 07/31/2014)   Time 8   Period Weeks   Status Revised               Plan - 07/07/14 1230    Clinical Impression Statement Patient's wife & son appear to understand how to set-up & assist patient with recumbent stepper. They also seem to understand recommendation to start with only activity at a time at fitness center.   Pt will benefit from skilled therapeutic intervention in order to improve on the following deficits Abnormal gait;Decreased activity tolerance;Decreased balance;Decreased cognition;Decreased endurance;Decreased knowledge of use of DME;Decreased mobility;Decreased range of motion;Decreased safety awareness;Decreased strength   Rehab Potential Good   PT Frequency 2x / week   PT Duration 8 weeks   PT Treatment/Interventions ADLs/Self Care Home Management;DME Instruction;Gait training;Functional mobility training;Therapeutic activities;Therapeutic exercise;Balance training;Neuromuscular re-education;Patient/family education;Other (comment)  orthotic training   PT Next Visit Plan  gait training with AFO &  strap   Consulted and Agree with Plan of Care Patient;Family member/caregiver   Family Member Consulted wife, son        Problem List Patient Active Problem List   Diagnosis Date Noted  . DVT, bilateral lower limbs 09/21/2011  . 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 Dementia 09/16/2011    Jamey Reas PT, DPT 07/07/2014, 9:27 PM  [facility] [address] [place], [place],  [address] Phone: [phone]   Fax:  [phone]

## 2014-07-09 ENCOUNTER — Ambulatory Visit: Payer: BLUE CROSS/BLUE SHIELD | Admitting: Physical Therapy

## 2014-07-09 DIAGNOSIS — R531 Weakness: Secondary | ICD-10-CM

## 2014-07-09 DIAGNOSIS — R269 Unspecified abnormalities of gait and mobility: Secondary | ICD-10-CM

## 2014-07-09 DIAGNOSIS — R6889 Other general symptoms and signs: Secondary | ICD-10-CM

## 2014-07-09 DIAGNOSIS — R2689 Other abnormalities of gait and mobility: Secondary | ICD-10-CM

## 2014-07-10 NOTE — Therapy (Signed)
[facility] [address] [place], [place], [address] Phone: [phone]   Fax:  [phone]  Physical Therapy Treatment  Patient Details  Name: Ricky Rodriguez MRN: [id number] Date of Birth: 06-24-1940 Referring Provider:  Garlan Fair, MD  Encounter Date: 07/09/2014      PT End of Session - 07/09/14 1400    Visit Number 9   Number of Visits 17   Date for PT Re-Evaluation 07/31/14   PT Start Time 1401   PT Stop Time 1435   PT Time Calculation (min) 34 min   Equipment Utilized During Treatment Gait belt   Activity Tolerance Patient tolerated treatment well   Behavior During Therapy Agitated      Past Medical History  Diagnosis Date  . Dementia   . History of blood clots   . Acid reflux   . A-fib   . Shortness of breath     Past Surgical History  Procedure Laterality Date  . Hip surgery  05/22/11 last    L hip total replacement x 4   . Knee surgery    . Tonsillectomy    . Appendectomy    . Hernia repair    . Wrist surgery    . Incision and drainage hip  09/20/2011    Procedure: IRRIGATION AND DEBRIDEMENT HIP WITH POLY EXCHANGE;  Surgeon: Marybelle Killings, MD;  Location: [facility];  Service: Orthopedics;  Laterality: Left;    There were no vitals filed for this visit.  Visit Diagnosis:  Abnormality of gait  Decreased functional activity tolerance  Balance problems  Weakness generalized      Subjective Assessment - 07/09/14 1400    Subjective Wife reports she has improved timing when to assist left LE with strap and it helps a lot.   Currently in Pain? No/denies     Orthotic Training with AFO & assistance strap: Patient ambulated 45', 40' plus negotiated single step, 44' & 17' with RW with minA. Sit to stand with minA if given extra time from chairs with armrests to RW. PT cued on posture, RW distance and positioning for safe sit down. Therapeutic Exercise: NuStep Level 1 with BUE & BLE for 7 min  with cues on full ROM. Wife assisted under PT direction with set-up.                            PT Education - 07/09/14 1400    Education provided Yes   Education Details recumbent stepper use   Person(s) Educated Spouse   Methods Explanation;Demonstration;Verbal cues   Comprehension Verbalized understanding;Returned demonstration;Verbal cues required;Tactile cues required          PT Short Term Goals - 06/30/14 1315    PT SHORT TERM GOAL #1   Title wife or son donnes AFO correctly and patient wears shoe that AFO fits into >50% of awake hours. (Target Date: 5/3//2016)   Baseline partially MET wife & son donne AFO correclty but donne for gait. Patient does not want to wear AFO during day most of time. 06/30/14   Time 4   Period Weeks   Status Partially Met   PT SHORT TERM GOAL #2   Title Sit to/from stand from w/c to walker with supervision / cues. (Target Date: 5/3//2016)   Baseline NOT MET patient requires minA to stand & CGA to sit. 06/30/14   Time 4   Period Weeks   Status Partially  Met   PT SHORT TERM GOAL #3   Title ambulates 97' with walker & AFO with minA / cues. (Target Date: 5/3//2016)   Baseline MET 06/30/2014   Time 4   Period Weeks   Status Achieved   PT SHORT TERM GOAL #4   Title negotiates single step with RW & AFO with moderate assist. (Target Date: 5/3//2016)   Baseline MET 06/30/14   Time 4   Period Weeks   Status Achieved           PT Long Term Goals - 07/07/14 1230    PT LONG TERM GOAL #1   Title wife or son verbalize understanding of updated HEP (Target Date: 07/31/2014)   Time 8   Period Weeks   Status On-going   PT LONG TERM GOAL #2   Title tolerates wear of shoe that fits AFO >80% of awake hours and family donnes AFO into shoe correctly. (Target Date: 07/31/2014)   Time 8   Period Weeks   Status On-going   PT LONG TERM GOAL #3   Title ambulates 44' around furniture with [place] with family assist safely. (Target Date:  07/31/2014)   Time 8   Period Weeks   Status On-going   PT LONG TERM GOAL #4   Title negotiates 2 single step (7") and ramps with RW and AFO with family assist safely. (Target Date: 07/31/2014)   Time 8   Period Weeks   Status Revised               Plan - 07/09/14 1400    Clinical Impression Statement Patient is on target to meet LTGs. Patient's gait appears safer with family able to assist with less risk of falls & injuries.   Pt will benefit from skilled therapeutic intervention in order to improve on the following deficits Abnormal gait;Decreased activity tolerance;Decreased balance;Decreased cognition;Decreased endurance;Decreased knowledge of use of DME;Decreased range of motion;Decreased safety awareness;Decreased strength   Rehab Potential Good   PT Frequency 2x / week   PT Duration 8 weeks   PT Treatment/Interventions ADLs/Self Care Home Management;DME Instruction;Gait training;Functional mobility training;Therapeutic activities;Therapeutic exercise;Balance training;Neuromuscular re-education;Patient/family education;Other (comment)  orthotic training   PT Next Visit Plan Do G code,  gait training with AFO &  strap   Consulted and Agree with Plan of Care Patient;Family member/caregiver   Family Member Consulted wife, son        Problem List Patient Active Problem List   Diagnosis Date Noted  . DVT, bilateral lower limbs 09/21/2011  . 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 Dementia 09/16/2011    Jamey Reas PT, DPT 07/10/2014, 7:02 AM  [facility] [address] [place], [place], [address] Phone: [phone]   Fax:  [phone]

## 2014-07-14 ENCOUNTER — Ambulatory Visit: Payer: Medicare Other | Admitting: Physical Therapy

## 2014-07-16 ENCOUNTER — Encounter: Payer: Self-pay | Admitting: Physical Therapy

## 2014-07-16 ENCOUNTER — Ambulatory Visit: Payer: BLUE CROSS/BLUE SHIELD | Admitting: Physical Therapy

## 2014-07-16 DIAGNOSIS — R269 Unspecified abnormalities of gait and mobility: Secondary | ICD-10-CM | POA: Diagnosis not present

## 2014-07-16 DIAGNOSIS — R6889 Other general symptoms and signs: Secondary | ICD-10-CM

## 2014-07-16 DIAGNOSIS — R531 Weakness: Secondary | ICD-10-CM

## 2014-07-16 DIAGNOSIS — R2689 Other abnormalities of gait and mobility: Secondary | ICD-10-CM

## 2014-07-16 NOTE — Therapy (Signed)
[facility] [address] [place], [place], [address] Phone: [phone]   Fax:  [phone]  Physical Therapy Treatment  Patient Details  Name: Ricky Rodriguez MRN: [id number] Date of Birth: Jul 13, 1940 Referring Provider:  Charolett Bumpers, MD  Encounter Date: 07/16/2014      PT End of Session - 07/16/14 1400    Visit Number 10   Number of Visits 17   Date for PT Re-Evaluation 07/31/14   PT Start Time 1400   PT Stop Time 1438   PT Time Calculation (min) 38 min   Equipment Utilized During Treatment Gait belt   Activity Tolerance Patient tolerated treatment well   Behavior During Therapy Agitated      Past Medical History  Diagnosis Date  . Dementia   . History of blood clots   . Acid reflux   . A-fib   . Shortness of breath     Past Surgical History  Procedure Laterality Date  . Hip surgery  05/22/11 last    L hip total replacement x 4   . Knee surgery    . Tonsillectomy    . Appendectomy    . Hernia repair    . Wrist surgery    . Incision and drainage hip  09/20/2011    Procedure: IRRIGATION AND DEBRIDEMENT HIP WITH POLY EXCHANGE;  Surgeon: Eldred Manges, MD;  [facility];  Service: Orthopedics;  Laterality: Left;    There were no vitals filed for this visit.  Visit Diagnosis:  Abnormality of gait  Decreased functional activity tolerance  Balance problems  Weakness generalized      Subjective Assessment - 07/16/14 1400    Subjective Son reports that assisting his father with gait is easier & safer with AFO & strap. They always use it for gait. Patient was aggitated over the weekend so did not try [facility] but plans to try over the next weekend.    Currently in Pain? No/denies      Gait Training: Sit to stand w/c to RW with min guard when given extra time to complete the transition to RW. Stand to sit with cues on positioning first then SBA. Patient ambulated 67' & 65' with RW,  AFO - strap to assist left LE step clearance & decrease adduction with MinA for LE and Min guard for balance / stability.  Patient negotiated step (7") 2 reps with RW, AFO /strap with minA.  Son observing session with PT cueing safe assisting / guarding techniques including on step. Therapeutic Exercise:  PT instructed son in set-up. Patient performed NuStep recumbent stepper Level 1 with BUEs & BLEs for 8 min with cues on full ROM.                              PT Short Term Goals - 06/30/14 1315    PT SHORT TERM GOAL #1   Title wife or son donnes AFO correctly and patient wears shoe that AFO fits into >50% of awake hours. (Target Date: 5/3//2016)   Baseline partially MET wife & son donne AFO correclty but donne for gait. Patient does not want to wear AFO during day most of time. 06/30/14   Time 4   Period Weeks   Status Partially Met   PT SHORT TERM GOAL #2   Title Sit to/from stand from w/c to walker with supervision / cues. (Target Date: 5/3//2016)  Baseline NOT MET patient requires minA to stand & CGA to sit. 06/30/14   Time 4   Period Weeks   Status Partially Met   PT SHORT TERM GOAL #3   Title ambulates 92' with walker & AFO with minA / cues. (Target Date: 5/3//2016)   Baseline MET 06/30/2014   Time 4   Period Weeks   Status Achieved   PT SHORT TERM GOAL #4   Title negotiates single step with RW & AFO with moderate assist. (Target Date: 5/3//2016)   Baseline MET 06/30/14   Time 4   Period Weeks   Status Achieved           PT Long Term Goals - 07/07/14 1230    PT LONG TERM GOAL #1   Title wife or son verbalize understanding of updated HEP (Target Date: 07/31/2014)   Time 8   Period Weeks   Status On-going   PT LONG TERM GOAL #2   Title tolerates wear of shoe that fits AFO >80% of awake hours and family donnes AFO into shoe correctly. (Target Date: 07/31/2014)   Time 8   Period Weeks   Status On-going   PT LONG TERM GOAL #3   Title ambulates 42'  around furniture with [place] with family assist safely. (Target Date: 07/31/2014)   Time 8   Period Weeks   Status On-going   PT LONG TERM GOAL #4   Title negotiates 2 single step (7") and ramps with RW and AFO with family assist safely. (Target Date: 07/31/2014)   Time 8   Period Weeks   Status Revised               Plan - 07/25/14 1400    Clinical Impression Statement Patient has improved consistency with gait with assistance from family utilizing the AFO & strap. Patient appears to use less energy and is more stable with gait with AFO & strap. Patient is on target for discharge.   Pt will benefit from skilled therapeutic intervention in order to improve on the following deficits Abnormal gait;Decreased activity tolerance;Decreased balance;Decreased cognition;Decreased endurance;Decreased knowledge of use of DME;Decreased range of motion;Decreased safety awareness;Decreased strength   Rehab Potential Good   PT Frequency 2x / week   PT Duration 8 weeks   PT Treatment/Interventions ADLs/Self Care Home Management;DME Instruction;Gait training;Functional mobility training;Therapeutic activities;Therapeutic exercise;Balance training;Neuromuscular re-education;Patient/family education;Other (comment)  orthotic training   PT Next Visit Plan Start mobility G code, gait training with AFO &  strap, NuStep use instruction   Consulted and Agree with Plan of Care Patient;Family member/caregiver   Family Member Consulted son          G-Codes - Jul 25, 2014 1400    Functional Assessment Tool Used Sit to/ from stand standard ht w/c to RW with min guard given increased time with UE on armrests.   Functional Limitation Changing and maintaining body position   Changing and Maintaining Body Position Goal Status [phone]) At least 40 percent but less than 60 percent impaired, limited or restricted   Changing and Maintaining Body Position Discharge Status (L8756) At least 40 percent but less than 60  percent impaired, limited or restricted      Problem List Patient Active Problem List   Diagnosis Date Noted  . DVT, bilateral lower limbs 09/21/2011  . 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 Dementia 09/16/2011    Brook Geraci PT, DPT 2014-07-25, 11:19 PM  [facility]  [facility] [address] [place], [place], [address] Phone: [phone]   Fax:  [phone]

## 2014-07-20 ENCOUNTER — Ambulatory Visit: Payer: BLUE CROSS/BLUE SHIELD | Admitting: Physical Therapy

## 2014-07-20 ENCOUNTER — Encounter: Payer: Self-pay | Admitting: Physical Therapy

## 2014-07-20 DIAGNOSIS — R531 Weakness: Secondary | ICD-10-CM

## 2014-07-20 DIAGNOSIS — R269 Unspecified abnormalities of gait and mobility: Secondary | ICD-10-CM | POA: Diagnosis not present

## 2014-07-20 DIAGNOSIS — R6889 Other general symptoms and signs: Secondary | ICD-10-CM

## 2014-07-20 DIAGNOSIS — R2689 Other abnormalities of gait and mobility: Secondary | ICD-10-CM

## 2014-07-20 NOTE — Therapy (Signed)
[facility] [address] [place], [place], [address] Phone: [phone]   Fax:  [phone]  Physical Therapy Treatment  Patient Details  Name: Ricky Rodriguez MRN: [id number] Date of Birth: [birth year] Referring Provider:  Garlan Fair, MD  Encounter Date: 07/20/2014      PT End of Session - 07/20/14 1230    Visit Number 11   Number of Visits 17   Date for PT Re-Evaluation 07/31/14   PT Start Time 1230   PT Stop Time 5945   PT Time Calculation (min) 35 min      Past Medical History  Diagnosis Date  . Dementia   . History of blood clots   . Acid reflux   . A-fib   . Shortness of breath     Past Surgical History  Procedure Laterality Date  . Hip surgery  05/22/11 last    L hip total replacement x 4   . Knee surgery    . Tonsillectomy    . Appendectomy    . Hernia repair    . Wrist surgery    . Incision and drainage hip  09/20/2011    Procedure: IRRIGATION AND DEBRIDEMENT HIP WITH POLY EXCHANGE;  Surgeon: Marybelle Killings, MD;  Location: [facility];  Service: Orthopedics;  Laterality: Left;    There were no vitals filed for this visit.  Visit Diagnosis:  Abnormality of gait  Decreased functional activity tolerance  Balance problems  Weakness generalized      Subjective Assessment - 07/20/14 1230    Subjective Went to [facility] but patient became aggitated. He was aggitated all weekend so the family did not try the steps into the family room as planned.   Currently in Pain? No/denies     Gait Training:  Patient sit to stand pushing on armrests from chair & w/c to RW with minA. Stand to sit using UE to control descent & cues. Patient ambulated 45', 35', & 45' with RW, AFO & strap to assist LLE advancement & step width. Patient negotiated 7" step 2 reps with RW, AFO & strap with minA.  Therapeutic Exercise: NuStep Level 1 with BUEs & BLEs for 8 minutes with cues  PT & wife discussing  manual w/c recommendations. Wife to get info on wound issues.                            PT Short Term Goals - 06/30/14 1315    PT SHORT TERM GOAL #1   Title wife or son donnes AFO correctly and patient wears shoe that AFO fits into >50% of awake hours. (Target Date: 5/3//2016)   Baseline partially MET wife & son donne AFO correclty but donne for gait. Patient does not want to wear AFO during day most of time. 06/30/14   Time 4   Period Weeks   Status Partially Met   PT SHORT TERM GOAL #2   Title Sit to/from stand from w/c to walker with supervision / cues. (Target Date: 5/3//2016)   Baseline NOT MET patient requires minA to stand & CGA to sit. 06/30/14   Time 4   Period Weeks   Status Partially Met   PT SHORT TERM GOAL #3   Title ambulates 73' with walker & AFO with minA / cues. (Target Date: 5/3//2016)   Baseline MET 06/30/2014   Time 4   Period Weeks   Status Achieved  PT SHORT TERM GOAL #4   Title negotiates single step with RW & AFO with moderate assist. (Target Date: 5/3//2016)   Baseline MET 06/30/14   Time 4   Period Weeks   Status Achieved           PT Long Term Goals - 07/07/14 1230    PT LONG TERM GOAL #1   Title wife or son verbalize understanding of updated HEP (Target Date: 07/31/2014)   Time 8   Period Weeks   Status On-going   PT LONG TERM GOAL #2   Title tolerates wear of shoe that fits AFO >80% of awake hours and family donnes AFO into shoe correctly. (Target Date: 07/31/2014)   Time 8   Period Weeks   Status On-going   PT LONG TERM GOAL #3   Title ambulates 40' around furniture with RW & AFO with family assist safely. (Target Date: 07/31/2014)   Time 8   Period Weeks   Status On-going   PT LONG TERM GOAL #4   Title negotiates 2 single step (7") and ramps with RW and AFO with family assist safely. (Target Date: 07/31/2014)   Time 8   Period Weeks   Status Revised               Plan - 07/20/14 1230    Clinical Impression  Statement Patient needs a new manual w/c with pressure relieving cushion due to wound issues with prolonged sitting.    Pt will benefit from skilled therapeutic intervention in order to improve on the following deficits Abnormal gait;Decreased activity tolerance;Decreased balance;Decreased cognition;Decreased endurance;Decreased knowledge of use of DME;Decreased range of motion;Decreased safety awareness;Decreased strength   Rehab Potential Good   PT Frequency 2x / week   PT Duration 8 weeks   PT Treatment/Interventions ADLs/Self Care Home Management;DME Instruction;Gait training;Functional mobility training;Therapeutic activities;Therapeutic exercise;Balance training;Neuromuscular re-education;Patient/family education;Other (comment)  orthotic training   PT Next Visit Plan Start mobility G code, gait training with AFO &  strap, NuStep use instruction   Consulted and Agree with Plan of Care Patient;Family member/caregiver   Family Member Consulted son          G-Codes - 07/20/14 1230    Functional Assessment Tool Used ambulates 50' with RW, AFO & assistive strap with minA.   Functional Limitation Mobility: Walking and moving around   Mobility: Walking and Moving Around Current Status (G8978) At least 60 percent but less than 80 percent impaired, limited or restricted   Mobility: Walking and Moving Around Goal Status (G8979) At least 60 percent but less than 80 percent impaired, limited or restricted      Problem List Patient Active Problem List   Diagnosis Date Noted  . DVT, bilateral lower limbs 09/21/2011  . 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 Dementia 09/16/2011    , PT, DPT 07/20/2014, 4:35 PM  [facility] [address] [place], [place], [address] Phone: [phone]   Fax:  [phone]      

## 2014-07-21 ENCOUNTER — Ambulatory Visit: Payer: Medicare Other | Admitting: Physical Therapy

## 2014-07-23 ENCOUNTER — Ambulatory Visit: Payer: BLUE CROSS/BLUE SHIELD | Admitting: Physical Therapy

## 2014-07-23 DIAGNOSIS — R2689 Other abnormalities of gait and mobility: Secondary | ICD-10-CM

## 2014-07-23 DIAGNOSIS — R269 Unspecified abnormalities of gait and mobility: Secondary | ICD-10-CM | POA: Diagnosis not present

## 2014-07-23 DIAGNOSIS — R6889 Other general symptoms and signs: Secondary | ICD-10-CM

## 2014-07-23 DIAGNOSIS — R531 Weakness: Secondary | ICD-10-CM

## 2014-07-24 ENCOUNTER — Encounter: Payer: Self-pay | Admitting: Physical Therapy

## 2014-07-24 NOTE — Therapy (Signed)
[facility] [address] [place], [place], [address] Phone: [phone]   Fax:  [phone]  Physical Therapy Treatment  Patient Details  Name: Ricky Rodriguez MRN: [id number] Date of Birth: Nov 12, 1940 Referring Provider:  Garlan Fair, MD  Encounter Date: 07/23/2014      PT End of Session - 07/23/14 1400    Visit Number 12   Number of Visits 17   Date for PT Re-Evaluation 07/31/14   PT Start Time 1400   PT Stop Time 1440   PT Time Calculation (min) 40 min   Equipment Utilized During Treatment Gait belt   Activity Tolerance Patient tolerated treatment well   Behavior During Therapy Agitated      Past Medical History  Diagnosis Date  . Dementia   . History of blood clots   . Acid reflux   . A-fib   . Shortness of breath     Past Surgical History  Procedure Laterality Date  . Hip surgery  05/22/11 last    L hip total replacement x 4   . Knee surgery    . Tonsillectomy    . Appendectomy    . Hernia repair    . Wrist surgery    . Incision and drainage hip  09/20/2011    Procedure: IRRIGATION AND DEBRIDEMENT HIP WITH POLY EXCHANGE;  Surgeon: Marybelle Killings, MD;  Location: [facility];  Service: Orthopedics;  Laterality: Left;    There were no vitals filed for this visit.  Visit Diagnosis:  Abnormality of gait  Decreased functional activity tolerance  Balance problems  Weakness generalized      Subjective Assessment - 07/23/14 1400    Subjective Wife wants a new w/c as his current is in disrepair and he also has skin integrity issues with current cushion. This w/c was given to them when he came home from hospital with [facility] Jan. 2011.   Currently in Pain? No/denies                         [facility] Adult PT Treatment/Exercise - 07/23/14 1400    Transfers   Sit to Stand 4: Min assist;With upper extremity assist;With armrests;From chair/3-in-1  to RW   Stand to Sit 4: Min assist;With  upper extremity assist;With armrests;To chair/3-in-1  from [place] for stabilization   Ambulation/Gait   Ambulation/Gait Yes   Ambulation/Gait Assistance 4: Min assist   Ambulation Distance (Feet) 50 Feet  X 3   Assistive device Rolling walker  AFO & strap on lateral leg to assist w/ position & clearance   Gait Pattern Step-to pattern;Decreased step length - right;Decreased step length - left;Decreased stance time - left;Decreased stride length;Decreased hip/knee flexion - left;Decreased dorsiflexion - left;Decreased weight shift to left;Left hip hike;Right flexed knee in stance;Lateral hip instability;Trunk rotated posteriorly on left;Trunk flexed;Wide base of support;Poor foot clearance - left  left hip internal rotation and adduction   Ambulation Surface Indoor;Level   Curb 4: Min assist  7" step with RW & AFO   Curb Details (indicate cue type and reason) verbal cues on sequence & assist with clearance / step length   Knee/Hip Exercises: Aerobic   Stationary Bike NuStep Level 1 with BUEs & BLEs X 8 min with cues on ROM   PT cued wife on set-up                PT Education - 07/23/14 1400    Education provided  Yes   Education Details w/c recommendation for light wt manual w/c with pressure relieving cushion.   Person(s) Educated Patient;Spouse   Methods Explanation   Comprehension Verbalized understanding          PT Short Term Goals - 06/30/14 1315    PT SHORT TERM GOAL #1   Title wife or son donnes AFO correctly and patient wears shoe that AFO fits into >50% of awake hours. (Target Date: 5/3//2016)   Baseline partially MET wife & son donne AFO correclty but donne for gait. Patient does not want to wear AFO during day most of time. 06/30/14   Time 4   Period Weeks   Status Partially Met   PT SHORT TERM GOAL #2   Title Sit to/from stand from w/c to walker with supervision / cues. (Target Date: 5/3//2016)   Baseline NOT MET patient requires minA to stand & CGA to sit. 06/30/14    Time 4   Period Weeks   Status Partially Met   PT SHORT TERM GOAL #3   Title ambulates 66' with walker & AFO with minA / cues. (Target Date: 5/3//2016)   Baseline MET 06/30/2014   Time 4   Period Weeks   Status Achieved   PT SHORT TERM GOAL #4   Title negotiates single step with RW & AFO with moderate assist. (Target Date: 5/3//2016)   Baseline MET 06/30/14   Time 4   Period Weeks   Status Achieved           PT Long Term Goals - 07/07/14 1230    PT LONG TERM GOAL #1   Title wife or son verbalize understanding of updated HEP (Target Date: 07/31/2014)   Time 8   Period Weeks   Status On-going   PT LONG TERM GOAL #2   Title tolerates wear of shoe that fits AFO >80% of awake hours and family donnes AFO into shoe correctly. (Target Date: 07/31/2014)   Time 8   Period Weeks   Status On-going   PT LONG TERM GOAL #3   Title ambulates 41' around furniture with [place] with family assist safely. (Target Date: 07/31/2014)   Time 8   Period Weeks   Status On-going   PT LONG TERM GOAL #4   Title negotiates 2 single step (7") and ramps with RW and AFO with family assist safely. (Target Date: 07/31/2014)   Time 8   Period Weeks   Status Revised               Plan - 07/23/14 1400    Clinical Impression Statement Patient is on target to meet LTGs next week. Patient needs new manual w/c & appropriate seating system.    Pt will benefit from skilled therapeutic intervention in order to improve on the following deficits Abnormal gait;Decreased activity tolerance;Decreased balance;Decreased cognition;Decreased endurance;Decreased knowledge of use of DME;Decreased range of motion;Decreased safety awareness;Decreased strength   Rehab Potential Good   PT Frequency 2x / week   PT Duration 8 weeks   PT Treatment/Interventions ADLs/Self Care Home Management;DME Instruction;Gait training;Functional mobility training;Therapeutic activities;Therapeutic exercise;Balance training;Neuromuscular  re-education;Patient/family education;Other (comment)  orthotic training   PT Next Visit Plan gait training with AFO &  strap, NuStep use instruction, complete manual w/c letter of necessity   Consulted and Agree with Plan of Care Patient;Family member/caregiver   Family Member Consulted wife        Problem List Patient Active Problem List   Diagnosis Date Noted  .  DVT, bilateral lower limbs 09/21/2011  . 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 Dementia 09/16/2011    Jamey Reas PT, DPT 07/24/2014, 7:07 PM  [facility] [address] [name], [place], [address] Phone: [phone]   Fax:  [phone]

## 2014-07-28 ENCOUNTER — Encounter: Payer: Self-pay | Admitting: Physical Therapy

## 2014-07-28 ENCOUNTER — Ambulatory Visit: Payer: BLUE CROSS/BLUE SHIELD | Admitting: Physical Therapy

## 2014-07-28 DIAGNOSIS — R269 Unspecified abnormalities of gait and mobility: Secondary | ICD-10-CM

## 2014-07-28 DIAGNOSIS — R6889 Other general symptoms and signs: Secondary | ICD-10-CM

## 2014-07-28 DIAGNOSIS — R2689 Other abnormalities of gait and mobility: Secondary | ICD-10-CM

## 2014-07-28 DIAGNOSIS — M2560 Stiffness of unspecified joint, not elsewhere classified: Secondary | ICD-10-CM

## 2014-07-28 DIAGNOSIS — R531 Weakness: Secondary | ICD-10-CM

## 2014-07-28 NOTE — Therapy (Signed)
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 verbal cues on technique & manual assist with left LE movement   Knee/Hip Exercises: Aerobic   Stationary Bike NuStep Level 1 with BUEs & BLEs X 8 min with cues on ROM   PT cued wife on set-up                 PT Education - 07/28/14 1230    Education provided Yes   Education Details w/c recommendation   Person(s) Educated Patient;Spouse;Child(ren)   Methods Explanation;Demonstration   Comprehension Verbalized understanding          PT Short Term Goals - 06/30/14 1315    PT SHORT TERM GOAL #1   Title wife or son donnes AFO correctly and patient wears shoe that AFO fits into >50% of awake hours. (Target Date: 5/3//2016)   Baseline partially MET wife & son donne AFO correclty but donne for gait. Patient does not want to wear AFO during day most of time. 06/30/14   Time 4   Period Weeks   Status Partially Met   PT SHORT TERM GOAL #2   Title Sit to/from stand from w/c to walker with supervision / cues. (Target Date: 5/3//2016)   Baseline NOT MET patient requires minA to  stand & CGA to sit. 06/30/14   Time 4   Period Weeks   Status Partially Met   PT SHORT TERM GOAL #3   Title ambulates 61' with walker & AFO with minA / cues. (Target Date: 5/3//2016)   Baseline MET 06/30/2014   Time 4   Period Weeks   Status Achieved   PT SHORT TERM GOAL  #4   Title negotiates single step with RW & AFO with moderate assist. (Target Date: 5/3//2016)   Baseline MET 06/30/14   Time 4   Period Weeks   Status Achieved      Stus     PT Long Term Goals - 07/07/14 1230    PT LONG TERM GOAL #1   Title wife or son verbalize understanding of updated HEP (Target Date: 07/31/2014)   Time 8   Period Weeks   Status On-going   PT LONG TERM GOAL #2   Title tolerates wear of shoe that fits AFO >80% of awake hours and family donnes AFO into shoe correctly. (Target Date: 07/31/2014)   Time 8   Period Weeks   Status On-going   PT LONG TERM GOAL #3   Title ambulates 52' around furniture with [place] with family assist safely. (Target Date: 07/31/2014)   Time 8   Period Weeks   Status On-going   PT LONG TERM GOAL #4   Title negotiates 2 single step (7") and ramps with RW and AFO with family assist safely. (Target Date: 07/31/2014)   Time 8   Period Weeks   Status Revised               Plan - 07/28/14 1230    Clinical Impression Statement See light weight w/c recommendation. patient is on target for discharge this week   Pt will benefit from skilled therapeutic intervention in order to improve on the following deficits Abnormal gait;Decreased activity tolerance;Decreased balance;Decreased cognition;Decreased endurance;Decreased knowledge of use of DME;Decreased range of motion;Decreased safety awareness;Decreased strength   Rehab Potential Good   PT Frequency 2x / week   PT Duration 8 weeks   PT Treatment/Interventions ADLs/Self Care Home Management;DME Instruction;Gait training;Functional mobility training;Therapeutic activities;Therapeutic exercise;Balance training;Neuromuscular re-education;Patient/family education;Other (comment)  orthotic training   PT Next Visit Plan assess for discharge   Consulted and Agree with Plan of Care Patient;Family member/caregiver   Family Member Consulted wife, son             Prob Patient Active Problem  List   Diagnosis Date Noted  . DVT, bilateral lower limbs 09/21/2011  . 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 Dementia 09/16/2011  Awanda Mink P5/31/20161/25:02 PM54 PM  [facility] [employer] [address], [id number] Phone:[phone]   Fax:  [phone]

## 2014-07-30 ENCOUNTER — Ambulatory Visit: Payer: BLUE CROSS/BLUE SHIELD | Attending: Orthopaedic Surgery | Admitting: Physical Therapy

## 2014-07-30 ENCOUNTER — Encounter: Payer: Self-pay | Admitting: Physical Therapy

## 2014-07-30 DIAGNOSIS — M2560 Stiffness of unspecified joint, not elsewhere classified: Secondary | ICD-10-CM

## 2014-07-30 DIAGNOSIS — R29818 Other symptoms and signs involving the nervous system: Secondary | ICD-10-CM | POA: Insufficient documentation

## 2014-07-30 DIAGNOSIS — R269 Unspecified abnormalities of gait and mobility: Secondary | ICD-10-CM

## 2014-07-30 DIAGNOSIS — R6889 Other general symptoms and signs: Secondary | ICD-10-CM | POA: Diagnosis present

## 2014-07-30 DIAGNOSIS — R531 Weakness: Secondary | ICD-10-CM

## 2014-07-30 DIAGNOSIS — M21372 Foot drop, left foot: Secondary | ICD-10-CM

## 2014-07-30 DIAGNOSIS — R2689 Other abnormalities of gait and mobility: Secondary | ICD-10-CM

## 2014-07-31 ENCOUNTER — Encounter: Payer: Self-pay | Admitting: Physical Therapy

## 2014-07-31 NOTE — Therapy (Signed)
[facility] [address] [place], [place], [address] Phone: [phone]   Fax:  [phone]  Physical Therapy Treatment  Patient Details  Name: Ricky Rodriguez MRN: [id number] Date of Birth: 18-Mar-1940 Referring Provider:  Garlan Fair, MD  Encounter Date: 07/30/2014      PT End of Session - 07/30/14 1400    Visit Number 14   Number of Visits 17   Date for PT Re-Evaluation 07/31/14   PT Start Time 1400   PT Stop Time 1444   PT Time Calculation (min) 44 min   Equipment Utilized During Treatment Gait belt   Activity Tolerance Patient tolerated treatment well   Behavior During Therapy Agitated      Past Medical History  Diagnosis Date  . Dementia   . History of blood clots   . Acid reflux   . A-fib   . Shortness of breath     Past Surgical History  Procedure Laterality Date  . Hip surgery  05/22/11 last    L hip total replacement x 4   . Knee surgery    . Tonsillectomy    . Appendectomy    . Hernia repair    . Wrist surgery    . Incision and drainage hip  09/20/2011    Procedure: IRRIGATION AND DEBRIDEMENT HIP WITH POLY EXCHANGE;  Surgeon: Marybelle Killings, MD;  Location: [facility];  Service: Orthopedics;  Laterality: Left;    There were no vitals filed for this visit.  Visit Diagnosis:  Abnormality of gait  Decreased functional activity tolerance  Balance problems  Weakness generalized  Decreased range of motion  Foot drop, left      Subjective Assessment - 07/30/14 1400    Subjective Wife reports PT has helped to achieve the family goals for assisting [name]   Currently in Pain? No/denies     Patient's wife was tied up at work & she was late to appointment. Patient did not want to work until she arrived (Dementia / Alzheimer's behavior) and LTGs involved family assisting patient.                    [facility] [place] Adult PT Treatment/Exercise - 07/30/14 1400    Transfers   Sit to  Stand 4: Min assist;With upper extremity assist;With armrests;From chair/3-in-1  to RW   Stand to Sit 4: Min assist;With upper extremity assist;With armrests;To chair/3-in-1  from [place] for stabilization   Ambulation/Gait   Ambulation/Gait Yes   Ambulation/Gait Assistance 4: Min assist   Ambulation Distance (Feet) 50 Feet  X 2   Assistive device Rolling walker  AFO & strap on lateral leg to assist w/ position & clearance   Gait Pattern Step-to pattern;Decreased step length - right;Decreased step length - left;Decreased stance time - left;Decreased stride length;Decreased hip/knee flexion - left;Decreased dorsiflexion - left;Decreased weight shift to left;Left hip hike;Right flexed knee in stance;Lateral hip instability;Trunk rotated posteriorly on left;Trunk flexed;Wide base of support;Poor foot clearance - left  left hip internal rotation and adduction   Ambulation Surface Indoor;Level   Curb 4: Min assist  7" step with RW & AFO, 2 reps   Knee/Hip Exercises: Aerobic   Stationary Bike --                PT Education - 07/30/14 1400    Education provided Yes   Education Details Recommend ongoing exercise & activities   Person(s) Educated Patient;Parent(s)   Methods Explanation   Comprehension  Verbalized understanding          PT Short Term Goals - 06/30/14 1315    PT SHORT TERM GOAL #1   Title wife or son donnes AFO correctly and patient wears shoe that AFO fits into >50% of awake hours. (Target Date: 5/3//2016)   Baseline partially MET wife & son donne AFO correclty but donne for gait. Patient does not want to wear AFO during day most of time. 06/30/14   Time 4   Period Weeks   Status Partially Met   PT SHORT TERM GOAL #2   Title Sit to/from stand from w/c to walker with supervision / cues. (Target Date: 5/3//2016)   Baseline NOT MET patient requires minA to stand & CGA to sit. 06/30/14   Time 4   Period Weeks   Status Partially Met   PT SHORT TERM GOAL #3   Title  ambulates 24' with walker & AFO with minA / cues. (Target Date: 5/3//2016)   Baseline MET 06/30/2014   Time 4   Period Weeks   Status Achieved   PT SHORT TERM GOAL #4   Title negotiates single step with RW & AFO with moderate assist. (Target Date: 5/3//2016)   Baseline MET 06/30/14   Time 4   Period Weeks   Status Achieved           PT Long Term Goals - 07/30/14 1400    PT LONG TERM GOAL #1   Title wife or son verbalize understanding of updated HEP (Target Date: 07/31/2014)   Baseline MET 07/30/2014   Time 8   Period Weeks   Status Achieved   PT LONG TERM GOAL #2   Title tolerates wear of shoe that fits AFO >80% of awake hours and family donnes AFO into shoe correctly. (Target Date: 07/31/2014)   Baseline Partially MET 07/30/2014 Patient does not wear AFO during day but is willing to put it on leg prior to any standing / gait activities. Family report able to easily donne.   Time 8   Period Weeks   Status Partially Met   PT LONG TERM GOAL #3   Title ambulates 52' around furniture with [place] with family assist safely. (Target Date: 07/31/2014)   Baseline MET 07/30/2014   Time 8   Period Weeks   Status Achieved   PT LONG TERM GOAL #4   Title negotiates 2 single step (7") and ramps with RW and AFO with family assist safely. (Target Date: 07/31/2014)   Baseline MET 07/30/2014   Time 8   Period Weeks   Status Achieved               Plan - 07/30/14 1400    Clinical Impression Statement Patient met all LTGs. Family reports improved ability to assist patient with gait in home including in/out of family room with 2 single steps.   Pt will benefit from skilled therapeutic intervention in order to improve on the following deficits Abnormal gait;Decreased activity tolerance;Decreased balance;Decreased cognition;Decreased endurance;Decreased knowledge of use of DME;Decreased range of motion;Decreased safety awareness;Decreased strength   Rehab Potential Good   PT Frequency 2x / week   PT  Duration 8 weeks   PT Treatment/Interventions ADLs/Self Care Home Management;DME Instruction;Gait training;Functional mobility training;Therapeutic activities;Therapeutic exercise;Balance training;Neuromuscular re-education;Patient/family education;Other (comment)  orthotic training   PT Next Visit Plan assess for discharge   Consulted and Agree with Plan of Care Patient;Family member/caregiver   Family Member Consulted wife, son  G-Codes - 07/30/14 1400    Functional Assessment Tool Used ambulates 50' with RW, AFO & assistive strap with minA.   Functional Limitation Mobility: Walking and moving around   Mobility: Walking and Moving Around Goal Status [phone]) At least 60 percent but less than 80 percent impaired, limited or restricted   Mobility: Walking and Moving Around Discharge Status [phone]) At least 60 percent but less than 80 percent impaired, limited or restricted      Problem List Patient Active Problem List   Diagnosis Date Noted  . DVT, bilateral lower limbs 09/21/2011  . 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 Dementia 09/16/2011    Jamey Reas PT, DPT 07/31/2014, 10:56 AM  [facility] [address] [place], [place], [address] Phone: [phone]   Fax:  [phone]

## 2014-07-31 NOTE — Therapy (Signed)
[facility] [address] [place], [place], [address] Phone: [phone]   Fax:  [phone]  Patient Details  Name: Ricky Rodriguez MRN: [id number] Date of Birth: 1940/09/05 Referring Provider:  No ref. provider found  Encounter Date: 07/31/2014  PHYSICAL THERAPY DISCHARGE SUMMARY  Visits from Start of Care: 14  Current functional level related to goals / functional outcomes:o      PT Long Term Goals - 07/30/14 1400    PT LONG TERM GOAL #1   Title wife or son verbalize understanding of updated HEP (Target Date: 07/31/2014)   Baseline MET 07/30/2014   Time 8   Period Weeks   Status Achieved   PT LONG TERM GOAL #2   Title tolerates wear of shoe that fits AFO >80% of awake hours and family donnes AFO into shoe correctly. (Target Date: 07/31/2014)   Baseline Partially MET 07/30/2014 Patient does not wear AFO during day but is willing to put it on leg prior to any standing / gait activities. Family report able to easily donne.   Time 8   Period Weeks   Status Partially Met   PT LONG TERM GOAL #3   Title ambulates 32' around furniture with [place] with family assist safely. (Target Date: 07/31/2014)   Baseline MET 07/30/2014   Time 8   Period Weeks   Status Achieved   PT LONG TERM GOAL #4   Title negotiates 2 single step (7") and ramps with RW and AFO with family assist safely. (Target Date: 07/31/2014)   Baseline MET 07/30/2014   Time 8   Period Weeks   Status Achieved       Remaining deficits: Limited mobility requiring assistance for all standing & gait. ROM & strength limitations long standing with orthopedic issues.   Education / Equipment: New ultralight Class 5 w/c recommend with pressure relieving cushion. HEP. AFO & assistance strap for LLE.  Plan: Patient agrees to discharge.  Patient goals were met. Patient is being discharged due to meeting the stated rehab goals.  ?????        Ogechi Kuehnel PT,  DPT 07/31/2014, 10:59 AM  [facility] [address] [place], [place], [address] Phone: [phone]   Fax:  [phone]

## 2014-08-17 ENCOUNTER — Encounter (HOSPITAL_BASED_OUTPATIENT_CLINIC_OR_DEPARTMENT_OTHER): Payer: Medicare Other | Attending: Plastic Surgery

## 2014-10-12 ENCOUNTER — Ambulatory Visit (INDEPENDENT_AMBULATORY_CARE_PROVIDER_SITE_OTHER): Payer: Managed Care, Other (non HMO) | Admitting: Internal Medicine

## 2014-10-12 ENCOUNTER — Encounter: Payer: Self-pay | Admitting: Internal Medicine

## 2014-10-12 VITALS — BP 130/80 | HR 97 | Temp 97.8°F | Wt 178.0 lb

## 2014-10-12 DIAGNOSIS — Z79899 Other long term (current) drug therapy: Secondary | ICD-10-CM | POA: Diagnosis not present

## 2014-10-12 DIAGNOSIS — T8452XA Infection and inflammatory reaction due to internal left hip prosthesis, initial encounter: Secondary | ICD-10-CM

## 2014-10-12 DIAGNOSIS — F0391 Unspecified dementia with behavioral disturbance: Secondary | ICD-10-CM

## 2014-10-12 LAB — COMPREHENSIVE METABOLIC PANEL
ALBUMIN: 4.1 g/dL (ref 3.6–5.1)
ALT: 16 U/L (ref 9–46)
AST: 17 U/L (ref 10–35)
Alkaline Phosphatase: 132 U/L — ABNORMAL HIGH (ref 40–115)
BILIRUBIN TOTAL: 0.8 mg/dL (ref 0.2–1.2)
BUN: 19 mg/dL (ref 7–25)
CO2: 21 mmol/L (ref 20–31)
CREATININE: 1 mg/dL (ref 0.70–1.18)
Calcium: 9.2 mg/dL (ref 8.6–10.3)
Chloride: 101 mmol/L (ref 98–110)
GLUCOSE: 114 mg/dL — AB (ref 65–99)
Potassium: 3.8 mmol/L (ref 3.5–5.3)
SODIUM: 135 mmol/L (ref 135–146)
Total Protein: 6.8 g/dL (ref 6.1–8.1)

## 2014-10-12 LAB — LIPID PANEL
Cholesterol: 196 mg/dL (ref 125–200)
HDL: 43 mg/dL (ref >=40)
LDL CALC: 138 mg/dL — AB (ref <130)
TRIGLYCERIDES: 77 mg/dL (ref <150)
Total CHOL/HDL Ratio: 4.6 Ratio (ref <=5.0)
VLDL: 15 mg/dL (ref <30)

## 2014-10-12 NOTE — Assessment & Plan Note (Signed)
Jevin will continue on chronic suppressive cephalexin therapy and follow-up with me annually.

## 2014-10-12 NOTE — Addendum Note (Signed)
Addended by: Lurlean Leyden on: 10/12/2014 03:09 PM   Modules accepted: Orders, Medications

## 2014-10-12 NOTE — Progress Notes (Signed)
Patient ID: Ricky Rodriguez, male   DOB: 09/10/1940, 74 y.o.   MRN: [id number]         [facility] for Infectious Disease  Patient Active Problem List   Diagnosis Date Noted  . DVT, bilateral lower limbs 09/21/2011  . 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 Dementia 09/16/2011    Patient's Medications  New Prescriptions   No medications on file  Previous Medications   ARIPIPRAZOLE (ABILIFY) 2 MG TABLET    Take 5 mg by mouth daily with breakfast. Patient uses 3 milligrams of this medication.   ASPIRIN 325 MG TABLET    Take 325 mg by mouth at bedtime.   CEPHALEXIN (KEFLEX) 500 MG CAPSULE    take 1 capsule by mouth three times a day   HYDROCHLOROTHIAZIDE (HYDRODIURIL) 50 MG TABLET    Take 50 mg by mouth daily.   IBUPROFEN (ADVIL,MOTRIN) 200 MG TABLET    Take 200 mg by mouth every 6 (six) hours as needed.   LANSOPRAZOLE (PREVACID) 30 MG CAPSULE    Take 30 mg by mouth every morning.    MULTIVITAMIN (ONE-A-DAY MEN'S) TABS TABLET    Take 1 tablet by mouth daily.   POTASSIUM CHLORIDE (KLOR-CON) 8 MEQ TABLET    Take 8 mEq by mouth daily.  Modified Medications   No medications on file  Discontinued Medications   No medications on file    Subjective: Ricky Rodriguez is in with his son for his routine annual visit. 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 His last surgery was a little over 3 years ago. He has had no signs of relapse of his infection and he continues to tolerate cephalexin well. Specifically he has not had any problems with diarrhea.  His son states that he recently had a brief episode of babbling speech. He says it seemed like his dad knew what he wanted to say but the words did not make sense. I chose to give him 2 aspirin and his speech promptly returned to normal. His son is concerned that he may have had a mini stroke.  Review  of Systems: Pertinent items are noted in HPI.  Past Medical History  Diagnosis Date  . Dementia   . History of blood clots   . Acid reflux   . A-fib   . Shortness of breath     Social History  Substance Use Topics  . Smoking status: Never Smoker   . Smokeless tobacco: Never Used  . Alcohol Use: 0.6 oz/week    1 Glasses of wine per week     Comment: occasional    No family history on file.  No Known Allergies  Objective: Filed Vitals:   10/12/14 1437  BP: 130/80  Pulse: 97  Temp: 97.8 F (36.6 C)  TempSrc: Oral  Weight: 178 lb (80.74 kg)   Body mass index is 24.14 kg/(m^2).  General: Ricky Rodriguez is seated in his wheelchair. He is in good spirits as usual Left Hip: No inflammation present    Problem List Items Addressed This Visit      Unprioritized   Dementia (Chronic)    Ricky Rodriguez has baseline dementia. I'm not sure what caused his transient speech abnormality last week. I encouraged his son to contact Ricky Rodriguez's primary care provider to inform him about what happened.      Relevant Orders   Comprehensive metabolic panel  Lipid panel   Prosthetic joint infection of left hip    Ricky Rodriguez will continue on chronic suppressive cephalexin therapy and follow-up with me annually.       Other Visit Diagnoses    Encounter for long-term (current) use of medications    -  Primary    Relevant Orders    Lipid panel        Cliffton Asters, MD [facility] for Infectious Disease [facility] [phone] pager   [phone] cell 10/12/2014, 3:06 PM

## 2014-10-12 NOTE — Assessment & Plan Note (Signed)
Ricky Rodriguez has baseline dementia. I'm not sure what caused his transient speech abnormality last week. I encouraged his son to contact Ricky Rodriguez's primary care provider to inform him about what happened.

## 2014-11-03 ENCOUNTER — Inpatient Hospital Stay (HOSPITAL_COMMUNITY)
Admission: EM | Admit: 2014-11-03 | Discharge: 2014-11-11 | DRG: 871 | Disposition: A | Discharge status: Skilled Nursing Facility | Payer: Managed Care, Other (non HMO) | Attending: Internal Medicine | Admitting: Internal Medicine

## 2014-11-03 ENCOUNTER — Emergency Department (HOSPITAL_COMMUNITY): Payer: Managed Care, Other (non HMO)

## 2014-11-03 ENCOUNTER — Encounter (HOSPITAL_COMMUNITY): Payer: Self-pay | Admitting: *Deleted

## 2014-11-03 DIAGNOSIS — E876 Hypokalemia: Secondary | ICD-10-CM | POA: Diagnosis present

## 2014-11-03 DIAGNOSIS — I248 Other forms of acute ischemic heart disease: Secondary | ICD-10-CM | POA: Diagnosis present

## 2014-11-03 DIAGNOSIS — Z79899 Other long term (current) drug therapy: Secondary | ICD-10-CM | POA: Diagnosis not present

## 2014-11-03 DIAGNOSIS — Z96642 Presence of left artificial hip joint: Secondary | ICD-10-CM | POA: Diagnosis present

## 2014-11-03 DIAGNOSIS — J690 Pneumonitis due to inhalation of food and vomit: Secondary | ICD-10-CM | POA: Diagnosis not present

## 2014-11-03 DIAGNOSIS — N179 Acute kidney failure, unspecified: Secondary | ICD-10-CM | POA: Diagnosis not present

## 2014-11-03 DIAGNOSIS — Z66 Do not resuscitate: Secondary | ICD-10-CM | POA: Diagnosis present

## 2014-11-03 DIAGNOSIS — K219 Gastro-esophageal reflux disease without esophagitis: Secondary | ICD-10-CM | POA: Diagnosis present

## 2014-11-03 DIAGNOSIS — R569 Unspecified convulsions: Secondary | ICD-10-CM | POA: Diagnosis present

## 2014-11-03 DIAGNOSIS — I4891 Unspecified atrial fibrillation: Secondary | ICD-10-CM | POA: Diagnosis present

## 2014-11-03 DIAGNOSIS — R7989 Other specified abnormal findings of blood chemistry: Secondary | ICD-10-CM | POA: Diagnosis not present

## 2014-11-03 DIAGNOSIS — A419 Sepsis, unspecified organism: Principal | ICD-10-CM | POA: Diagnosis present

## 2014-11-03 DIAGNOSIS — Z7982 Long term (current) use of aspirin: Secondary | ICD-10-CM

## 2014-11-03 DIAGNOSIS — F329 Major depressive disorder, single episode, unspecified: Secondary | ICD-10-CM | POA: Diagnosis present

## 2014-11-03 DIAGNOSIS — R509 Fever, unspecified: Secondary | ICD-10-CM | POA: Diagnosis present

## 2014-11-03 DIAGNOSIS — F0390 Unspecified dementia without behavioral disturbance: Secondary | ICD-10-CM | POA: Diagnosis present

## 2014-11-03 DIAGNOSIS — T502X5A Adverse effect of carbonic-anhydrase inhibitors, benzothiadiazides and other diuretics, initial encounter: Secondary | ICD-10-CM | POA: Diagnosis present

## 2014-11-03 DIAGNOSIS — R0600 Dyspnea, unspecified: Secondary | ICD-10-CM | POA: Diagnosis not present

## 2014-11-03 DIAGNOSIS — Z792 Long term (current) use of antibiotics: Secondary | ICD-10-CM

## 2014-11-03 DIAGNOSIS — R778 Other specified abnormalities of plasma proteins: Secondary | ICD-10-CM | POA: Diagnosis present

## 2014-11-03 DIAGNOSIS — Z515 Encounter for palliative care: Secondary | ICD-10-CM | POA: Diagnosis not present

## 2014-11-03 DIAGNOSIS — J189 Pneumonia, unspecified organism: Secondary | ICD-10-CM | POA: Diagnosis present

## 2014-11-03 DIAGNOSIS — R05 Cough: Secondary | ICD-10-CM

## 2014-11-03 DIAGNOSIS — R4182 Altered mental status, unspecified: Secondary | ICD-10-CM

## 2014-11-03 DIAGNOSIS — Z7189 Other specified counseling: Secondary | ICD-10-CM | POA: Diagnosis not present

## 2014-11-03 DIAGNOSIS — R059 Cough, unspecified: Secondary | ICD-10-CM

## 2014-11-03 LAB — CBC WITH DIFFERENTIAL/PLATELET
BASOS ABS: 0 10*3/uL (ref 0.0–0.1)
BASOS PCT: 0 % (ref 0–1)
Eosinophils Absolute: 0 10*3/uL (ref 0.0–0.7)
Eosinophils Relative: 0 % (ref 0–5)
HEMATOCRIT: 48.7 % (ref 39.0–52.0)
HEMOGLOBIN: 16.2 g/dL (ref 13.0–17.0)
LYMPHS PCT: 4 % — AB (ref 12–46)
Lymphs Abs: 0.9 10*3/uL (ref 0.7–4.0)
MCH: 31.6 pg (ref 26.0–34.0)
MCHC: 33.3 g/dL (ref 30.0–36.0)
MCV: 94.9 fL (ref 78.0–100.0)
MONOS PCT: 10 % (ref 3–12)
Monocytes Absolute: 2 10*3/uL — ABNORMAL HIGH (ref 0.1–1.0)
NEUTROS ABS: 16.8 10*3/uL — AB (ref 1.7–7.7)
NEUTROS PCT: 86 % — AB (ref 43–77)
Platelets: 214 10*3/uL (ref 150–400)
RBC: 5.13 MIL/uL (ref 4.22–5.81)
RDW: 14.3 % (ref 11.5–15.5)
WBC: 19.6 10*3/uL — ABNORMAL HIGH (ref 4.0–10.5)

## 2014-11-03 LAB — COMPREHENSIVE METABOLIC PANEL
ALBUMIN: 3.5 g/dL (ref 3.5–5.0)
ALK PHOS: 97 U/L (ref 38–126)
ALT: 87 U/L — ABNORMAL HIGH (ref 17–63)
ANION GAP: 10 (ref 5–15)
AST: 101 U/L — ABNORMAL HIGH (ref 15–41)
BUN: 51 mg/dL — ABNORMAL HIGH (ref 6–20)
CALCIUM: 8.9 mg/dL (ref 8.9–10.3)
CO2: 23 mmol/L (ref 22–32)
Chloride: 115 mmol/L — ABNORMAL HIGH (ref 101–111)
Creatinine, Ser: 1.38 mg/dL — ABNORMAL HIGH (ref 0.61–1.24)
GFR calc non Af Amer: 49 mL/min — ABNORMAL LOW (ref >60)
GFR, EST AFRICAN AMERICAN: 57 mL/min — AB (ref >60)
GLUCOSE: 158 mg/dL — AB (ref 65–99)
POTASSIUM: 3.5 mmol/L (ref 3.5–5.1)
SODIUM: 148 mmol/L — AB (ref 135–145)
Total Bilirubin: 1.1 mg/dL (ref 0.3–1.2)
Total Protein: 6.7 g/dL (ref 6.5–8.1)

## 2014-11-03 LAB — URINALYSIS, ROUTINE W REFLEX MICROSCOPIC
Bilirubin Urine: NEGATIVE
GLUCOSE, UA: NEGATIVE mg/dL
Ketones, ur: NEGATIVE mg/dL
Leukocytes, UA: NEGATIVE
Nitrite: NEGATIVE
Protein, ur: 30 mg/dL — AB
SPECIFIC GRAVITY, URINE: 1.029 (ref 1.005–1.030)
Urobilinogen, UA: 1 mg/dL (ref 0.0–1.0)
pH: 6 (ref 5.0–8.0)

## 2014-11-03 LAB — URINE MICROSCOPIC-ADD ON

## 2014-11-03 LAB — EXPECTORATED SPUTUM ASSESSMENT W GRAM STAIN, RFLX TO RESP C: Special Requests: NORMAL

## 2014-11-03 LAB — I-STAT CG4 LACTIC ACID, ED
LACTIC ACID, VENOUS: 1.18 mmol/L (ref 0.5–2.0)
Lactic Acid, Venous: 2.25 mmol/L (ref 0.5–2.0)

## 2014-11-03 LAB — GLUCOSE, CAPILLARY
GLUCOSE-CAPILLARY: 139 mg/dL — AB (ref 65–99)
Glucose-Capillary: 115 mg/dL — ABNORMAL HIGH (ref 65–99)

## 2014-11-03 LAB — TROPONIN I
TROPONIN I: 0.21 ng/mL — AB (ref <0.031)
Troponin I: 0.26 ng/mL — ABNORMAL HIGH (ref <0.031)

## 2014-11-03 LAB — STREP PNEUMONIAE URINARY ANTIGEN: STREP PNEUMO URINARY ANTIGEN: NEGATIVE

## 2014-11-03 MED ORDER — ACETAMINOPHEN 650 MG RE SUPP
650.0000 mg | Freq: Once | RECTAL | Status: AC
  Administered 2014-11-03: 650 mg via RECTAL
  Filled 2014-11-03: qty 1

## 2014-11-03 MED ORDER — PANTOPRAZOLE SODIUM 40 MG PO TBEC
40.0000 mg | DELAYED_RELEASE_TABLET | Freq: Every day | ORAL | Status: DC
  Administered 2014-11-03 – 2014-11-10 (×8): 40 mg via ORAL
  Filled 2014-11-03 (×9): qty 1

## 2014-11-03 MED ORDER — SODIUM CHLORIDE 0.9 % IV BOLUS (SEPSIS)
1000.0000 mL | Freq: Once | INTRAVENOUS | Status: AC
Start: 2014-11-03 — End: 2014-11-03
  Administered 2014-11-03: 1000 mL via INTRAVENOUS

## 2014-11-03 MED ORDER — POTASSIUM CHLORIDE ER 10 MEQ PO TBCR
10.0000 meq | EXTENDED_RELEASE_TABLET | Freq: Every day | ORAL | Status: DC
  Administered 2014-11-03 – 2014-11-04 (×2): 10 meq via ORAL
  Filled 2014-11-03 (×3): qty 1

## 2014-11-03 MED ORDER — HYDROCHLOROTHIAZIDE 50 MG PO TABS
50.0000 mg | ORAL_TABLET | Freq: Every day | ORAL | Status: DC
  Administered 2014-11-03 – 2014-11-04 (×2): 50 mg via ORAL
  Filled 2014-11-03 (×3): qty 1

## 2014-11-03 MED ORDER — SODIUM CHLORIDE 0.9 % IV SOLN
INTRAVENOUS | Status: DC
  Administered 2014-11-03 – 2014-11-04 (×3): via INTRAVENOUS

## 2014-11-03 MED ORDER — SODIUM CHLORIDE 0.9 % IV SOLN
1000.0000 mL | INTRAVENOUS | Status: DC
  Administered 2014-11-03: 1000 mL via INTRAVENOUS

## 2014-11-03 MED ORDER — DEXTROSE 5 % IV SOLN
1.0000 g | INTRAVENOUS | Status: DC
  Administered 2014-11-03 – 2014-11-07 (×5): 1 g via INTRAVENOUS
  Filled 2014-11-03 (×6): qty 10

## 2014-11-03 MED ORDER — INSULIN ASPART 100 UNIT/ML ~~LOC~~ SOLN
0.0000 [IU] | Freq: Three times a day (TID) | SUBCUTANEOUS | Status: DC
  Administered 2014-11-04 – 2014-11-07 (×3): 1 [IU] via SUBCUTANEOUS

## 2014-11-03 MED ORDER — SODIUM CHLORIDE 0.9 % IV SOLN
1000.0000 mL | Freq: Once | INTRAVENOUS | Status: AC
  Administered 2014-11-03: 1000 mL via INTRAVENOUS

## 2014-11-03 MED ORDER — PIPERACILLIN-TAZOBACTAM 3.375 G IVPB 30 MIN
3.3750 g | Freq: Once | INTRAVENOUS | Status: AC
  Administered 2014-11-03: 3.375 g via INTRAVENOUS
  Filled 2014-11-03: qty 50

## 2014-11-03 MED ORDER — DEXTROSE 5 % IV SOLN
500.0000 mg | INTRAVENOUS | Status: DC
  Administered 2014-11-03 – 2014-11-07 (×5): 500 mg via INTRAVENOUS
  Filled 2014-11-03 (×6): qty 500

## 2014-11-03 MED ORDER — ENOXAPARIN SODIUM 40 MG/0.4ML ~~LOC~~ SOLN
40.0000 mg | SUBCUTANEOUS | Status: DC
  Administered 2014-11-03 – 2014-11-10 (×8): 40 mg via SUBCUTANEOUS
  Filled 2014-11-03 (×9): qty 0.4

## 2014-11-03 MED ORDER — VANCOMYCIN HCL 10 G IV SOLR
1250.0000 mg | Freq: Once | INTRAVENOUS | Status: AC
  Administered 2014-11-03: 1250 mg via INTRAVENOUS
  Filled 2014-11-03: qty 1250

## 2014-11-03 MED ORDER — ARIPIPRAZOLE 5 MG PO TABS
5.0000 mg | ORAL_TABLET | Freq: Every day | ORAL | Status: DC
  Administered 2014-11-04 – 2014-11-10 (×7): 5 mg via ORAL
  Filled 2014-11-03 (×9): qty 1

## 2014-11-03 MED ORDER — ASPIRIN EC 81 MG PO TBEC
81.0000 mg | DELAYED_RELEASE_TABLET | Freq: Every day | ORAL | Status: DC
  Administered 2014-11-03 – 2014-11-10 (×8): 81 mg via ORAL
  Filled 2014-11-03 (×9): qty 1

## 2014-11-03 NOTE — Progress Notes (Signed)
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 Small abrasions to R wrist. Heels blanchable, Elevated off bed, Charge nurse Allyson assessed skin with primary RN.  IV:R A/C NS ; L A/C NSL Pain: Denies Tubes: N/A Safety Measures: Bed in lowest position, call light with in reach, yellow socks and fall risk band placed on pt.  Fall Prevention Safety Plan: Reviewed with pt's wife Admission Screening: Completed 6700 Orientation: Patient has been oriented to the unit, staff and to the room.  Orders have been reviewed and implemented. Will continue to assess and monitor pt.   Jonell Cluck, RN

## 2014-11-03 NOTE — ED Notes (Signed)
Pt in from home via [facility], pt LSN unknown, pt noted to have R sided neglect & R sided facial droop, pt follows some commands, pt will speak some words, pt reported to be warm to touch with brown urine, pts baseline A&O x4, pt is reported to be bed ridden

## 2014-11-03 NOTE — ED Notes (Signed)
Attempted report 

## 2014-11-03 NOTE — H&P (Signed)
History and Physical  SUEDE GREENAWALT JXB:[id number] DOB: Jan 13, 1941 DOA: 11/03/2014  Referring physician: Dr. Patria Mane, ED physician PCP: Charolett Bumpers, MD   Chief Complaint: Fever  HPI: Ricky Rodriguez is a 74 y.o. 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 Patient was brought to the hospital via EMS as his wife noted right-sided facial droop and difficulty ambulation. The emergency department, the patient did not demonstrate any focal weakness, but was found to have a fever. Due to the patient's dementia, and history is obtained by his wife as well as from the chart. His wife notes that he has not been following commands this morning and has not been able to walk with assistance, which he normally does. There are no palliating or provoking factors. His symptoms are slightly improved per his wife. Additionally, she notes slight cough, which she attributes to him clearing his throat. She denies decreased appetite, fevers, chills, nausea, difficulty breathing.   Review of Systems:  Unable to obtain due to dementia  Past Medical History  Diagnosis Date  . Dementia   . History of blood clots   . Acid reflux   . A-fib   . Shortness of breath    Past Surgical History  Procedure Laterality Date  . Hip surgery  05/22/11 last    L hip total replacement x 4   . Knee surgery    . Tonsillectomy    . Appendectomy    . Hernia repair    . Wrist surgery    . Incision and drainage hip  09/20/2011    Procedure: IRRIGATION AND DEBRIDEMENT HIP WITH POLY EXCHANGE;  Surgeon: Eldred Manges, MD;  Location: [facility] OR;  Service: Orthopedics;  Laterality: Left;   Social History:  reports that he has never smoked. He has never used smokeless tobacco. He reports that he drinks about 0.6 oz of alcohol per week. He reports that he does not use illicit drugs. Patient lives at home & is able to participate in  activities of daily living  No Known Allergies  Patient not able to relay family history  Prior to Admission medications   Medication Sig Start Date End Date Taking? Authorizing Provider  ARIPiprazole (ABILIFY) 2 MG tablet Take 5 mg by mouth daily with breakfast. Patient uses 3 milligrams of this medication.    Historical Provider, MD  aspirin EC 81 MG tablet Take 81 mg by mouth daily.    Historical Provider, MD  cephALEXin (KEFLEX) 500 MG capsule take 1 capsule by mouth three times a day Patient taking differently: take 1 capsule by mouth two times a day 04/28/14   Cliffton Asters, MD  hydrochlorothiazide (HYDRODIURIL) 50 MG tablet Take 50 mg by mouth daily.    Historical Provider, MD  ibuprofen (ADVIL,MOTRIN) 200 MG tablet Take 200 mg by mouth every 6 (six) hours as needed.    Historical Provider, MD  lansoprazole (PREVACID) 30 MG capsule Take 30 mg by mouth every morning.     Historical Provider, MD  multivitamin (ONE-A-DAY MEN'S) TABS tablet Take 1 tablet by mouth daily.    Historical Provider, MD  potassium chloride (KLOR-CON) 8 MEQ tablet Take 8 mEq by mouth daily.    Historical Provider, MD    Physical Exam: BP 133/88 mmHg  Pulse 81  Temp(Src) 99.7 F (37.6 C) (Rectal)  Resp 23  Ht  (1.778 m)  Wt 77.111 kg (  170 lb)  BMI 24.39 kg/m2  SpO2 97%  General: Elderly Caucasian male. Awake and alert.  . No acute cardiopulmonary distress.  Eyes: Pupils equal, round, reactive to light. Extraocular muscles are intact. Sclerae anicteric and noninjected.  ENT:  Dry mucosal membranes. No mucosal lesions.   Neck: Neck supple without lymphadenopathy. No carotid bruits. No masses palpated.  Cardiovascular: Regular rate with normal S1-S2 sounds. No murmurs, rubs, gallops auscultated. No JVD.  Respiratory: Diminished breath sounds on the left base with slight rales  Abdomen: Soft, nontender, nondistended. Active bowel sounds. No masses or hepatosplenomegaly  Skin: Dry, warm to touch. 2+  dorsalis pedis and radial pulses. Musculoskeletal: No calf or leg pain. All major joints not erythematous nontender.  Psychiatric: Intact judgment and insight.  Neurologic: No focal neurological deficits. Cranial nerves II through XII are grossly intact.           Labs on Admission:  Basic Metabolic Panel:  Recent Labs Lab 11/03/14 1050  NA 148*  K 3.5  CL 115*  CO2 23  GLUCOSE 158*  BUN 51*  CREATININE 1.38*  CALCIUM 8.9   Liver Function Tests:  Recent Labs Lab 11/03/14 1050  AST 101*  ALT 87*  ALKPHOS 97  BILITOT 1.1  PROT 6.7  ALBUMIN 3.5   No results for input(s): LIPASE, AMYLASE in the last 168 hours. No results for input(s): AMMONIA in the last 168 hours. CBC:  Recent Labs Lab 11/03/14 1050  WBC 19.6*  NEUTROABS 16.8*  HGB 16.2  HCT 48.7  MCV 94.9  PLT 214   Cardiac Enzymes:  Recent Labs Lab 11/03/14 1050  TROPONINI 0.26*    BNP (last 3 results) No results for input(s): BNP in the last 8760 hours.  ProBNP (last 3 results) No results for input(s): PROBNP in the last 8760 hours.  CBG: No results for input(s): GLUCAP in the last 168 hours.  Radiological Exams on Admission: Dg Chest 2 View  11/03/2014   CLINICAL DATA:  Cough, fever and lethargy.  EXAM: CHEST  2 VIEW  COMPARISON:  05/27/2014  FINDINGS: Cardiomediastinal silhouette is enlarged. Mediastinal contours appear intact.  There is no evidence of pleural effusion or pneumothorax. Lung volumes low. There is patchy airspace consolidation of the left lower lobe. Streaky airspace opacities are seen within the right infrahilar region.  Osseous structures are without acute abnormality. Soft tissues are grossly normal.  IMPRESSION: Enlarged cardiac silhouette.  Left lower lobe patchy airspace consolidation.  Right middle/ lower lobe streaky opacities. These may represent areas of scarring, however underlying pulmonary nodules cannot be excluded. CT of the chest may be considered if found clinically  necessary.   Electronically Signed   By: Ted Mcalpine M.D.   On: 11/03/2014 12:18   Ct Head Wo Contrast  11/03/2014   CLINICAL DATA:  Altered mental status  EXAM: CT HEAD WITHOUT CONTRAST  TECHNIQUE: Contiguous axial images were obtained from the base of the skull through the vertex without intravenous contrast.  COMPARISON:  09/24/2011  FINDINGS: No skull fracture is noted. Paranasal sinuses and mastoid air cells are unremarkable. No intracranial hemorrhage, mass effect or midline shift. Stable atrophy and chronic white matter small vessel disease. No acute cortical infarction. No mass lesion is noted on this unenhanced scan. Bilateral basal ganglia punctate calcifications are again noted.  IMPRESSION: No acute intracranial abnormality. Stable atrophy and chronic white matter disease.   Electronically Signed   By: Natasha Mead M.D.   On: 11/03/2014 12:26  EKG: Independently reviewed.  Sinus tachycardia. Rate of 114. Normal intervals. Old anterior infarct. Left axis deviation. PVCs seen. No significant change from prior. Negative for STEMI  Assessment/Plan Present on Admission:  . Fever . CAP (community acquired pneumonia) . Elevated troponin . Acute renal injury  This patient was discussed with the ED physician, including pertinent vitals, physical exam findings, labs, and imaging.  We also discussed care given by the ED provider.  #1 fever #2 CAP  Admit to telemetry  Azithromycin and ceftriaxone  Check CBC in the morning  Check strep antigen and Legionella antigen  Blood cultures obtained in the ER  Will collect sputum cultures #3 elevated troponin  Repeat troponin #4 acute renal injury  We'll give IV fluids and recheck creatinine in the morning #5 dementia   DVT prophylaxis:  Lovenox  Consultants:  None  Code Status:  DO NOT RESUSCITATE  Family Communication:  Spoke with wife who is the power of attorney for the patient. Her phone numbers in the demographics    Disposition Plan:  Admit   Levie Heritage, DO [facility] Pager [phone]

## 2014-11-03 NOTE — ED Provider Notes (Signed)
CSN: [id number]     Arrival date & time 11/03/14  1024 History   First MD Initiated Contact with Patient 11/03/14 1024     Chief Complaint  Patient presents with  . Altered Mental Status     Level V caveat: Dementia  HPI  Patient has a history of dementia and prior left septic arthritis which he is now on suppressive antibiotics from his infectious disease physician.  He presents emergency department with increasing generalized weakness.  His wife reports that he normally has some difficulty ambulating but today he was unable to ambulate secondary to severe weakness.  He's found have a fever on arrival to the emergency department.  She does believe that his been "clearing his throat" more frequently.  She admits that his respiratory rate seems higher than normal for him right now.  No history of recurrent urinary tract infections.  No recent complaints of vomiting or diarrhea.  Decreased oral intake over the past 24 hours.  Past Medical History  Diagnosis Date  . Dementia   . History of blood clots   . Acid reflux   . A-fib   . Shortness of breath    Past Surgical History  Procedure Laterality Date  . Hip surgery  05/22/11 last    L hip total replacement x 4   . Knee surgery    . Tonsillectomy    . Appendectomy    . Hernia repair    . Wrist surgery    . Incision and drainage hip  09/20/2011    Procedure: IRRIGATION AND DEBRIDEMENT HIP WITH POLY EXCHANGE;  Surgeon: Eldred Manges, MD;  Location: [facility] OR;  Service: Orthopedics;  Laterality: Left;   No family history on file. Social History  Substance Use Topics  . Smoking status: Never Smoker   . Smokeless tobacco: Never Used  . Alcohol Use: 0.6 oz/week    1 Glasses of wine per week     Comment: occasional    Review of Systems  Unable to perform ROS     Allergies  Review of patient's allergies indicates no known allergies.  Home Medications   Prior to Admission medications   Medication Sig Start Date End Date Taking?  Authorizing Provider  ARIPiprazole (ABILIFY) 2 MG tablet Take 5 mg by mouth daily with breakfast. Patient uses 3 milligrams of this medication.    Historical Provider, MD  aspirin EC 81 MG tablet Take 81 mg by mouth daily.    Historical Provider, MD  cephALEXin (KEFLEX) 500 MG capsule take 1 capsule by mouth three times a day Patient taking differently: take 1 capsule by mouth two times a day 04/28/14   Cliffton Asters, MD  hydrochlorothiazide (HYDRODIURIL) 50 MG tablet Take 50 mg by mouth daily.    Historical Provider, MD  ibuprofen (ADVIL,MOTRIN) 200 MG tablet Take 200 mg by mouth every 6 (six) hours as needed.    Historical Provider, MD  lansoprazole (PREVACID) 30 MG capsule Take 30 mg by mouth every morning.     Historical Provider, MD  multivitamin (ONE-A-DAY MEN'S) TABS tablet Take 1 tablet by mouth daily.    Historical Provider, MD  potassium chloride (KLOR-CON) 8 MEQ tablet Take 8 mEq by mouth daily.    Historical Provider, MD   BP 120/78 mmHg  Pulse 96  Temp(Src) 99.7 F (37.6 C) (Rectal)  Resp 21  Ht  (1.778 m)  Wt 170 lb (77.111 kg)  BMI 24.39 kg/m2  SpO2 93% Physical Exam  Constitutional:  Chronically ill-appearing  HENT:  Head: Normocephalic and atraumatic.  Eyes: EOM are normal.  Neck: Neck supple. No thyromegaly present.  Cardiovascular: Normal rate, regular rhythm and normal heart sounds.   Pulmonary/Chest:  Rhonchi in bases bilaterally.  Tachypnea.  Mild accessory muscle use.  Abdominal: Soft. He exhibits no distension. There is no tenderness.  Musculoskeletal: Normal range of motion.  Chronic contractures of his bilateral lower extremities.  Normal pulses distally.  Lymphadenopathy:    He has no cervical adenopathy.  Neurological: He is alert.  Localizes to pain  Skin: Skin is warm and dry.  Psychiatric: He has a normal mood and affect. Judgment normal.  Nursing note and vitals reviewed.   ED Course  Procedures (including critical care time) Labs  Review Labs Reviewed  COMPREHENSIVE METABOLIC PANEL - Abnormal; Notable for the following:    Sodium 148 (*)    Chloride 115 (*)    Glucose, Bld 158 (*)    BUN 51 (*)    Creatinine, Ser 1.38 (*)    AST 101 (*)    ALT 87 (*)    GFR calc non Af Amer 49 (*)    GFR calc Af Amer 57 (*)    All other components within normal limits  CBC WITH DIFFERENTIAL/PLATELET - Abnormal; Notable for the following:    WBC 19.6 (*)    Neutrophils Relative % 86 (*)    Neutro Abs 16.8 (*)    Lymphocytes Relative 4 (*)    Monocytes Absolute 2.0 (*)    All other components within normal limits  URINALYSIS, ROUTINE W REFLEX MICROSCOPIC (NOT AT [facility]) - Abnormal; Notable for the following:    Hgb urine dipstick TRACE (*)    Protein, ur 30 (*)    All other components within normal limits  TROPONIN I - Abnormal; Notable for the following:    Troponin I 0.26 (*)    All other components within normal limits  URINE MICROSCOPIC-ADD ON - Abnormal; Notable for the following:    Casts GRANULAR CAST (*)    All other components within normal limits  I-STAT CG4 LACTIC ACID, ED - Abnormal; Notable for the following:    Lactic Acid, Venous 2.25 (*)    All other components within normal limits  CULTURE, BLOOD (ROUTINE X 2)  CULTURE, BLOOD (ROUTINE X 2)  URINE CULTURE    Imaging Review Dg Chest 2 View  11/03/2014   CLINICAL DATA:  Cough, fever and lethargy.  EXAM: CHEST  2 VIEW  COMPARISON:  05/27/2014  FINDINGS: Cardiomediastinal silhouette is enlarged. Mediastinal contours appear intact.  There is no evidence of pleural effusion or pneumothorax. Lung volumes low. There is patchy airspace consolidation of the left lower lobe. Streaky airspace opacities are seen within the right infrahilar region.  Osseous structures are without acute abnormality. Soft tissues are grossly normal.  IMPRESSION: Enlarged cardiac silhouette.  Left lower lobe patchy airspace consolidation.  Right middle/ lower lobe streaky opacities. These may  represent areas of scarring, however underlying pulmonary nodules cannot be excluded. CT of the chest may be considered if found clinically necessary.   Electronically Signed   By: Ted Mcalpine M.D.   On: 11/03/2014 12:18   Ct Head Wo Contrast  11/03/2014   CLINICAL DATA:  Altered mental status  EXAM: CT HEAD WITHOUT CONTRAST  TECHNIQUE: Contiguous axial images were obtained from the base of the skull through the vertex without intravenous contrast.  COMPARISON:  09/24/2011  FINDINGS: No skull fracture is noted.  Paranasal sinuses and mastoid air cells are unremarkable. No intracranial hemorrhage, mass effect or midline shift. Stable atrophy and chronic white matter small vessel disease. No acute cortical infarction. No mass lesion is noted on this unenhanced scan. Bilateral basal ganglia punctate calcifications are again noted.  IMPRESSION: No acute intracranial abnormality. Stable atrophy and chronic white matter disease.   Electronically Signed   By: Natasha Mead M.D.   On: 11/03/2014 12:26   I have personally reviewed and evaluated these images and lab results as part of my medical decision-making.   EKG Interpretation   Date/Time:  Tuesday November 03 2014 10:29:22 EDT Ventricular Rate:  114 PR Interval:    QRS Duration: 84 QT Interval:  320 QTC Calculation: 441 R Axis:   -30 Text Interpretation:  sinus tachycardia Interpretation limited secondary  to artifact Left axis deviation Anterior infarct, old Borderline T  abnormalities, inferior leads Borderline ST elevation, lateral leads No  significant change was found from prior ecg Reconfirmed by Lydiann Bonifas  MD,  Caryn Bee ([id number]) on 11/03/2014 12:53:09 PM      MDM   Final diagnoses:  Fever, unspecified fever cause  Altered mental status, unspecified altered mental status type    Given the patient's congestion I suspect this is community acquired pneumonia.  Patient was given early antibiotics for sepsis given altered mental status and  fever on arrival.  He does have O2 sats of 92% and elevated respiratory rate.  No respiratory failure at this time.  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  No obvious cellulitis or skin breakdown.  Blood cultures pending.  Acute on chronic renal insufficiency.  Dehydration.  IV fluid boluses.  Admit to telemetry    Azalia Bilis, MD 11/04/14 1752

## 2014-11-04 ENCOUNTER — Inpatient Hospital Stay (HOSPITAL_COMMUNITY): Payer: Managed Care, Other (non HMO)

## 2014-11-04 DIAGNOSIS — R0600 Dyspnea, unspecified: Secondary | ICD-10-CM

## 2014-11-04 LAB — CBC
HEMATOCRIT: 44.1 % (ref 39.0–52.0)
HEMOGLOBIN: 14.3 g/dL (ref 13.0–17.0)
MCH: 30.8 pg (ref 26.0–34.0)
MCHC: 32.4 g/dL (ref 30.0–36.0)
MCV: 94.8 fL (ref 78.0–100.0)
Platelets: 180 10*3/uL (ref 150–400)
RBC: 4.65 MIL/uL (ref 4.22–5.81)
RDW: 14.4 % (ref 11.5–15.5)
WBC: 15.1 10*3/uL — AB (ref 4.0–10.5)

## 2014-11-04 LAB — TROPONIN I
TROPONIN I: 0.16 ng/mL — AB (ref <0.031)
TROPONIN I: 0.15 ng/mL — AB (ref <0.031)

## 2014-11-04 LAB — LEGIONELLA ANTIGEN, URINE

## 2014-11-04 LAB — BASIC METABOLIC PANEL
ANION GAP: 10 (ref 5–15)
BUN: 31 mg/dL — ABNORMAL HIGH (ref 6–20)
CHLORIDE: 113 mmol/L — AB (ref 101–111)
CO2: 23 mmol/L (ref 22–32)
Calcium: 8 mg/dL — ABNORMAL LOW (ref 8.9–10.3)
Creatinine, Ser: 1.21 mg/dL (ref 0.61–1.24)
GFR calc non Af Amer: 57 mL/min — ABNORMAL LOW (ref >60)
Glucose, Bld: 111 mg/dL — ABNORMAL HIGH (ref 65–99)
POTASSIUM: 2.8 mmol/L — AB (ref 3.5–5.1)
SODIUM: 146 mmol/L — AB (ref 135–145)

## 2014-11-04 LAB — GLUCOSE, CAPILLARY
GLUCOSE-CAPILLARY: 130 mg/dL — AB (ref 65–99)
GLUCOSE-CAPILLARY: 103 mg/dL — AB (ref 65–99)
Glucose-Capillary: 111 mg/dL — ABNORMAL HIGH (ref 65–99)
Glucose-Capillary: 100 mg/dL — ABNORMAL HIGH (ref 65–99)

## 2014-11-04 LAB — URINE CULTURE: Culture: NO GROWTH

## 2014-11-04 LAB — INFLUENZA PANEL BY PCR (TYPE A & B)
H1N1 flu by pcr: NOT DETECTED
Influenza A By PCR: NEGATIVE
Influenza B By PCR: NEGATIVE

## 2014-11-04 MED ORDER — ACETAMINOPHEN 325 MG PO TABS
650.0000 mg | ORAL_TABLET | Freq: Four times a day (QID) | ORAL | Status: DC | PRN
  Administered 2014-11-04 – 2014-11-06 (×4): 650 mg via ORAL
  Filled 2014-11-04 (×3): qty 2

## 2014-11-04 MED ORDER — POTASSIUM CHLORIDE 10 MEQ/100ML IV SOLN
10.0000 meq | INTRAVENOUS | Status: AC
  Administered 2014-11-04 (×4): 10 meq via INTRAVENOUS
  Filled 2014-11-04 (×4): qty 100

## 2014-11-04 MED ORDER — POTASSIUM CHLORIDE CRYS ER 20 MEQ PO TBCR
60.0000 meq | EXTENDED_RELEASE_TABLET | Freq: Every day | ORAL | Status: DC
  Administered 2014-11-04 – 2014-11-10 (×7): 60 meq via ORAL
  Filled 2014-11-04 (×4): qty 3
  Filled 2014-11-04: qty 6
  Filled 2014-11-04 (×4): qty 3

## 2014-11-04 MED ORDER — HYDROCHLOROTHIAZIDE 25 MG PO TABS: 25.0000 mg | ORAL_TABLET | Freq: Every day | ORAL | Status: DC

## 2014-11-04 NOTE — Plan of Care (Signed)
Problem: Phase I Progression Outcomes Goal: EF % per last Echo/documented,Core Reminder form on chart Outcome: Completed/Met Date Met:  11/04/14 50-55% EF

## 2014-11-04 NOTE — Progress Notes (Signed)
Nutrition Brief Note  Patient identified on the Malnutrition Screening Tool (MST) Report  Wt Readings from Last 15 Encounters:  11/03/14 169 lb 15.6 oz (77.1 kg)  10/12/14 178 lb (80.74 kg)  10/27/13 217 lb (98.431 kg)  04/29/13 234 lb (106.142 kg)  10/14/12 231 lb 4 oz (104.894 kg)  06/11/12 227 lb (102.967 kg)  04/09/12 228 lb 8 oz (103.647 kg)  02/08/12 225 lb (102.059 kg)  12/06/11 220 lb (99.791 kg)  10/24/11 198 lb (89.812 kg)  09/22/11 240 lb 4.8 oz (109 kg)  06/16/10 220 lb (99.791 kg)  05/12/10 211 lb (95.709 kg)  03/24/10 203 lb (92.08 kg)    Body mass index is 24.39 kg/(m^2). Patient meets criteria for normal based on current BMI. Wife at bedside, reports pt weight loss has been intentional.  Current diet order is carbohydrate modifed, patient is consuming approximately 90% of meals at this time. Pt has been eating well PTA, usually on a soft food diet. She reports no other needs. Labs and medications reviewed.   No nutrition interventions warranted at this time. If nutrition issues arise, please consult RD.   Roslyn Smiling, MS, RD, LDN Pager # [phone] After hours/ weekend pager # [phone]

## 2014-11-04 NOTE — Progress Notes (Signed)
[facility] PROGRESS NOTE  Ricky Rodriguez:[id number] DOB: 02-11-1941 DOA: 11/03/2014 PCP: Charolett Bumpers, MD  Assessment/Plan: 1. Community acquired vs aspiration pneumonia -improving -continue current abx, check swallow eval -Await blood cx  2. Dementia/Depression -stable, continue abilify  3. Elevated troponin -? Demand from fever/sepsis, no chest pain,EKG non specific -will check ECHO for wall motion -troponin's trending down  4. AKI -improving with hydration, continue IVf  5. 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 Debility/Chronic L leg issues -Pt eval  7. Hypokalemia -due to HCTZ, replace  DVT proph: lovenox  Code Status: DNR Family Communication: wife at bedside Disposition Plan: home when improved     HPI/Subjective: Feels ok, looks better per wife  Objective: Filed Vitals:   11/04/14 1144  BP:   Pulse:   Temp: 98.4 F (36.9 C)  Resp:     Intake/Output Summary (Last 24 hours) at 11/04/14 1429 Last data filed at 11/04/14 1035  Gross per 24 hour  Intake 4205.42 ml  Output   1450 ml  Net 2755.42 ml   Filed Weights   11/03/14 1034 11/03/14 2100  Weight: 77.111 kg (170 lb) 77.1 kg (169 lb 15.6 oz)    Exam:   General:  Alert, awake, slow to respond, oriented to self, place  Cardiovascular: S1S2/RRR  Respiratory: ronchi at bases  Abdomen: soft, NT, BS present  Musculoskeletal: no edema c/c  Data Reviewed: Basic Metabolic Panel:  Recent Labs Lab 11/03/14 1050 11/04/14 0530  NA 148* 146*  K 3.5 2.8*  CL 115* 113*  CO2 23 23  GLUCOSE 158* 111*  BUN 51* 31*  CREATININE 1.38* 1.21  CALCIUM 8.9 8.0*   Liver Function Tests:  Recent Labs Lab 11/03/14 1050  AST 101*  ALT 87*  ALKPHOS 97  BILITOT 1.1  PROT 6.7  ALBUMIN 3.5   No results for  input(s): LIPASE, AMYLASE in the last 168 hours. No results for input(s): AMMONIA in the last 168 hours. CBC:  Recent Labs Lab 11/03/14 1050 11/04/14 0530  WBC 19.6* 15.1*  NEUTROABS 16.8*  --   HGB 16.2 14.3  HCT 48.7 44.1  MCV 94.9 94.8  PLT 214 180   Cardiac Enzymes:  Recent Labs Lab 11/03/14 1050 11/03/14 1645 11/04/14 1010  TROPONINI 0.26* 0.21* 0.16*   BNP (last 3 results) No results for input(s): BNP in the last 8760 hours.  ProBNP (last 3 results) No results for input(s): PROBNP in the last 8760 hours.  CBG:  Recent Labs Lab 11/03/14 1702 11/03/14 2038 11/04/14 0733 11/04/14 1123  GLUCAP 115* 139* 111* 130*    Recent Results (from the past 240 hour(s))  Culture, blood (routine x 2)     Status: None (Preliminary result)   Collection Time: 11/03/14 10:20 AM  Result Value Ref Range Status   Specimen Description BLOOD RIGHT ANTECUBITAL  Final   Special Requests BOTTLES DRAWN AEROBIC AND ANAEROBIC 5CC  Final   Culture NO GROWTH 1 DAY  Final   Report Status PENDING  Incomplete  Culture, blood (routine x 2)     Status: None (Preliminary result)   Collection Time: 11/03/14 10:50 AM  Result Value Ref Range Status   Specimen Description BLOOD LEFT ANTECUBITAL  Final   Special Requests BOTTLES DRAWN AEROBIC AND ANAEROBIC 5CC  Final   Culture NO GROWTH 1 DAY  Final  Report Status PENDING  Incomplete  Urine culture     Status: None   Collection Time: 11/03/14 11:13 AM  Result Value Ref Range Status   Specimen Description URINE, CATHETERIZED  Final   Special Requests NONE  Final   Culture NO GROWTH 1 DAY  Final   Report Status 11/04/2014 FINAL  Final  Culture, sputum-assessment     Status: None   Collection Time: 11/03/14  5:24 PM  Result Value Ref Range Status   Specimen Description SPUTUM  Final   Special Requests Normal  Final   Sputum evaluation   Final    MICROSCOPIC FINDINGS SUGGEST THAT THIS SPECIMEN IS NOT REPRESENTATIVE OF LOWER RESPIRATORY  SECRETIONS. PLEASE RECOLLECT. NOTIFIED E CASTRO 1929 11/03/14 A BROWNING    Report Status 11/03/2014 FINAL  Final     Studies: Dg Chest 2 View  11/03/2014   CLINICAL DATA:  Cough, fever and lethargy.  EXAM: CHEST  2 VIEW  COMPARISON:  05/27/2014  FINDINGS: Cardiomediastinal silhouette is enlarged. Mediastinal contours appear intact.  There is no evidence of pleural effusion or pneumothorax. Lung volumes low. There is patchy airspace consolidation of the left lower lobe. Streaky airspace opacities are seen within the right infrahilar region.  Osseous structures are without acute abnormality. Soft tissues are grossly normal.  IMPRESSION: Enlarged cardiac silhouette.  Left lower lobe patchy airspace consolidation.  Right middle/ lower lobe streaky opacities. These may represent areas of scarring, however underlying pulmonary nodules cannot be excluded. CT of the chest may be considered if found clinically necessary.   Electronically Signed   By: Ted Mcalpine M.D.   On: 11/03/2014 12:18   Ct Head Wo Contrast  11/03/2014   CLINICAL DATA:  Altered mental status  EXAM: CT HEAD WITHOUT CONTRAST  TECHNIQUE: Contiguous axial images were obtained from the base of the skull through the vertex without intravenous contrast.  COMPARISON:  09/24/2011  FINDINGS: No skull fracture is noted. Paranasal sinuses and mastoid air cells are unremarkable. No intracranial hemorrhage, mass effect or midline shift. Stable atrophy and chronic white matter small vessel disease. No acute cortical infarction. No mass lesion is noted on this unenhanced scan. Bilateral basal ganglia punctate calcifications are again noted.  IMPRESSION: No acute intracranial abnormality. Stable atrophy and chronic white matter disease.   Electronically Signed   By: Natasha Mead M.D.   On: 11/03/2014 12:26    Scheduled Meds: . ARIPiprazole  5 mg Oral Q breakfast  . aspirin EC  81 mg Oral Daily  . azithromycin  500 mg Intravenous Q24H  . cefTRIAXone  (ROCEPHIN)  IV  1 g Intravenous Q24H  . enoxaparin (LOVENOX) injection  40 mg Subcutaneous Q24H  . [START ON 11/05/2014] hydrochlorothiazide  25 mg Oral Daily  . insulin aspart  0-9 Units Subcutaneous TID WC  . pantoprazole  40 mg Oral Daily  . potassium chloride SA  60 mEq Oral Daily   Continuous Infusions: . sodium chloride 75 mL/hr at 11/04/14 0858   Antibiotics Given (last 72 hours)    Date/Time Action Medication Dose Rate   11/03/14 1720 Given   cefTRIAXone (ROCEPHIN) 1 g in dextrose 5 % 50 mL IVPB 1 g 100 mL/hr   11/03/14 1834 Given   azithromycin (ZITHROMAX) 500 mg in dextrose 5 % 250 mL IVPB 500 mg 250 mL/hr      Active Problems:   Fever   CAP (community acquired pneumonia)   Elevated troponin   Acute renal injury    Time  spent:    [facility]  [facility] Pager [phone]. If 7PM-7AM, please contact night-coverage at [email or web address], password [facility] 11/04/2014, 2:29 PM  LOS: 1 day

## 2014-11-04 NOTE — Clinical Documentation Improvement (Signed)
Hospitalist  Abnormal Lab/Test Results:   Troponin I: 9/06:  0.26 0/06:  0.21 9/07:  0.16  Possible Clinical Conditions associated with below indicators  Myocardial Infarction  Other Condition  Cannot Clinically Determine   Supporting Information: Elevated troponin, Repeat troponin per 9/06 progress notes.   Please exercise your independent, professional judgment when responding. A specific answer is not anticipated or expected.   [facility] Mathews-Bethea,RN,BSN,Clinical Documentation Specialist [phone] [email or web address]  Thank You,  Rodman Pickle [facility] [place]

## 2014-11-04 NOTE — Progress Notes (Signed)
  Echocardiogram 2D Echocardiogram has been performed.  Ricky Rodriguez 11/04/2014, 4:07 PM

## 2014-11-05 LAB — GLUCOSE, CAPILLARY
GLUCOSE-CAPILLARY: 112 mg/dL — AB (ref 65–99)
GLUCOSE-CAPILLARY: 84 mg/dL (ref 65–99)
GLUCOSE-CAPILLARY: 112 mg/dL — AB (ref 65–99)
Glucose-Capillary: 106 mg/dL — ABNORMAL HIGH (ref 65–99)

## 2014-11-05 LAB — CBC
HEMATOCRIT: 42.7 % (ref 39.0–52.0)
Hemoglobin: 14 g/dL (ref 13.0–17.0)
MCH: 31 pg (ref 26.0–34.0)
MCHC: 32.8 g/dL (ref 30.0–36.0)
MCV: 94.5 fL (ref 78.0–100.0)
Platelets: 158 10*3/uL (ref 150–400)
RBC: 4.52 MIL/uL (ref 4.22–5.81)
RDW: 14.1 % (ref 11.5–15.5)
WBC: 16.1 10*3/uL — ABNORMAL HIGH (ref 4.0–10.5)

## 2014-11-05 LAB — COMPREHENSIVE METABOLIC PANEL
ALT: 62 U/L (ref 17–63)
ANION GAP: 11 (ref 5–15)
AST: 38 U/L (ref 15–41)
Albumin: 2.6 g/dL — ABNORMAL LOW (ref 3.5–5.0)
Alkaline Phosphatase: 69 U/L (ref 38–126)
BILIRUBIN TOTAL: 1 mg/dL (ref 0.3–1.2)
BUN: 22 mg/dL — ABNORMAL HIGH (ref 6–20)
CO2: 21 mmol/L — ABNORMAL LOW (ref 22–32)
Calcium: 7.6 mg/dL — ABNORMAL LOW (ref 8.9–10.3)
Chloride: 109 mmol/L (ref 101–111)
Creatinine, Ser: 1.05 mg/dL (ref 0.61–1.24)
Glucose, Bld: 113 mg/dL — ABNORMAL HIGH (ref 65–99)
POTASSIUM: 3.1 mmol/L — AB (ref 3.5–5.1)
Sodium: 141 mmol/L (ref 135–145)
TOTAL PROTEIN: 5.2 g/dL — AB (ref 6.5–8.1)

## 2014-11-05 LAB — HIV ANTIBODY (ROUTINE TESTING W REFLEX): HIV SCREEN 4TH GENERATION: NONREACTIVE

## 2014-11-05 MED ORDER — IBUPROFEN 400 MG PO TABS
400.0000 mg | ORAL_TABLET | Freq: Once | ORAL | Status: AC
  Administered 2014-11-05: 400 mg via ORAL
  Filled 2014-11-05: qty 1

## 2014-11-05 NOTE — Progress Notes (Signed)
[facility] PROGRESS NOTE  Ricky Rodriguez UJW:[id number] DOB: [birth year] DOA: 11/03/2014 PCP: Charolett Bumpers, MD  Assessment/Plan: 1. Community acquired vs aspiration pneumonia -improving, continue current abx,  -fever curve better -swallow eval today -blood cx-NGTD  2. Dementia/Depression -stable, continue abilify  3. Elevated troponin -? Demand from fever/sepsis, no chest pain,EKG non specific -ECHO poor quality but normal EF and no wall motion abnormalities -troponin's trending down  4. AKI -improving with hydration, continue IVf  5. 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 Debility/Chronic L leg issues -Pt eval pending  7. Hypokalemia -due to HCTZ, replaced  DVT proph: lovenox  Code Status: DNR Family Communication: wife at bedside Disposition Plan: home tomorrow if stable     HPI/Subjective: Feels ok, looks better per wife  Objective: Filed Vitals:   11/05/14 0916  BP: 124/56  Pulse: 90  Temp: 100.9 F (38.3 C)  Resp: 20    Intake/Output Summary (Last 24 hours) at 11/05/14 1134 Last data filed at 11/05/14 0600  Gross per 24 hour  Intake 2488.33 ml  Output    300 ml  Net 2188.33 ml   Filed Weights   11/03/14 1034 11/03/14 2100 11/04/14 2044  Weight: 77.111 kg (170 lb) 77.1 kg (169 lb 15.6 oz) 77.8 kg (171 lb 8.3 oz)    Exam:   General:  Alert, awake, slow to respond, oriented to self, place  Cardiovascular: S1S2/RRR  Respiratory: ronchi at bases  Abdomen: soft, NT, BS present  Musculoskeletal: no edema c/c  Data Reviewed: Basic Metabolic Panel:  Recent Labs Lab 11/03/14 1050 11/04/14 0530 11/05/14 0524  NA 148* 146* 141  K 3.5 2.8* 3.1*  CL 115* 113* 109  CO2 23 23 21*  GLUCOSE 158* 111* 113*  BUN 51*  31* 22*  CREATININE 1.38* 1.21 1.05  CALCIUM 8.9 8.0* 7.6*   Liver Function Tests:  Recent Labs Lab 11/03/14 1050 11/05/14 0524  AST 101* 38  ALT 87* 62  ALKPHOS 97 69  BILITOT 1.1 1.0  PROT 6.7 5.2*  ALBUMIN 3.5 2.6*   No results for input(s): LIPASE, AMYLASE in the last 168 hours. No results for input(s): AMMONIA in the last 168 hours. CBC:  Recent Labs Lab 11/03/14 1050 11/04/14 0530 11/05/14 0524  WBC 19.6* 15.1* 16.1*  NEUTROABS 16.8*  --   --   HGB 16.2 14.3 14.0  HCT 48.7 44.1 42.7  MCV 94.9 94.8 94.5  PLT 214 180 158   Cardiac Enzymes:  Recent Labs Lab 11/03/14 1050 11/03/14 1645 11/04/14 1010 11/04/14 1630  TROPONINI 0.26* 0.21* 0.16* 0.15*   BNP (last 3 results) No results for input(s): BNP in the last 8760 hours.  ProBNP (last 3 results) No results for input(s): PROBNP in the last 8760 hours.  CBG:  Recent Labs Lab 11/04/14 1123 11/04/14 1633 11/04/14 2041 11/05/14 0722 11/05/14 1106  GLUCAP 130* 100* 103* 112* 106*    Recent Results (from the past 240 hour(s))  Culture, blood (routine x 2)     Status: None (Preliminary result)   Collection Time: 11/03/14 10:20 AM  Result Value Ref Range Status   Specimen Description BLOOD RIGHT ANTECUBITAL  Final   Special Requests BOTTLES DRAWN AEROBIC AND ANAEROBIC 5CC  Final   Culture NO GROWTH 1 DAY  Final   Report  Status PENDING  Incomplete  Culture, blood (routine x 2)     Status: None (Preliminary result)   Collection Time: 11/03/14 10:50 AM  Result Value Ref Range Status   Specimen Description BLOOD LEFT ANTECUBITAL  Final   Special Requests BOTTLES DRAWN AEROBIC AND ANAEROBIC 5CC  Final   Culture NO GROWTH 1 DAY  Final   Report Status PENDING  Incomplete  Urine culture     Status: None   Collection Time: 11/03/14 11:13 AM  Result Value Ref Range Status   Specimen Description URINE, CATHETERIZED  Final   Special Requests NONE  Final   Culture NO GROWTH 1 DAY  Final   Report Status  11/04/2014 FINAL  Final  Culture, sputum-assessment     Status: None   Collection Time: 11/03/14  5:24 PM  Result Value Ref Range Status   Specimen Description SPUTUM  Final   Special Requests Normal  Final   Sputum evaluation   Final    MICROSCOPIC FINDINGS SUGGEST THAT THIS SPECIMEN IS NOT REPRESENTATIVE OF LOWER RESPIRATORY SECRETIONS. PLEASE RECOLLECT. NOTIFIED E CASTRO 1929 11/03/14 A BROWNING    Report Status 11/03/2014 FINAL  Final     Studies: Dg Chest 2 View  11/03/2014   CLINICAL DATA:  Cough, fever and lethargy.  EXAM: CHEST  2 VIEW  COMPARISON:  05/27/2014  FINDINGS: Cardiomediastinal silhouette is enlarged. Mediastinal contours appear intact.  There is no evidence of pleural effusion or pneumothorax. Lung volumes low. There is patchy airspace consolidation of the left lower lobe. Streaky airspace opacities are seen within the right infrahilar region.  Osseous structures are without acute abnormality. Soft tissues are grossly normal.  IMPRESSION: Enlarged cardiac silhouette.  Left lower lobe patchy airspace consolidation.  Right middle/ lower lobe streaky opacities. These may represent areas of scarring, however underlying pulmonary nodules cannot be excluded. CT of the chest may be considered if found clinically necessary.   Electronically Signed   By: Ted Mcalpine M.D.   On: 11/03/2014 12:18   Ct Head Wo Contrast  11/03/2014   CLINICAL DATA:  Altered mental status  EXAM: CT HEAD WITHOUT CONTRAST  TECHNIQUE: Contiguous axial images were obtained from the base of the skull through the vertex without intravenous contrast.  COMPARISON:  09/24/2011  FINDINGS: No skull fracture is noted. Paranasal sinuses and mastoid air cells are unremarkable. No intracranial hemorrhage, mass effect or midline shift. Stable atrophy and chronic white matter small vessel disease. No acute cortical infarction. No mass lesion is noted on this unenhanced scan. Bilateral basal ganglia punctate calcifications  are again noted.  IMPRESSION: No acute intracranial abnormality. Stable atrophy and chronic white matter disease.   Electronically Signed   By: Natasha Mead M.D.   On: 11/03/2014 12:26    Scheduled Meds: . ARIPiprazole  5 mg Oral Q breakfast  . aspirin EC  81 mg Oral Daily  . azithromycin  500 mg Intravenous Q24H  . cefTRIAXone (ROCEPHIN)  IV  1 g Intravenous Q24H  . enoxaparin (LOVENOX) injection  40 mg Subcutaneous Q24H  . insulin aspart  0-9 Units Subcutaneous TID WC  . pantoprazole  40 mg Oral Daily  . potassium chloride SA  60 mEq Oral Daily   Continuous Infusions: . sodium chloride 75 mL/hr at 11/04/14 2152   Antibiotics Given (last 72 hours)    Date/Time Action Medication Dose Rate   11/03/14 1720 Given   cefTRIAXone (ROCEPHIN) 1 g in dextrose 5 % 50 mL IVPB 1 g 100  mL/hr   11/03/14 1834 Given   azithromycin (ZITHROMAX) 500 mg in dextrose 5 % 250 mL IVPB 500 mg 250 mL/hr   11/04/14 1802 Given   cefTRIAXone (ROCEPHIN) 1 g in dextrose 5 % 50 mL IVPB 1 g 100 mL/hr   11/04/14 1846 Given   azithromycin (ZITHROMAX) 500 mg in dextrose 5 % 250 mL IVPB 500 mg 250 mL/hr      Active Problems:   Fever   CAP (community acquired pneumonia)   Elevated troponin   Acute renal injury    Time spent:    [facility]  [facility] Pager [phone]. If 7PM-7AM, please contact night-coverage at [email or web address], password [email or web address] 11/05/2014, 11:34 AM  LOS: 2 days

## 2014-11-05 NOTE — Evaluation (Signed)
Clinical/Bedside Swallow Evaluation Patient Details  Name: Ricky Rodriguez MRN: [id number] Date of Birth: October 11, 1940  Today's Date: 11/05/2014 Time: SLP Start Time (ACUTE ONLY): 1005 SLP Stop Time (ACUTE ONLY): 1026 SLP Time Calculation (min) (ACUTE ONLY): 21 min  Past Medical History:  Past Medical History  Diagnosis Date  . Dementia   . History of blood clots   . Acid reflux   . A-fib   . Shortness of breath    Past Surgical History:  Past Surgical History  Procedure Laterality Date  . Hip surgery  05/22/11 last    L hip total replacement x 4   . Knee surgery    . Tonsillectomy    . Appendectomy    . Hernia repair    . Wrist surgery    . 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 Patient was brought to the hospital via EMS as his wife noted right-sided facial droop and difficulty ambulation. The emergency department, the patient did not demonstrate any focal weakness, but was found to have a fever. Due to the patient's dementia, and history is obtained by his wife as well as from the chart. His wife notes that he has not been following commands this morning and has not been able to walk with assistance, which he normally does.  Chest x-ray revealed left lower lobe patchy airspace consolidation and right middle/ lower lobe streaky opacities, which may represent areas of scarring.   Assessment / Plan / Recommendation Clinical Impression  Bedside swallow evaluation completed.  Patient demonstrates a suspected cognitive based dysphagia.  Patient was impulsive and demonstrated poor awareness when self-feeding trials.  SLP facilitated session with constant cues for portion control and pacing which  effectively prevented patterns of overt s/s of aspiration.  Patient demonstrated intermittent cough x2 during trials.  Wife present and confirmed that at home patient is impulsive and chokes on foods about once a week.  SLP educated wife on options of downgrading diet in hopes of increasing safety or increasing level of assist with PO intake.  Spouse opted to provide constant supervision with PO to ensure strict use of aspiration precautions with least restrictive diet.  SLP recommended that spouse also education caregiver on recommendations.  Recommend brief acute SLP follow up as well as outpatient SLP follow up to address cognition given it's impact on patient safety with PO intake and an increased buren of care.      Aspiration Risk  Mild    Diet Recommendation Age appropriate regular solids;Thin   Medication Administration: Whole meds with puree Compensations: Slow rate;Small sips/bites    Other  Recommendations Oral Care Recommendations: Oral care BID   Follow Up Recommendations  Outpatient SLP    Frequency and Duration min 2x/week  1 week   Pertinent Vitals/Pain None    SLP Swallow Goals  see care plan    Swallow Study Prior Functional Status   regular and thin with choking about once a week due to large bites and sips per wife    General Other Pertinent Information: With a history of dementia, acid reflux, 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 Patient was brought  to the hospital via EMS as his wife noted right-sided facial droop and difficulty ambulation. The emergency department, the patient did not demonstrate any focal weakness, but was found to have a fever. Due to the patient's dementia, and history is obtained by his wife as well as from the chart. His wife notes that he has not been following commands this morning and has not been able to walk with assistance, which he normally does.  Chest x-ray revealed left  lower lobe patchy airspace consolidation and right middle/ lower lobe streaky opacities, which may represent areas of scarring. Type of Study: Bedside swallow evaluation Previous Swallow Assessment: none Diet Prior to this Study: Regular;Thin liquids Temperature Spikes Noted: Yes Respiratory Status: Room air History of Recent Intubation: No Behavior/Cognition: Alert;Cooperative;Pleasant mood;Requires cueing;Distractible Oral Cavity - Dentition: Adequate natural dentition/normal for age Self-Feeding Abilities: Able to feed self;Needs set up;Needs assist Patient Positioning: Upright in bed Baseline Vocal Quality: Normal Volitional Cough: Strong Volitional Swallow: Able to elicit    Oral/Motor/Sensory Function Overall Oral Motor/Sensory Function: Appears within functional limits for tasks assessed Labial ROM: Within Functional Limits Labial Symmetry: Within Functional Limits Labial Strength: Within Functional Limits Lingual ROM: Within Functional Limits Lingual Symmetry: Within Functional Limits Lingual Strength: Within Functional Limits Facial ROM: Within Functional Limits Facial Symmetry: Within Functional Limits Facial Strength: Within Functional Limits Velum: Within Functional Limits Mandible: Within Functional Limits   Ice Chips Ice chips: Not tested   Thin Liquid Thin Liquid: Impaired Presentation: Straw;Self Fed Pharyngeal  Phase Impairments: Multiple swallows Other Comments: patient was impulsive with PO intake and required constant cuing due to cognitive deficits    Nectar Thick Nectar Thick Liquid: Not tested   Honey Thick Honey Thick Liquid: Not tested   Puree Puree: Within functional limits Presentation: Self Fed;Spoon Other Comments: with cues for portion control and pacing   Solid   GO    Solid: Within functional limits Presentation: Self Fed Other Comments: with cues for portion control and pacing      Fae Pippin, M.A.,  CCC-SLP [phone]  Shera Laubach 11/05/2014,10:39 AM

## 2014-11-06 ENCOUNTER — Inpatient Hospital Stay (HOSPITAL_COMMUNITY): Payer: Managed Care, Other (non HMO)

## 2014-11-06 LAB — CBC
HEMATOCRIT: 39.4 % (ref 39.0–52.0)
Hemoglobin: 13.4 g/dL (ref 13.0–17.0)
MCH: 31.4 pg (ref 26.0–34.0)
MCHC: 34 g/dL (ref 30.0–36.0)
MCV: 92.3 fL (ref 78.0–100.0)
Platelets: 160 10*3/uL (ref 150–400)
RBC: 4.27 MIL/uL (ref 4.22–5.81)
RDW: 14 % (ref 11.5–15.5)
WBC: 15.8 10*3/uL — ABNORMAL HIGH (ref 4.0–10.5)

## 2014-11-06 LAB — BASIC METABOLIC PANEL
Anion gap: 7 (ref 5–15)
BUN: 18 mg/dL (ref 6–20)
CHLORIDE: 107 mmol/L (ref 101–111)
CO2: 21 mmol/L — AB (ref 22–32)
Calcium: 7.7 mg/dL — ABNORMAL LOW (ref 8.9–10.3)
Creatinine, Ser: 0.9 mg/dL (ref 0.61–1.24)
GFR calc Af Amer: >60 mL/min (ref >60)
GFR calc non Af Amer: >60 mL/min (ref >60)
GLUCOSE: 111 mg/dL — AB (ref 65–99)
POTASSIUM: 3.7 mmol/L (ref 3.5–5.1)
Sodium: 135 mmol/L (ref 135–145)

## 2014-11-06 LAB — GLUCOSE, CAPILLARY
GLUCOSE-CAPILLARY: 109 mg/dL — AB (ref 65–99)
GLUCOSE-CAPILLARY: 108 mg/dL — AB (ref 65–99)
Glucose-Capillary: 131 mg/dL — ABNORMAL HIGH (ref 65–99)
Glucose-Capillary: 117 mg/dL — ABNORMAL HIGH (ref 65–99)

## 2014-11-06 MED ORDER — IOHEXOL 300 MG/ML  SOLN
100.0000 mL | Freq: Once | INTRAMUSCULAR | Status: AC | PRN
  Administered 2014-11-06: 100 mL via INTRAVENOUS

## 2014-11-06 MED ORDER — LORAZEPAM 2 MG/ML IJ SOLN
1.0000 mg | INTRAMUSCULAR | Status: DC | PRN
  Administered 2014-11-06: 1 mg via INTRAVENOUS
  Filled 2014-11-06: qty 1

## 2014-11-06 MED ORDER — NYSTATIN 100000 UNIT/ML MT SUSP
5.0000 mL | Freq: Four times a day (QID) | OROMUCOSAL | Status: DC
  Administered 2014-11-06 – 2014-11-10 (×13): 500000 [IU] via ORAL
  Filled 2014-11-06 (×17): qty 5

## 2014-11-06 MED ORDER — RESOURCE THICKENUP CLEAR PO POWD
ORAL | Status: DC | PRN
  Filled 2014-11-06: qty 125

## 2014-11-06 NOTE — Progress Notes (Signed)
[facility] PROGRESS NOTE  Ricky Rodriguez ZOX:[id number] DOB: Oct 15, 1940 DOA: 11/03/2014 PCP: Charolett Bumpers, MD  Assessment/Plan: 1. Community acquired vs aspiration pneumonia -improving, continue Rocephin/Zithromax -continues to be febrile, need to r/o other sources -swallow eval completed -blood cx-NGTD  2. 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 Dementia/Depression -stable, continue abilify  4. Elevated troponin -? Demand from fever/sepsis, no chest pain,EKG non specific -ECHO poor quality but normal EF and no wall motion abnormalities -troponin's trending down  5. AKI -improving with hydration, continue IVf  6. 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 Debility/Chronic L leg issues -Pt eval pending  8. Hypokalemia -due to HCTZ, replaced  DVT proph: lovenox  Code Status: DNR Family Communication: wife at bedside Disposition Plan: home when stable     HPI/Subjective: More confused earlier today, febrile yesterday evening  Objective: Filed Vitals:   11/06/14 0925  BP: 108/63  Pulse: 78  Temp: 98.1 F (36.7 C)  Resp: 18    Intake/Output Summary (Last 24 hours) at 11/06/14 1502 Last data filed at 11/06/14 0841  Gross per 24 hour  Intake    100 ml  Output    600 ml  Net   -500 ml   Filed Weights   11/03/14 2100 11/04/14 2044 11/05/14 2100  Weight: 77.1 kg (169 lb 15.6 oz) 77.8 kg (171 lb 8.3 oz) 76.6 kg (168 lb 14 oz)    Exam:   General:  Alert, awake, slow to respond, oriented to self, place  Cardiovascular: S1S2/RRR  Respiratory: ronchi at bases  Abdomen: soft, NT, BS present  Musculoskeletal: no edema c/c  Data Reviewed: Basic Metabolic  Panel:  Recent Labs Lab 11/03/14 1050 11/04/14 0530 11/05/14 0524 11/06/14 1211  NA 148* 146* 141 135  K 3.5 2.8* 3.1* 3.7  CL 115* 113* 109 107  CO2 23 23 21* 21*  GLUCOSE 158* 111* 113* 111*  BUN 51* 31* 22* 18  CREATININE 1.38* 1.21 1.05 0.90  CALCIUM 8.9 8.0* 7.6* 7.7*   Liver Function Tests:  Recent Labs Lab 11/03/14 1050 11/05/14 0524  AST 101* 38  ALT 87* 62  ALKPHOS 97 69  BILITOT 1.1 1.0  PROT 6.7 5.2*  ALBUMIN 3.5 2.6*   No results for input(s): LIPASE, AMYLASE in the last 168 hours. No results for input(s): AMMONIA in the last 168 hours. CBC:  Recent Labs Lab 11/03/14 1050 11/04/14 0530 11/05/14 0524 11/06/14 1211  WBC 19.6* 15.1* 16.1* 15.8*  NEUTROABS 16.8*  --   --   --   HGB 16.2 14.3 14.0 13.4  HCT 48.7 44.1 42.7 39.4  MCV 94.9 94.8 94.5 92.3  PLT 214 180 158 160   Cardiac Enzymes:  Recent Labs Lab 11/03/14 1050 11/03/14 1645 11/04/14 1010 11/04/14 1630  TROPONINI 0.26* 0.21* 0.16* 0.15*   BNP (last 3 results) No results for input(s): BNP in the last 8760 hours.  ProBNP (last 3 results) No results for input(s): PROBNP in the last 8760 hours.  CBG:  Recent Labs Lab 11/05/14 1106 11/05/14 1657 11/05/14 2209 11/06/14 0745 11/06/14 1158  GLUCAP 106* 84 112* 109* 108*    Recent Results (from the past 240 hour(s))  Culture, blood (routine x 2)  Status: None (Preliminary result)   Collection Time: 11/03/14 10:20 AM  Result Value Ref Range Status   Specimen Description BLOOD RIGHT ANTECUBITAL  Final   Special Requests BOTTLES DRAWN AEROBIC AND ANAEROBIC 5CC  Final   Culture NO GROWTH 3 DAYS  Final   Report Status PENDING  Incomplete  Culture, blood (routine x 2)     Status: None (Preliminary result)   Collection Time: 11/03/14 10:50 AM  Result Value Ref Range Status   Specimen Description BLOOD LEFT ANTECUBITAL  Final   Special Requests BOTTLES DRAWN AEROBIC AND ANAEROBIC 5CC  Final   Culture NO GROWTH 3 DAYS  Final    Report Status PENDING  Incomplete  Urine culture     Status: None   Collection Time: 11/03/14 11:13 AM  Result Value Ref Range Status   Specimen Description URINE, CATHETERIZED  Final   Special Requests NONE  Final   Culture NO GROWTH 1 DAY  Final   Report Status 11/04/2014 FINAL  Final  Culture, sputum-assessment     Status: None   Collection Time: 11/03/14  5:24 PM  Result Value Ref Range Status   Specimen Description SPUTUM  Final   Special Requests Normal  Final   Sputum evaluation   Final    MICROSCOPIC FINDINGS SUGGEST THAT THIS SPECIMEN IS NOT REPRESENTATIVE OF LOWER RESPIRATORY SECRETIONS. PLEASE RECOLLECT. NOTIFIED E CASTRO 1929 11/03/14 A BROWNING    Report Status 11/03/2014 FINAL  Final  Culture, blood (routine x 2)     Status: None (Preliminary result)   Collection Time: 11/05/14  8:00 PM  Result Value Ref Range Status   Specimen Description BLOOD LEFT HAND  Final   Special Requests BOTTLES DRAWN AEROBIC ONLY 1CC  Final   Culture NO GROWTH < 24 HOURS  Final   Report Status PENDING  Incomplete  Culture, blood (routine x 2)     Status: None (Preliminary result)   Collection Time: 11/05/14  8:09 PM  Result Value Ref Range Status   Specimen Description BLOOD RIGHT ARM  Final   Special Requests BOTTLES DRAWN AEROBIC AND ANAEROBIC 5CC   Final   Culture NO GROWTH < 24 HOURS  Final   Report Status PENDING  Incomplete     Studies: Dg Chest 2 View  11/06/2014   CLINICAL DATA:  Fever.  EXAM: CHEST  2 VIEW  COMPARISON:  11/02/2014  FINDINGS: Patient is rotated towards the right. Heart size is enlarged. There is patchy density seen in the lower lobe region on the lateral view consistent with infectious infiltrate, likely at the left lung base but difficult to visualize on the frontal view. Mild pulmonary vascular congestion.  IMPRESSION: 1. Cardiomegaly and vascular congestion. 2. Lower lobe infiltrate, likely at the left lung base.   Electronically Signed   By: Norva Pavlov M.D.    On: 11/06/2014 09:09    Scheduled Meds: . ARIPiprazole  5 mg Oral Q breakfast  . aspirin EC  81 mg Oral Daily  . azithromycin  500 mg Intravenous Q24H  . cefTRIAXone (ROCEPHIN)  IV  1 g Intravenous Q24H  . enoxaparin (LOVENOX) injection  40 mg Subcutaneous Q24H  . insulin aspart  0-9 Units Subcutaneous TID WC  . nystatin  5 mL Oral QID  . pantoprazole  40 mg Oral Daily  . potassium chloride SA  60 mEq Oral Daily   Continuous Infusions: . sodium chloride 50 mL/hr at 11/05/14 1155   Antibiotics Given (last 72 hours)  Date/Time Action Medication Dose Rate   11/03/14 1720 Given   cefTRIAXone (ROCEPHIN) 1 g in dextrose 5 % 50 mL IVPB 1 g 100 mL/hr   11/03/14 1834 Given   azithromycin (ZITHROMAX) 500 mg in dextrose 5 % 250 mL IVPB 500 mg 250 mL/hr   11/04/14 1802 Given   cefTRIAXone (ROCEPHIN) 1 g in dextrose 5 % 50 mL IVPB 1 g 100 mL/hr   11/04/14 1846 Given   azithromycin (ZITHROMAX) 500 mg in dextrose 5 % 250 mL IVPB 500 mg 250 mL/hr   11/05/14 1741 Given   cefTRIAXone (ROCEPHIN) 1 g in dextrose 5 % 50 mL IVPB 1 g 100 mL/hr   11/05/14 1930 Given   azithromycin (ZITHROMAX) 500 mg in dextrose 5 % 250 mL IVPB 500 mg 250 mL/hr      Active Problems:   Fever   CAP (community acquired pneumonia)   Elevated troponin   Acute renal injury    Time spent:    [facility]  [facility] Pager [phone]. If 7PM-7AM, please contact night-coverage at [email or web address], password [email or web address] 11/06/2014, 3:02 PM  LOS: 3 days

## 2014-11-06 NOTE — Care Management Important Message (Signed)
Important Message  Patient Details  Name: Ricky Rodriguez MRN: [id number] Date of Birth: 05/31/1940   Medicare Important Message Given:  Yes-second notification given    Orson Aloe 11/06/2014, 11:59 AM

## 2014-11-06 NOTE — Progress Notes (Signed)
Pt became combative, cursing at staff, pulling at lines and resisting care. Pt only alert to self. Wife at bedside and unable to calm pt down. Safety mittens placed. MD notified. Ativan PRN ordered. Pt given Ativan and currently resting with wife at bedside. Will continue to assess and monitor pt.   Jonell Cluck, RN

## 2014-11-06 NOTE — Progress Notes (Signed)
Speech Language Pathology Treatment: Dysphagia  Patient Details Name: Ricky Rodriguez MRN: [id number] DOB: 06/04/1940 Today's Date: 11/06/2014 Time: 4098-1191 SLP Time Calculation (min) (ACUTE ONLY): 32 min  Assessment / Plan / Recommendation Clinical Impression  Skilled treatment session focused on addressing dysphagia goals.  Patient partially reclined in bed and just finished working with PT today.  Upon SLP arrival wife was administering thin liquids via cup with an immediate wet cough response from patient; SLP re-educated her on safe swallow strategies of: sitting up, hand-over-hand assist with small cup sips and PO when patient is awake, alert, accepting and able to participate.  Per chart review patient received Ativan at lunch time today, which SLP suspects has resulted in increased lethargy this afternoon.  Patient intermittently accepting of hand over hand assist with trials of thin liquids via cup with consistent immediate wet coughs.  Implementation of nectar-thick liquids with small control sips reduced coughs to intermittent dry coughs, which are present at baseline.  Patient initiated reaching for, mastication of and swallowing regular textures without difficulty.  Recommend liquid downgrade to nectar-thick liquids.  SLP updated safe swallow sign to facilitate carryover.     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 Patient was brought to the hospital via EMS as his wife noted right-sided facial droop and difficulty ambulation. The emergency department, the patient did not demonstrate any focal weakness, but was found to have a fever. Due to the patient's dementia, and history is obtained by his wife as well as from the chart. His wife notes that he has not been following commands this morning and has not been able to walk with assistance, which he normally  does.  Chest x-ray revealed left lower lobe patchy airspace consolidation and right middle/ lower lobe streaky opacities, which may represent areas of scarring.   Pertinent Vitals Pain Assessment: No/denies pain  SLP Plan  Continue with current plan of care    Recommendations Diet recommendations: Regular;Thin liquid Liquids provided via: Cup;No straw Medication Administration: Whole meds with puree Supervision: Patient able to self feed;Full supervision/cueing for compensatory strategies;Trained caregiver to feed patient Compensations: Slow rate;Small sips/bites Postural Changes and/or Swallow Maneuvers: Seated upright 90 degrees              Oral Care Recommendations: Oral care BID Follow up Recommendations: Outpatient SLP;24 hour supervision/assistance;Home health SLP Plan: Continue with current plan of care    GO    Ricky Rodriguez., CCC-SLP [phone]  Ricky Rodriguez 11/06/2014, 2:52 PM

## 2014-11-06 NOTE — Evaluation (Addendum)
Physical Therapy Evaluation Patient Details Name: Ricky Rodriguez MRN: [id number] DOB: 07-Dec-1940 Today's Date: 11/06/2014   History of Present Illness  Patient is a 74 yo male admitted 11/03/14 with Rt facial droop, difficulty ambulating.  Patient with CAP, acute renal injury.  PMH:  Dementia, Lt THA became infected, Afib  Clinical Impression  Patient presents with problems listed below.  Will benefit from acute PT to maximize functional mobility prior to discharge.  Patient has 24 hour assist at d/c.  However, he needs to function at min assist with mobility/gait.  Today, patient required +2 max assist for bed mobility.  Recommend SNF at discharge for continued therapy prior to return home.    Follow Up Recommendations SNF;Supervision/Assistance - 24 hour    Equipment Recommendations  None recommended by PT    Recommendations for Other Services       Precautions / Restrictions Precautions Precautions: Fall Required Braces or Orthoses: Other Brace/Splint Other Brace/Splint: LE braces Restrictions Weight Bearing Restrictions: No      Mobility  Bed Mobility Overal bed mobility: Needs Assistance;+2 for physical assistance Bed Mobility: Rolling;Sidelying to Sit;Sit to Supine Rolling: Max assist;+2 for physical assistance Sidelying to sit: Max assist;+2 for physical assistance   Sit to supine: Total assist;+2 for physical assistance   General bed mobility comments: Verbal and tactile cues for technique.  Assist using bed pad and hand-over-hand assist to roll and reach for rail with RUE.  Max assist to move LE's off of bed, and to raise trunk to upright position.  Patient requires mod assist to maintain upright sitting balance, pushing with RUE toward Lt side.  Patient leaning left and posteriorly.  Patient sat EOB x 10 minutes.  Patient unable to follow commands to correct posture/balance.  Patient required +2 total assist to return to supine and scoot up toward HOB.  Transfers                  General transfer comment: Unable today.  Ambulation/Gait                Stairs            Wheelchair Mobility    Modified Rankin (Stroke Patients Only)       Balance Overall balance assessment: Needs assistance Sitting-balance support: Bilateral upper extremity supported;Feet supported Sitting balance-Leahy Scale: Poor Sitting balance - Comments: Patient with left lean, pushing with RUE.  No head righting noted.  Patient seemed unaware of body position in space. Postural control: Left lateral lean;Posterior lean                                   Pertinent Vitals/Pain Pain Assessment: No/denies pain    Home Living Family/patient expects to be discharged to:: Skilled nursing facility Living Arrangements: Spouse/significant other Available Help at Discharge: Family;Personal care attendant;Available 24 hours/day Type of Home: House Home Access: Ramped entrance     Home Layout: Two level;Able to live on main level with bedroom/bathroom (Converted living room into bedroom) Home Equipment: Dan Humphreys - 2 wheels;Hospital bed;Wheelchair - manual;Bedside commode      Prior Function Level of Independence: Needs assistance   Gait / Transfers Assistance Needed: Patient able to ambulate short distances with RW, LE braces, and min assist.  ADL's / Homemaking Assistance Needed: Assist for bathing back, washing hair.  Assist for meal prep.        Hand Dominance   Dominant Hand:  Left    Extremity/Trunk Assessment   Upper Extremity Assessment: RUE deficits/detail;LUE deficits/detail RUE Deficits / Details: Strength grossly 2+/5     LUE Deficits / Details: Strength grossly 3-/5   Lower Extremity Assessment: RLE deficits/detail;LLE deficits/detail RLE Deficits / Details: Strength grossly 3/5 - 3-/5 LLE Deficits / Details: Strength grossly 2+/5; Noted drop foot on Lt - has brace.     Communication   Communication: Expressive  difficulties (Garbled speech - no real words)  Cognition Arousal/Alertness: Lethargic;Suspect due to medications Behavior During Therapy: Flat affect Overall Cognitive Status: Impaired/Different from baseline Area of Impairment: Orientation;Attention;Memory;Following commands;Awareness;Problem solving Orientation Level: Disoriented to Current Attention Level: Focused   Following Commands: Follows one step commands inconsistently;Follows one step commands with increased time     Problem Solving: Slow processing;Decreased initiation;Difficulty sequencing;Requires verbal cues;Requires tactile cues General Comments: Difficult to assess due to communication deficits.    General Comments      Exercises        Assessment/Plan    PT Assessment Patient needs continued PT services  PT Diagnosis Difficulty walking;Generalized weakness;Altered mental status   PT Problem List Decreased strength;Decreased range of motion;Decreased activity tolerance;Decreased balance;Decreased mobility;Decreased coordination;Decreased cognition;Decreased knowledge of use of DME;Decreased safety awareness  PT Treatment Interventions DME instruction;Gait training;Functional mobility training;Therapeutic activities;Balance training;Neuromuscular re-education;Cognitive remediation;Patient/family education   PT Goals (Current goals can be found in the Care Plan section) Acute Rehab PT Goals Patient Stated Goal: Wife:  For patient to be able to ambulate short distances. PT Goal Formulation: With patient/family Time For Goal Achievement: 11/20/14 Potential to Achieve Goals: Fair    Frequency Min 2X/week   Barriers to discharge        Co-evaluation               End of Session   Activity Tolerance: Patient limited by fatigue;Patient limited by lethargy Patient left: in bed;with call bell/phone within reach;with bed alarm set;with family/visitor present Nurse Communication: Mobility status          Time: 1358-1416 and 15:09-15:16 PT Time Calculation (min) (ACUTE ONLY): 18 min + 7 min = 25 min total   Charges:   PT Evaluation $Initial PT Evaluation Tier I: 1 Procedure PT Treatments $Therapeutic Activity: 8-22 mins   PT G CodesVena Austria 11-18-2014, 7:26 PM Durenda Hurt. Renaldo Fiddler, [facility] Acute Rehab Services Pager [phone]

## 2014-11-07 LAB — GLUCOSE, CAPILLARY
GLUCOSE-CAPILLARY: 119 mg/dL — AB (ref 65–99)
GLUCOSE-CAPILLARY: 102 mg/dL — AB (ref 65–99)
GLUCOSE-CAPILLARY: 128 mg/dL — AB (ref 65–99)
GLUCOSE-CAPILLARY: 120 mg/dL — AB (ref 65–99)

## 2014-11-07 LAB — CBC
HCT: 37 % — ABNORMAL LOW (ref 39.0–52.0)
HEMOGLOBIN: 12.4 g/dL — AB (ref 13.0–17.0)
MCH: 31.1 pg (ref 26.0–34.0)
MCHC: 33.5 g/dL (ref 30.0–36.0)
MCV: 92.7 fL (ref 78.0–100.0)
Platelets: 176 10*3/uL (ref 150–400)
RBC: 3.99 MIL/uL — AB (ref 4.22–5.81)
RDW: 14 % (ref 11.5–15.5)
WBC: 13.2 10*3/uL — ABNORMAL HIGH (ref 4.0–10.5)

## 2014-11-07 LAB — BASIC METABOLIC PANEL
Anion gap: 9 (ref 5–15)
BUN: 15 mg/dL (ref 6–20)
CHLORIDE: 110 mmol/L (ref 101–111)
CO2: 21 mmol/L — ABNORMAL LOW (ref 22–32)
Calcium: 7.8 mg/dL — ABNORMAL LOW (ref 8.9–10.3)
Creatinine, Ser: 0.8 mg/dL (ref 0.61–1.24)
GFR calc non Af Amer: >60 mL/min (ref >60)
Glucose, Bld: 100 mg/dL — ABNORMAL HIGH (ref 65–99)
POTASSIUM: 3.7 mmol/L (ref 3.5–5.1)
SODIUM: 140 mmol/L (ref 135–145)

## 2014-11-07 NOTE — Progress Notes (Signed)
Physical Therapy Treatment Patient Details Name: ALVEN ALVERIO MRN: [id number] DOB: 1940-04-08 Today's Date: 11/07/2014    History of Present Illness Patient is a 74 yo male admitted 11/03/14 with Rt facial droop, difficulty ambulating.  Patient with CAP, acute renal injury.  PMH:  Dementia, Lt THA became infected, Afib    PT Comments    Patient more alert and able to participate with therapy today.  Improved sitting balance.  Continued to require +2 max assist for mobility.  Continue to recommend SNF at discharge.  Follow Up Recommendations  SNF;Supervision/Assistance - 24 hour     Equipment Recommendations  None recommended by PT    Recommendations for Other Services       Precautions / Restrictions Precautions Precautions: Fall Required Braces or Orthoses: Other Brace/Splint Other Brace/Splint: LLE AFO Restrictions Weight Bearing Restrictions: No    Mobility  Bed Mobility Overal bed mobility: Needs Assistance;+2 for physical assistance Bed Mobility: Supine to Sit;Sit to Supine     Supine to sit: Max assist;+2 for physical assistance Sit to supine: Max assist;+2 for physical assistance   General bed mobility comments: Verbal cues for technique.  Continued to require max assist to move to/from sitting.  Once upright, patient able to maintain upright sitting balance today with min guard assist.  Able to perform activities with BUE's and maintain balance - combing hair, washing face, etc.  Patient sat EOB x 10 minutes.    Transfers                    Ambulation/Gait                 Stairs            Wheelchair Mobility    Modified Rankin (Stroke Patients Only)       Balance Overall balance assessment: Needs assistance Sitting-balance support: No upper extremity supported;Feet supported Sitting balance-Leahy Scale: Fair Sitting balance - Comments: More upright posture, with improved static balance                             Cognition Arousal/Alertness: Awake/alert Behavior During Therapy: WFL for tasks assessed/performed;Flat affect Overall Cognitive Status: Impaired/Different from baseline     Current Attention Level: Sustained   Following Commands: Follows one step commands with increased time     Problem Solving: Slow processing;Decreased initiation;Difficulty sequencing;Requires verbal cues;Requires tactile cues General Comments: More alert and interactive today    Exercises      General Comments        Pertinent Vitals/Pain Pain Assessment: No/denies pain    Home Living                      Prior Function            PT Goals (current goals can now be found in the care plan section) Progress towards PT goals: Progressing toward goals    Frequency  Min 2X/week    PT Plan Current plan remains appropriate    Co-evaluation             End of Session   Activity Tolerance: Patient tolerated treatment well Patient left: in bed;with call bell/phone within reach;with bed alarm set;with family/visitor present     Time: 4098-1191 PT Time Calculation (min) (ACUTE ONLY): 19 min  Charges:  $Therapeutic Activity: 8-22 mins  G Codes:      Vena Austria 11/07/2014, 8:18 PM Durenda Hurt. Renaldo Fiddler, [facility] Acute Rehab Services Pager [phone]

## 2014-11-07 NOTE — Progress Notes (Signed)
[facility] PROGRESS NOTE  CORDERIUS SARACENI RUE:[id number] DOB: Jul 19, 1940 DOA: 11/03/2014 PCP: Charolett Bumpers, MD  Assessment/Plan: 1. Aspiration pneumonia -improving slowly, continue Rocephin/Zithromax -was febrile for 3days, now finally improving -swallow eval completed, suspect ongoing aspiration on current diet, speech following -blood cx-NGTD  2. Fevers-persistent -now improved, CT hip without abscess acute findings -unable to have MRI, h/o hardware -has been on long term suppressive abx for this   3. Dementia/Depression -stable, continue abilify  4. Elevated troponin -? Demand from fever/sepsis, no chest pain,EKG non specific -ECHO poor quality but normal EF and no wall motion abnormalities -troponin's trending down  5. AKI -improved with hydration, cut down IVf  6. 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 Debility/Chronic L leg issues -Pt eval completed, SNF recommended  8. Hypokalemia -due to HCTZ, replaced  DVT proph: lovenox  Code Status: DNR Family Communication: wife at bedside Disposition Plan: SNF ? Monday if stable     HPI/Subjective: More alert today, coughing with drink  Objective: Filed Vitals:   11/07/14 0837  BP: 128/83  Pulse: 79  Temp: 98.2 F (36.8 C)  Resp: 18    Intake/Output Summary (Last 24 hours) at 11/07/14 1331 Last data filed at 11/07/14 1216  Gross per 24 hour  Intake    780 ml  Output   1851 ml  Net  -1071 ml   Filed Weights   11/04/14 2044 11/05/14 2100 11/06/14 2111  Weight: 77.8 kg (171 lb 8.3 oz) 76.6 kg (168 lb 14 oz) 78.03 kg (172 lb 0.4 oz)    Exam:   General:  Alert, awake, oriented to self, place, more lucid this am  Cardiovascular: S1S2/RRR  Respiratory: ronchi at bases  Abdomen: soft, NT, BS present  Musculoskeletal: no edema c/c  Data Reviewed: Basic  Metabolic Panel:  Recent Labs Lab 11/03/14 1050 11/04/14 0530 11/05/14 0524 11/06/14 1211 11/07/14 0552  NA 148* 146* 141 135 140  K 3.5 2.8* 3.1* 3.7 3.7  CL 115* 113* 109 107 110  CO2 23 23 21* 21* 21*  GLUCOSE 158* 111* 113* 111* 100*  BUN 51* 31* 22* 18 15  CREATININE 1.38* 1.21 1.05 0.90 0.80  CALCIUM 8.9 8.0* 7.6* 7.7* 7.8*   Liver Function Tests:  Recent Labs Lab 11/03/14 1050 11/05/14 0524  AST 101* 38  ALT 87* 62  ALKPHOS 97 69  BILITOT 1.1 1.0  PROT 6.7 5.2*  ALBUMIN 3.5 2.6*   No results for input(s): LIPASE, AMYLASE in the last 168 hours. No results for input(s): AMMONIA in the last 168 hours. CBC:  Recent Labs Lab 11/03/14 1050 11/04/14 0530 11/05/14 0524 11/06/14 1211 11/07/14 0552  WBC 19.6* 15.1* 16.1* 15.8* 13.2*  NEUTROABS 16.8*  --   --   --   --   HGB 16.2 14.3 14.0 13.4 12.4*  HCT 48.7 44.1 42.7 39.4 37.0*  MCV 94.9 94.8 94.5 92.3 92.7  PLT 214 180 158 160 176   Cardiac Enzymes:  Recent Labs Lab 11/03/14 1050 11/03/14 1645 11/04/14 1010 11/04/14 1630  TROPONINI 0.26* 0.21* 0.16* 0.15*   BNP (last 3 results) No results for input(s): BNP in the last 8760 hours.  ProBNP (last 3 results) No results for input(s): PROBNP in the last 8760 hours.  CBG:  Recent Labs Lab 11/06/14 1158 11/06/14 1607 11/06/14 2153 11/07/14 0836 11/07/14 1128  GLUCAP 108* 131* 117* 119* 102*    Recent  Results (from the past 240 hour(s))  Culture, blood (routine x 2)     Status: None (Preliminary result)   Collection Time: 11/03/14 10:20 AM  Result Value Ref Range Status   Specimen Description BLOOD RIGHT ANTECUBITAL  Final   Special Requests BOTTLES DRAWN AEROBIC AND ANAEROBIC 5CC  Final   Culture NO GROWTH 4 DAYS  Final   Report Status PENDING  Incomplete  Culture, blood (routine x 2)     Status: None (Preliminary result)   Collection Time: 11/03/14 10:50 AM  Result Value Ref Range Status   Specimen Description BLOOD LEFT ANTECUBITAL   Final   Special Requests BOTTLES DRAWN AEROBIC AND ANAEROBIC 5CC  Final   Culture NO GROWTH 4 DAYS  Final   Report Status PENDING  Incomplete  Urine culture     Status: None   Collection Time: 11/03/14 11:13 AM  Result Value Ref Range Status   Specimen Description URINE, CATHETERIZED  Final   Special Requests NONE  Final   Culture NO GROWTH 1 DAY  Final   Report Status 11/04/2014 FINAL  Final  Culture, sputum-assessment     Status: None   Collection Time: 11/03/14  5:24 PM  Result Value Ref Range Status   Specimen Description SPUTUM  Final   Special Requests Normal  Final   Sputum evaluation   Final    MICROSCOPIC FINDINGS SUGGEST THAT THIS SPECIMEN IS NOT REPRESENTATIVE OF LOWER RESPIRATORY SECRETIONS. PLEASE RECOLLECT. NOTIFIED E CASTRO 1929 11/03/14 A BROWNING    Report Status 11/03/2014 FINAL  Final  Culture, blood (routine x 2)     Status: None (Preliminary result)   Collection Time: 11/05/14  8:00 PM  Result Value Ref Range Status   Specimen Description BLOOD LEFT HAND  Final   Special Requests BOTTLES DRAWN AEROBIC ONLY 1CC  Final   Culture NO GROWTH 2 DAYS  Final   Report Status PENDING  Incomplete  Culture, blood (routine x 2)     Status: None (Preliminary result)   Collection Time: 11/05/14  8:09 PM  Result Value Ref Range Status   Specimen Description BLOOD RIGHT ARM  Final   Special Requests BOTTLES DRAWN AEROBIC AND ANAEROBIC 5CC   Final   Culture NO GROWTH 2 DAYS  Final   Report Status PENDING  Incomplete     Studies: Dg Chest 2 View  11/06/2014   CLINICAL DATA:  Fever.  EXAM: CHEST  2 VIEW  COMPARISON:  11/02/2014  FINDINGS: Patient is rotated towards the right. Heart size is enlarged. There is patchy density seen in the lower lobe region on the lateral view consistent with infectious infiltrate, likely at the left lung base but difficult to visualize on the frontal view. Mild pulmonary vascular congestion.  IMPRESSION: 1. Cardiomegaly and vascular congestion. 2.  Lower lobe infiltrate, likely at the left lung base.   Electronically Signed   By: Norva Pavlov M.D.   On: 11/06/2014 09:09   Ct Hip Left W Contrast  11/06/2014   CLINICAL DATA:  Fever.  Elevated white blood cell count.  EXAM: CT OF THE LEFT HIP WITH CONTRAST  TECHNIQUE: Multidetector CT imaging was performed following the standard protocol during bolus administration of intravenous contrast.  CONTRAST:  OMNIPAQUE IOHEXOL 300 MG/ML  SOLN  COMPARISON:  Radiographs 09/24/2011.  FINDINGS: Modular LEFT hip arthroplasty is present. There is lucency along the inferior margin of the acetabular cup however there is good bony ingrowth around the superior margin of the acetabular  cup. The lucency around the inferior cup is of questionable significance. No loosening of the femoral component is identified.  There is no soft tissue abscess identified. Calcification is present anterior to the LEFT acetabulum which probably lies within the LEFT iliopsoas tendon. Visceral pelvis grossly appears within normal limits. Large stool burden is present in the rectum. No LEFT inguinal adenopathy. Atherosclerosis. No acute vascular abnormality. LEFT sacroiliac joint osteoarthritis. No areas of osteolysis are present to suggest osteomyelitis. Chronic deformity of the LEFT acetabulum is present. The acetabular cup is located posteriorly to the normal position, likely secondary to revision or trauma. Atrophy of the iliacus muscle is probably secondary to disuse. Calcific tendinitis of the LEFT common hamstring origin.  IMPRESSION: 1. No abscess or acute abnormality. 2. No current complicating features of modular LEFT hip arthroplasty.   Electronically Signed   By: Andreas Newport M.D.   On: 11/06/2014 19:03    Scheduled Meds: . ARIPiprazole  5 mg Oral Q breakfast  . aspirin EC  81 mg Oral Daily  . azithromycin  500 mg Intravenous Q24H  . cefTRIAXone (ROCEPHIN)  IV  1 g Intravenous Q24H  . enoxaparin (LOVENOX) injection  40  mg Subcutaneous Q24H  . insulin aspart  0-9 Units Subcutaneous TID WC  . nystatin  5 mL Oral QID  . pantoprazole  40 mg Oral Daily  . potassium chloride SA  60 mEq Oral Daily   Continuous Infusions: . sodium chloride 50 mL/hr at 11/05/14 1155   Antibiotics Given (last 72 hours)    Date/Time Action Medication Dose Rate   11/04/14 1802 Given   cefTRIAXone (ROCEPHIN) 1 g in dextrose 5 % 50 mL IVPB 1 g 100 mL/hr   11/04/14 1846 Given   azithromycin (ZITHROMAX) 500 mg in dextrose 5 % 250 mL IVPB 500 mg 250 mL/hr   11/05/14 1741 Given   cefTRIAXone (ROCEPHIN) 1 g in dextrose 5 % 50 mL IVPB 1 g 100 mL/hr   11/05/14 1930 Given   azithromycin (ZITHROMAX) 500 mg in dextrose 5 % 250 mL IVPB 500 mg 250 mL/hr   11/06/14 1700 Given   cefTRIAXone (ROCEPHIN) 1 g in dextrose 5 % 50 mL IVPB 1 g 100 mL/hr   11/06/14 1700 Given   azithromycin (ZITHROMAX) 500 mg in dextrose 5 % 250 mL IVPB 500 mg 250 mL/hr      Active Problems:   Fever   CAP (community acquired pneumonia)   Elevated troponin   Acute renal injury    Time spent:    [facility]  [facility] Pager [phone]. If 7PM-7AM, please contact night-coverage at [email or web address], password [facility] 11/07/2014, 1:31 PM  LOS: 4 days

## 2014-11-08 LAB — CBC
HCT: 36.6 % — ABNORMAL LOW (ref 39.0–52.0)
Hemoglobin: 12.4 g/dL — ABNORMAL LOW (ref 13.0–17.0)
MCH: 31.4 pg (ref 26.0–34.0)
MCHC: 33.9 g/dL (ref 30.0–36.0)
MCV: 92.7 fL (ref 78.0–100.0)
PLATELETS: 230 10*3/uL (ref 150–400)
RBC: 3.95 MIL/uL — AB (ref 4.22–5.81)
RDW: 14 % (ref 11.5–15.5)
WBC: 13.4 10*3/uL — ABNORMAL HIGH (ref 4.0–10.5)

## 2014-11-08 LAB — GLUCOSE, CAPILLARY
GLUCOSE-CAPILLARY: 111 mg/dL — AB (ref 65–99)
Glucose-Capillary: 115 mg/dL — ABNORMAL HIGH (ref 65–99)

## 2014-11-08 LAB — CULTURE, BLOOD (ROUTINE X 2)
CULTURE: NO GROWTH
Culture: NO GROWTH

## 2014-11-08 LAB — BASIC METABOLIC PANEL
Anion gap: 7 (ref 5–15)
BUN: 14 mg/dL (ref 6–20)
CALCIUM: 7.7 mg/dL — AB (ref 8.9–10.3)
CO2: 21 mmol/L — ABNORMAL LOW (ref 22–32)
CREATININE: 0.96 mg/dL (ref 0.61–1.24)
Chloride: 109 mmol/L (ref 101–111)
GFR calc Af Amer: >60 mL/min (ref >60)
GLUCOSE: 106 mg/dL — AB (ref 65–99)
POTASSIUM: 3.8 mmol/L (ref 3.5–5.1)
SODIUM: 137 mmol/L (ref 135–145)

## 2014-11-08 MED ORDER — INFLUENZA VAC SPLIT QUAD 0.5 ML IM SUSY
0.5000 mL | PREFILLED_SYRINGE | INTRAMUSCULAR | Status: AC
  Administered 2014-11-08: 0.5 mL via INTRAMUSCULAR
  Filled 2014-11-08: qty 0.5

## 2014-11-08 MED ORDER — AMOXICILLIN-POT CLAVULANATE 875-125 MG PO TABS
1.0000 | ORAL_TABLET | Freq: Two times a day (BID) | ORAL | Status: DC
  Administered 2014-11-08 – 2014-11-09 (×4): 1 via ORAL
  Filled 2014-11-08 (×4): qty 1

## 2014-11-08 NOTE — Clinical Social Work Placement (Signed)
   CLINICAL SOCIAL WORK PLACEMENT  NOTE  Date:  11/08/2014  Patient Details  Name: CHASEN MENDELL MRN: [id number] Date of Birth: Apr 29, 1940  Clinical Social Work is seeking post-discharge placement for this patient at the Skilled  Nursing Facility level of care (*CSW will initial, date and re-position this form in  chart as items are completed):  Yes   Patient/family provided with [facility] Clinical Social Work Department's list of facilities offering this level of care within the geographic area requested by the patient (or if unable, by the patient's family).  Yes   Patient/family informed of their freedom to choose among providers that offer the needed level of care, that participate in Medicare, Medicaid or managed care program needed by the patient, have an available bed and are willing to accept the patient.  Yes   Patient/family informed of [facility]'s ownership interest in [facility] and [facility], as well as of the fact that they are under no obligation to receive care at these facilities.  PASRR submitted to EDS on       PASRR number received on       Existing PASRR number confirmed on 11/08/14     FL2 transmitted to all facilities in geographic area requested by pt/family on 11/08/14     FL2 transmitted to all facilities within larger geographic area on       Patient informed that his/her managed care company has contracts with or will negotiate with certain facilities, including the following:            Patient/family informed of bed offers received.  Patient chooses bed at       Physician recommends and patient chooses bed at      Patient to be transferred to   on  .  Patient to be transferred to facility by       Patient family notified on   of transfer.  Name of family member notified:        PHYSICIAN Please prepare priority discharge summary, including medications, Please sign DNR, Please prepare prescriptions, Please sign FL2      Additional Comment:    _______________________________________________ Sharol Harness, LCSWA Weekend CSW [phone]

## 2014-11-08 NOTE — Clinical Social Work Note (Signed)
Clinical Social Work Assessment  Patient Details  Name: Ricky Rodriguez MRN: [id number] Date of Birth: Jan 08, 1941  Date of referral:  11/08/14               Reason for consult:  Facility Placement                Permission sought to share information with:  Oceanographer granted to share information::  Yes, Verbal Permission Granted (Granted by pt wife)  Name::        Agency::  SNFs for referral purposes  Relationship::     Contact Information:     Housing/Transportation Living arrangements for the past 2 months:  Single Family Home Source of Information:  Spouse Patient Interpreter Needed:  None Criminal Activity/Legal Involvement Pertinent to Current Situation/Hospitalization:  No - Comment as needed Significant Relationships:  Adult Children, Spouse Lives with:  Spouse Do you feel safe going back to the place where you live?  No Need for family participation in patient care:  Yes (Comment)  Care giving concerns:  Per MD, pt will need SNF at [facility]   Social Worker assessment / plan:  CSW visited pt room and spoke with pt wife about dc plan. She confirmed agreement for SNF. CSW explained SNF referral process and pt wife agreeable to referral being sent to all [facility]. She expressed preference for [facility] or potentially [facility]. Pt wife did have questions about insurance coverage as she states that pt Ricky Rodriguez is primary over Medicare. CSW notified pt wife that facility of choice will run pt insurance and be able to provide her with more information.   Employment status:  Retired Engineer, mining, [employer] (Pt wife reports Ricky Rodriguez is primary) PT Recommendations:  Skilled Nursing Facility Information / Referral to community resources:  Skilled Nursing Facility  Patient/Family's Response to care:  Pt wife agreeable to SNF dc  Patient/Family's Understanding of and Emotional Response to Diagnosis, Current Treatment, and  Prognosis:  Pt wife has good insight on pt condition. Pt wife worried about placement process but emotional response appropriate.  Emotional Assessment Appearance:  Appears stated age, Well-Groomed Attitude/Demeanor/Rapport:  Unable to Assess Affect (typically observed):  Unable to Assess Orientation:  Oriented to Self Alcohol / Substance use:  Not Applicable Psych involvement (Current and /or in the community):  No (Comment)  Discharge Needs  Concerns to be addressed:  Discharge Planning Concerns Readmission within the last 30 days:  No Current discharge risk:  Dependent with Mobility, Cognitively Impaired, Chronically ill Barriers to Discharge:  Continued Medical Work up   [employer], Theresia Majors Weekend CSW [phone]

## 2014-11-08 NOTE — Progress Notes (Signed)
[facility] PROGRESS NOTE  Ricky Rodriguez WUJ:[id number] DOB: [birth year] DOA: 11/03/2014 PCP: Charolett Bumpers, MD  Assessment/Plan: 1. Aspiration pneumonia -improving slowly, was on Rocephin/Zithromax, change to Augmentin 9/11 -was febrile for 3days, now finally afebrile -swallow eval completed, suspect ongoing aspiration on current diet, speech following -blood cx-NGTD  2. Fevers-persistent -now improved, afebrile for 24H, CT hip without abscess acute findings -unable to have MRI, h/o hardware -has been on long term suppressive abx for this   3. Dementia/Depression -stable, continue abilify  4. Elevated troponin -? Demand from fever/sepsis, no chest pain,EKG non specific -ECHO poor quality but normal EF and no wall motion abnormalities -troponin's trending down  5. AKI -improved with hydration, cut down IVf  6. 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 Debility/Chronic L leg issues -Pt eval completed, SNF recommended  8. Hypokalemia -due to HCTZ, replaced  DVT proph: lovenox  Code Status: DNR Family Communication: wife at bedside Disposition Plan: SNF ? Monday if stable     HPI/Subjective: More alert today, no complaints  Objective: Filed Vitals:   11/08/14 0926  BP: 133/82  Pulse: 95  Temp: 98.1 F (36.7 C)  Resp: 20    Intake/Output Summary (Last 24 hours) at 11/08/14 1426 Last data filed at 11/08/14 1233  Gross per 24 hour  Intake    540 ml  Output    750 ml  Net   -210 ml   Filed Weights   11/05/14 2100 11/06/14 2111 11/07/14 2125  Weight: 76.6 kg (168 lb 14 oz) 78.03 kg (172 lb 0.4 oz) 80 kg (176 lb 5.9 oz)    Exam:   General:  Alert, awake, oriented to self, place, more lucid this am  Cardiovascular: S1S2/RRR  Respiratory: ronchi at bases  Abdomen:  soft, NT, BS present  Musculoskeletal: no edema c/c  Data Reviewed: Basic Metabolic Panel:  Recent Labs Lab 11/04/14 0530 11/05/14 0524 11/06/14 1211 11/07/14 0552 11/08/14 0605  NA 146* 141 135 140 137  K 2.8* 3.1* 3.7 3.7 3.8  CL 113* 109 107 110 109  CO2 23 21* 21* 21* 21*  GLUCOSE 111* 113* 111* 100* 106*  BUN 31* 22* 18 15 14   CREATININE 1.21 1.05 0.90 0.80 0.96  CALCIUM 8.0* 7.6* 7.7* 7.8* 7.7*   Liver Function Tests:  Recent Labs Lab 11/03/14 1050 11/05/14 0524  AST 101* 38  ALT 87* 62  ALKPHOS 97 69  BILITOT 1.1 1.0  PROT 6.7 5.2*  ALBUMIN 3.5 2.6*   No results for input(s): LIPASE, AMYLASE in the last 168 hours. No results for input(s): AMMONIA in the last 168 hours. CBC:  Recent Labs Lab 11/03/14 1050 11/04/14 0530 11/05/14 0524 11/06/14 1211 11/07/14 0552 11/08/14 0605  WBC 19.6* 15.1* 16.1* 15.8* 13.2* 13.4*  NEUTROABS 16.8*  --   --   --   --   --   HGB 16.2 14.3 14.0 13.4 12.4* 12.4*  HCT 48.7 44.1 42.7 39.4 37.0* 36.6*  MCV 94.9 94.8 94.5 92.3 92.7 92.7  PLT 214 180 158 160 176 230   Cardiac Enzymes:  Recent Labs Lab 11/03/14 1050 11/03/14 1645 11/04/14 1010 11/04/14 1630  TROPONINI 0.26* 0.21* 0.16* 0.15*   BNP (last 3 results) No results for input(s): BNP in the last 8760 hours.  ProBNP (last 3 results) No results for input(s): PROBNP in the last 8760 hours.  CBG:  Recent Labs Lab 11/07/14 0836 11/07/14 1128 11/07/14 1711 11/07/14 2125 11/08/14 0807  GLUCAP 119* 102* 128* 120* 111*    Recent Results (from the past 240 hour(s))  Culture, blood (routine x 2)     Status: None (Preliminary result)   Collection Time: 11/03/14 10:20 AM  Result Value Ref Range Status   Specimen Description BLOOD RIGHT ANTECUBITAL  Final   Special Requests BOTTLES DRAWN AEROBIC AND ANAEROBIC 5CC  Final   Culture NO GROWTH 4 DAYS  Final   Report Status PENDING  Incomplete  Culture, blood (routine x 2)     Status: None (Preliminary  result)   Collection Time: 11/03/14 10:50 AM  Result Value Ref Range Status   Specimen Description BLOOD LEFT ANTECUBITAL  Final   Special Requests BOTTLES DRAWN AEROBIC AND ANAEROBIC 5CC  Final   Culture NO GROWTH 4 DAYS  Final   Report Status PENDING  Incomplete  Urine culture     Status: None   Collection Time: 11/03/14 11:13 AM  Result Value Ref Range Status   Specimen Description URINE, CATHETERIZED  Final   Special Requests NONE  Final   Culture NO GROWTH 1 DAY  Final   Report Status 11/04/2014 FINAL  Final  Culture, sputum-assessment     Status: None   Collection Time: 11/03/14  5:24 PM  Result Value Ref Range Status   Specimen Description SPUTUM  Final   Special Requests Normal  Final   Sputum evaluation   Final    MICROSCOPIC FINDINGS SUGGEST THAT THIS SPECIMEN IS NOT REPRESENTATIVE OF LOWER RESPIRATORY SECRETIONS. PLEASE RECOLLECT. NOTIFIED E CASTRO 1929 11/03/14 A BROWNING    Report Status 11/03/2014 FINAL  Final  Culture, blood (routine x 2)     Status: None (Preliminary result)   Collection Time: 11/05/14  8:00 PM  Result Value Ref Range Status   Specimen Description BLOOD LEFT HAND  Final   Special Requests BOTTLES DRAWN AEROBIC ONLY 1CC  Final   Culture NO GROWTH 2 DAYS  Final   Report Status PENDING  Incomplete  Culture, blood (routine x 2)     Status: None (Preliminary result)   Collection Time: 11/05/14  8:09 PM  Result Value Ref Range Status   Specimen Description BLOOD RIGHT ARM  Final   Special Requests BOTTLES DRAWN AEROBIC AND ANAEROBIC 5CC   Final   Culture NO GROWTH 2 DAYS  Final   Report Status PENDING  Incomplete     Studies: Ct Hip Left W Contrast  20-Nov-2014   CLINICAL DATA:  Fever.  Elevated white blood cell count.  EXAM: CT OF THE LEFT HIP WITH CONTRAST  TECHNIQUE: Multidetector CT imaging was performed following the standard protocol during bolus administration of intravenous contrast.  CONTRAST:  OMNIPAQUE IOHEXOL 300 MG/ML  SOLN   COMPARISON:  Radiographs 09/24/2011.  FINDINGS: Modular LEFT hip arthroplasty is present. There is lucency along the inferior margin of the acetabular cup however there is good bony ingrowth around the superior margin of the acetabular cup. The lucency around the inferior cup is of questionable significance. No loosening of the femoral component is identified.  There is no soft tissue abscess identified. Calcification is present anterior to the LEFT acetabulum which probably lies within the LEFT iliopsoas tendon. Visceral pelvis grossly appears within normal limits. Large stool burden is present in the rectum. No LEFT inguinal adenopathy. Atherosclerosis. No acute vascular abnormality. LEFT sacroiliac joint osteoarthritis. No areas of osteolysis are present to suggest osteomyelitis. Chronic  deformity of the LEFT acetabulum is present. The acetabular cup is located posteriorly to the normal position, likely secondary to revision or trauma. Atrophy of the iliacus muscle is probably secondary to disuse. Calcific tendinitis of the LEFT common hamstring origin.  IMPRESSION: 1. No abscess or acute abnormality. 2. No current complicating features of modular LEFT hip arthroplasty.   Electronically Signed   By: Andreas Newport M.D.   On: 11/06/2014 19:03    Scheduled Meds: . amoxicillin-clavulanate  1 tablet Oral Q12H  . ARIPiprazole  5 mg Oral Q breakfast  . aspirin EC  81 mg Oral Daily  . enoxaparin (LOVENOX) injection  40 mg Subcutaneous Q24H  . nystatin  5 mL Oral QID  . pantoprazole  40 mg Oral Daily  . potassium chloride SA  60 mEq Oral Daily   Continuous Infusions:   Antibiotics Given (last 72 hours)    Date/Time Action Medication Dose Rate   11/05/14 1741 Given   cefTRIAXone (ROCEPHIN) 1 g in dextrose 5 % 50 mL IVPB 1 g 100 mL/hr   11/05/14 1930 Given   azithromycin (ZITHROMAX) 500 mg in dextrose 5 % 250 mL IVPB 500 mg 250 mL/hr   11/06/14 1700 Given   cefTRIAXone (ROCEPHIN) 1 g in dextrose 5 %  50 mL IVPB 1 g 100 mL/hr   11/06/14 1700 Given   azithromycin (ZITHROMAX) 500 mg in dextrose 5 % 250 mL IVPB 500 mg 250 mL/hr   11/07/14 1753 Given   cefTRIAXone (ROCEPHIN) 1 g in dextrose 5 % 50 mL IVPB 1 g 100 mL/hr   11/07/14 1753 Given   azithromycin (ZITHROMAX) 500 mg in dextrose 5 % 250 mL IVPB 500 mg 250 mL/hr   11/08/14 1355 Given   amoxicillin-clavulanate (AUGMENTIN) 875-125 MG per tablet 1 tablet 1 tablet       Active Problems:   Fever   CAP (community acquired pneumonia)   Elevated troponin   Acute renal injury    Time spent:    [facility]  [facility] Pager [phone]. If 7PM-7AM, please contact night-coverage at [email or web address], password [facility] 11/08/2014, 2:26 PM  LOS: 5 days

## 2014-11-09 ENCOUNTER — Inpatient Hospital Stay (HOSPITAL_COMMUNITY): Payer: Managed Care, Other (non HMO)

## 2014-11-09 LAB — BASIC METABOLIC PANEL
Anion gap: 7 (ref 5–15)
BUN: 11 mg/dL (ref 6–20)
CHLORIDE: 105 mmol/L (ref 101–111)
CO2: 21 mmol/L — ABNORMAL LOW (ref 22–32)
CREATININE: 0.91 mg/dL (ref 0.61–1.24)
Calcium: 7.6 mg/dL — ABNORMAL LOW (ref 8.9–10.3)
Glucose, Bld: 102 mg/dL — ABNORMAL HIGH (ref 65–99)
Potassium: 4.1 mmol/L (ref 3.5–5.1)
SODIUM: 133 mmol/L — AB (ref 135–145)

## 2014-11-09 LAB — CBC
HCT: 37.7 % — ABNORMAL LOW (ref 39.0–52.0)
HEMOGLOBIN: 12.5 g/dL — AB (ref 13.0–17.0)
MCH: 31 pg (ref 26.0–34.0)
MCHC: 33.2 g/dL (ref 30.0–36.0)
MCV: 93.5 fL (ref 78.0–100.0)
PLATELETS: 282 10*3/uL (ref 150–400)
RBC: 4.03 MIL/uL — AB (ref 4.22–5.81)
RDW: 14 % (ref 11.5–15.5)
WBC: 12.4 10*3/uL — ABNORMAL HIGH (ref 4.0–10.5)

## 2014-11-09 LAB — GLUCOSE, CAPILLARY
GLUCOSE-CAPILLARY: 100 mg/dL — AB (ref 65–99)
GLUCOSE-CAPILLARY: 102 mg/dL — AB (ref 65–99)
GLUCOSE-CAPILLARY: 145 mg/dL — AB (ref 65–99)
Glucose-Capillary: 116 mg/dL — ABNORMAL HIGH (ref 65–99)

## 2014-11-09 MED ORDER — ALBUTEROL SULFATE (2.5 MG/3ML) 0.083% IN NEBU
2.5000 mg | INHALATION_SOLUTION | RESPIRATORY_TRACT | Status: DC | PRN
  Administered 2014-11-09: 2.5 mg via RESPIRATORY_TRACT
  Filled 2014-11-09: qty 3

## 2014-11-09 MED ORDER — HYDROCOD POLST-CPM POLST ER 10-8 MG/5ML PO SUER
5.0000 mL | Freq: Two times a day (BID) | ORAL | Status: DC | PRN
  Administered 2014-11-10: 5 mL via ORAL
  Filled 2014-11-09 (×2): qty 5

## 2014-11-09 NOTE — Progress Notes (Signed)
Speech Language Pathology Treatment: Dysphagia  Patient Details Name: Ricky Rodriguez MRN: [id number] DOB: Jan 21, 1941 Today's Date: 11/09/2014 Time: 7829-5621 SLP Time Calculation (min) (ACUTE ONLY): 8 min  Assessment / Plan / Recommendation Clinical Impression  Dysphagia intervention brief with wife at bedside. Pt consumed 2 sips nectar water and asked that SLP leave with wife assisting in explaining role of therapist (suspect pt wanted to decrease HOB). No s/s aspiration with limited observation. SLP answered wife's questions and confirmed she is comfortable with thickening pt's liquids.  Plans for discharge possibly today. Recommend continue ST at SNF with mechanical soft and nectar thick liquids.   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 Patient was brought to the hospital via EMS as his wife noted right-sided facial droop and difficulty ambulation. The emergency department, the patient did not demonstrate any focal weakness, but was found to have a fever. Due to the patient's dementia, and history is obtained by his wife as well as from the chart. His wife notes that he has not been following commands this morning and has not been able to walk with assistance, which he normally does.  Chest x-ray revealed left lower lobe patchy airspace consolidation and right middle/ lower lobe streaky opacities, which may represent areas of scarring. BSE    Pertinent Vitals Pain Assessment: No/denies pain  SLP Plan  Continue with current plan of care    Recommendations Diet recommendations: Dysphagia 3 (mechanical soft);Nectar-thick liquid Liquids provided via: Cup;No straw Medication Administration: Whole meds with puree Supervision: Patient able to self feed;Full supervision/cueing for compensatory strategies Compensations: Slow rate;Small sips/bites Postural Changes  and/or Swallow Maneuvers: Seated upright 90 degrees              Oral Care Recommendations: Oral care BID Follow up Recommendations: Skilled Nursing facility Plan: Continue with current plan of care    GO     Royce Macadamia 11/09/2014, 2:00 PM   Breck Coons Lonell Face.Ed [employer] [phone]

## 2014-11-09 NOTE — Progress Notes (Signed)
[facility] PROGRESS NOTE  Ricky Rodriguez:[id number] DOB: 1940-10-16 DOA: 11/03/2014 PCP: Charolett Bumpers, MD  Assessment/Plan: 1. Aspiration pneumonia -improving slowly, was on Rocephin/Zithromax, changed to Augmentin 9/11 -was febrile for 3days, now finally afebrile -swallow eval completed, suspect ongoing aspiration on current diet, speech following -blood cx-NGTD -persistent and worsening cough, will check CXR -speech to re-eval  2. Fevers-persistent -now improved, afebrile for 24H, CT hip without abscess acute findings -unable to have MRI, h/o hardware -has been on long term suppressive abx for this   3. Dementia/Depression -stable, continue abilify  4. Elevated troponin -? Demand from fever/sepsis, no chest pain,EKG non specific -ECHO poor quality but normal EF and no wall motion abnormalities -troponin's trending down  5. AKI -improved with hydration, cut down IVf  6. 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 Debility/Chronic L leg issues -Pt eval completed, SNF recommended  8. Hypokalemia -due to HCTZ, replaced  DVT proph: lovenox  Code Status: DNR Family Communication: wife at bedside Disposition Plan: SNF ? Monday if stable     HPI/Subjective: Continues to have worsening cough, esp with liquids  Objective: Filed Vitals:   11/09/14 0908  BP: 131/72  Pulse: 82  Temp: 97.6 F (36.4 C)  Resp: 18    Intake/Output Summary (Last 24 hours) at 11/09/14 1245 Last data filed at 11/09/14 0600  Gross per 24 hour  Intake    480 ml  Output   1450 ml  Net   -970 ml   Filed Weights   11/06/14 2111 11/07/14 2125 11/08/14 2114  Weight: 78.03 kg (172 lb 0.4 oz) 80 kg (176 lb 5.9 oz) 79.4 kg (175 lb 0.7 oz)    Exam:   General:  Alert, awake, oriented to self, place,  more lucid this am  Cardiovascular: S1S2/RRR  Respiratory: ronchi at bases  Abdomen: soft, NT, BS present  Musculoskeletal: no edema c/c  Data Reviewed: Basic Metabolic Panel:  Recent Labs Lab 11/05/14 0524 11/06/14 1211 11/07/14 0552 11/08/14 0605 11/09/14 0442  NA 141 135 140 137 133*  K 3.1* 3.7 3.7 3.8 4.1  CL 109 107 110 109 105  CO2 21* 21* 21* 21* 21*  GLUCOSE 113* 111* 100* 106* 102*  BUN 22* 18 15 14 11   CREATININE 1.05 0.90 0.80 0.96 0.91  CALCIUM 7.6* 7.7* 7.8* 7.7* 7.6*   Liver Function Tests:  Recent Labs Lab 11/03/14 1050 11/05/14 0524  AST 101* 38  ALT 87* 62  ALKPHOS 97 69  BILITOT 1.1 1.0  PROT 6.7 5.2*  ALBUMIN 3.5 2.6*   No results for input(s): LIPASE, AMYLASE in the last 168 hours. No results for input(s): AMMONIA in the last 168 hours. CBC:  Recent Labs Lab 11/03/14 1050  11/05/14 0524 11/06/14 1211 11/07/14 0552 11/08/14 0605 11/09/14 0442  WBC 19.6*  < > 16.1* 15.8* 13.2* 13.4* 12.4*  NEUTROABS 16.8*  --   --   --   --   --   --   HGB 16.2  < > 14.0 13.4 12.4* 12.4* 12.5*  HCT 48.7  < > 42.7 39.4 37.0* 36.6* 37.7*  MCV 94.9  < > 94.5 92.3 92.7 92.7 93.5  PLT 214  < > 158 160 176 230 282  < > = values in this interval not displayed. Cardiac Enzymes:  Recent Labs Lab 11/03/14 1050 11/03/14 1645 11/04/14 1010 11/04/14 1630  TROPONINI 0.26* 0.21* 0.16* 0.15*   BNP (last 3 results) No results for input(s): BNP in the last 8760 hours.  ProBNP (last 3 results) No results for input(s): PROBNP in the last 8760 hours.  CBG:  Recent Labs Lab 11/07/14 2125 11/08/14 0807 11/08/14 2115 11/09/14 0744 11/09/14 1133  GLUCAP 120* 111* 115* 100* 116*    Recent Results (from the past 240 hour(s))  Culture, blood (routine x 2)     Status: None   Collection Time: 11/03/14 10:20 AM  Result Value Ref Range Status   Specimen Description BLOOD RIGHT ANTECUBITAL  Final   Special Requests BOTTLES DRAWN AEROBIC AND ANAEROBIC 5CC   Final   Culture NO GROWTH 5 DAYS  Final   Report Status 11/08/2014 FINAL  Final  Culture, blood (routine x 2)     Status: None   Collection Time: 11/03/14 10:50 AM  Result Value Ref Range Status   Specimen Description BLOOD LEFT ANTECUBITAL  Final   Special Requests BOTTLES DRAWN AEROBIC AND ANAEROBIC 5CC  Final   Culture NO GROWTH 5 DAYS  Final   Report Status 11/08/2014 FINAL  Final  Urine culture     Status: None   Collection Time: 11/03/14 11:13 AM  Result Value Ref Range Status   Specimen Description URINE, CATHETERIZED  Final   Special Requests NONE  Final   Culture NO GROWTH 1 DAY  Final   Report Status 11/04/2014 FINAL  Final  Culture, sputum-assessment     Status: None   Collection Time: 11/03/14  5:24 PM  Result Value Ref Range Status   Specimen Description SPUTUM  Final   Special Requests Normal  Final   Sputum evaluation   Final    MICROSCOPIC FINDINGS SUGGEST THAT THIS SPECIMEN IS NOT REPRESENTATIVE OF LOWER RESPIRATORY SECRETIONS. PLEASE RECOLLECT. NOTIFIED E CASTRO 1929 11/03/14 A BROWNING    Report Status 11/03/2014 FINAL  Final  Culture, blood (routine x 2)     Status: None (Preliminary result)   Collection Time: 11/05/14  8:00 PM  Result Value Ref Range Status   Specimen Description BLOOD LEFT HAND  Final   Special Requests BOTTLES DRAWN AEROBIC ONLY 1CC  Final   Culture NO GROWTH 3 DAYS  Final   Report Status PENDING  Incomplete  Culture, blood (routine x 2)     Status: None (Preliminary result)   Collection Time: 11/05/14  8:09 PM  Result Value Ref Range Status   Specimen Description BLOOD RIGHT ARM  Final   Special Requests BOTTLES DRAWN AEROBIC AND ANAEROBIC 5CC   Final   Culture NO GROWTH 3 DAYS  Final   Report Status PENDING  Incomplete     Studies: Dg Chest 2 View  11/09/2014   CLINICAL DATA:  Cough and congestion  EXAM: CHEST  2 VIEW  COMPARISON:  November 06, 2014 and November 03, 2014  FINDINGS: There is consolidation in the posterior right base  region, slightly increased. There is mild atelectasis in the medial left base. Lungs elsewhere clear. Heart is upper normal in size with pulmonary vascularity within normal limits. Mild rightward deviation of the upper thoracic trachea may indicate thyroid enlargement. Please contact  IMPRESSION: Right base airspace consolidation, increased. Mild left base atelectasis. Stable cardiac silhouette. Mild rightward deviation of the thoracic trachea raises question of thyroid enlargement.   Electronically Signed   By: Bretta Bang III M.D.   On: 11/09/2014 09:43    Scheduled Meds: . amoxicillin-clavulanate  1 tablet Oral Q12H  .  ARIPiprazole  5 mg Oral Q breakfast  . aspirin EC  81 mg Oral Daily  . enoxaparin (LOVENOX) injection  40 mg Subcutaneous Q24H  . nystatin  5 mL Oral QID  . pantoprazole  40 mg Oral Daily  . potassium chloride SA  60 mEq Oral Daily   Continuous Infusions:   Antibiotics Given (last 72 hours)    Date/Time Action Medication Dose Rate   11/06/14 1700 Given   cefTRIAXone (ROCEPHIN) 1 g in dextrose 5 % 50 mL IVPB 1 g 100 mL/hr   11/06/14 1700 Given   azithromycin (ZITHROMAX) 500 mg in dextrose 5 % 250 mL IVPB 500 mg 250 mL/hr   11/07/14 1753 Given   cefTRIAXone (ROCEPHIN) 1 g in dextrose 5 % 50 mL IVPB 1 g 100 mL/hr   11/07/14 1753 Given   azithromycin (ZITHROMAX) 500 mg in dextrose 5 % 250 mL IVPB 500 mg 250 mL/hr   11/08/14 1355 Given   amoxicillin-clavulanate (AUGMENTIN) 875-125 MG per tablet 1 tablet 1 tablet    11/08/14 2224 Given   amoxicillin-clavulanate (AUGMENTIN) 875-125 MG per tablet 1 tablet 1 tablet    11/09/14 1050 Given   amoxicillin-clavulanate (AUGMENTIN) 875-125 MG per tablet 1 tablet 1 tablet       Active Problems:   Fever   CAP (community acquired pneumonia)   Elevated troponin   Acute renal injury    Time spent:    [facility]  [facility] Pager [phone]. If 7PM-7AM, please contact night-coverage at [email or web address],  password [facility] 11/09/2014, 12:45 PM  LOS: 6 days

## 2014-11-09 NOTE — Care Management Important Message (Signed)
Important Message  Patient Details  Name: Ricky Rodriguez MRN: [id number] Date of Birth: 20-Apr-1940   Medicare Important Message Given:  Yes-third notification given    Bernadette Hoit 11/09/2014, 12:41 PM

## 2014-11-09 NOTE — Clinical Social Work Note (Addendum)
Per MD, patient not medically stable for discharge today. Mr. Pickelsimer will discharge to [facility] when stable: 24 to 48 hours per MD.   CSW receivecd call from Bud at [facility] approx. 4:56 pm informing CSW regarding what they had learned regarding patient's insurance. Mrs. Cumpton works her insurance - [employer] is primary.  They do not have a contract with this insurance.   CSW received call from Mrs. Coopman at 5:43 pm regarding her insurance. She has contacted her insurance and been provided with a list of facilities that accept her insurance. Locally they include [facility] [address] n of [place] and [place] of [place], [address] in [place] and [address] in [place], [facility] in [place],  [facility] in [place] and [facility] in [place]. CSW will follow up with these facilities regarding patient.  Genelle Bal, MSW, LCSW Licensed Clinical Social Worker Clinical Social Work Department [employer] [phone]

## 2014-11-10 DIAGNOSIS — J690 Pneumonitis due to inhalation of food and vomit: Secondary | ICD-10-CM | POA: Diagnosis present

## 2014-11-10 LAB — GLUCOSE, CAPILLARY
GLUCOSE-CAPILLARY: 94 mg/dL (ref 65–99)
Glucose-Capillary: 94 mg/dL (ref 65–99)

## 2014-11-10 LAB — CULTURE, BLOOD (ROUTINE X 2)
CULTURE: NO GROWTH
CULTURE: NO GROWTH

## 2014-11-10 LAB — CBC
HCT: 40.6 % (ref 39.0–52.0)
Hemoglobin: 13.7 g/dL (ref 13.0–17.0)
MCH: 31.1 pg (ref 26.0–34.0)
MCHC: 33.7 g/dL (ref 30.0–36.0)
MCV: 92.3 fL (ref 78.0–100.0)
PLATELETS: 369 10*3/uL (ref 150–400)
RBC: 4.4 MIL/uL (ref 4.22–5.81)
RDW: 14 % (ref 11.5–15.5)
WBC: 11.5 10*3/uL — ABNORMAL HIGH (ref 4.0–10.5)

## 2014-11-10 MED ORDER — PIPERACILLIN-TAZOBACTAM 3.375 G IVPB
3.3750 g | Freq: Three times a day (TID) | INTRAVENOUS | Status: DC
  Administered 2014-11-10 – 2014-11-11 (×2): 3.375 g via INTRAVENOUS
  Filled 2014-11-10 (×6): qty 50

## 2014-11-10 NOTE — Progress Notes (Signed)
Physical Therapy Treatment Patient Details Name: Ricky Rodriguez MRN: [id number] DOB: Jun 02, 1940 Today's Date: 11/10/2014    History of Present Illness Patient is a 74 yo male admitted 11/03/14 with Rt facial droop, difficulty ambulating.  Patient with CAP, acute renal injury.  PMH:  Dementia, Lt THA became infected, Afib    PT Comments    Making progress with mobility, in particular with sitting balance this session; Looks good sitting up in chair; Encouraged pt and wife to stay up for lunch; Nsg to use lift for getting back to bed  Follow Up Recommendations  SNF;Supervision/Assistance - 24 hour     Equipment Recommendations  None recommended by PT    Recommendations for Other Services       Precautions / Restrictions Precautions Precautions: Fall Other Brace/Splint: LLE AFO    Mobility  Bed Mobility Overal bed mobility: Needs Assistance;+2 for physical assistance Bed Mobility: Rolling;Sidelying to Sit Rolling: Max assist;+2 for physical assistance Sidelying to sit: Max assist;+2 for physical assistance       General bed mobility comments: Verbal cues for technique.  Continued to require max assist to move to/from sitting.  Once upright, patient able to maintain upright sitting balance today with min guard assist.  Able to perform minimal weigh shifts to help with donning shoes and L AFO.  Patient sat EOB x 10 minutes.    Transfers Overall transfer level: Needs assistance Equipment used: 2 person hand held assist;Rolling walker (2 wheeled) Transfers: Sit to/from [employer] Sit to Stand: +2 safety/equipment;+2 physical assistance;From elevated surface;Mod assist Stand pivot transfers: +2 safety/equipment;+2 physical assistance;Max assist       General transfer comment: Heavy mod assist and bil knee block to work on standing; verbal and tactile cueing to initiate with anterior weight shift; Noted bil LE muscle activation in response to center of mass being  shifted over feet; significant difficulty with ant weight shift; Used RW for basic pivot to chair (the thought was perhaps that putting hands on rW would help with ant weight shift); requiring max assist for shift to recliner  Ambulation/Gait                 Stairs            Wheelchair Mobility    Modified Rankin (Stroke Patients Only)       Balance     Sitting balance-Leahy Scale: Fair Sitting balance - Comments: More upright posture, with improved static balance                            Cognition Arousal/Alertness: Awake/alert Behavior During Therapy: WFL for tasks assessed/performed;Flat affect Overall Cognitive Status: Difficult to assess Area of Impairment: Orientation;Attention;Memory;Following commands;Awareness;Problem solving   Current Attention Level: Sustained   Following Commands: Follows one step commands with increased time     Problem Solving: Slow processing;Decreased initiation;Difficulty sequencing;Requires verbal cues;Requires tactile cues General Comments: More alert and interactive today    Exercises      General Comments        Pertinent Vitals/Pain Pain Assessment: No/denies pain    Home Living                      Prior Function            PT Goals (current goals can now be found in the care plan section) Acute Rehab PT Goals Patient Stated Goal: Wife:  For patient to  be able to ambulate short distances. PT Goal Formulation: With patient/family Time For Goal Achievement: 11/20/14 Potential to Achieve Goals: Fair Progress towards PT goals: Progressing toward goals    Frequency  Min 2X/week    PT Plan Current plan remains appropriate    Co-evaluation             End of Session Equipment Utilized During Treatment: Gait belt;Other (comment) (Pt's shoes and AFO) Activity Tolerance: Patient tolerated treatment well Patient left: in chair;with call bell/phone within reach;with chair alarm  set;with family/visitor present     Time: 4098-1191 PT Time Calculation (min) (ACUTE ONLY): 30 min  Charges:  $Therapeutic Activity: 23-37 mins                    G Codes:      Olen Pel 11/10/2014, 11:26 AM  Van Clines, PT  Acute Rehabilitation Services Pager [phone] Office [phone]

## 2014-11-10 NOTE — Progress Notes (Signed)
Called to patient room by wife. Noted patient breathing heavily. Wife stated he stiffened and started  "rubbing his legs like he was hurting." Patient is responsive to voice.and will look at Clinical research associate. Usually non verbal. Patient had bit his lower lip. Bleeding minimal. Vitals taken  BP elevated will update MD.

## 2014-11-10 NOTE — Clinical Social Work Note (Signed)
Patient will discharge to [facility] when medically stable. Updated clinicals forwarded to facility today.  Genelle Bal, MSW, LCSW Licensed Clinical Social Worker Clinical Social Work Department [employer] [phone]

## 2014-11-10 NOTE — Progress Notes (Signed)
ANTIBIOTIC CONSULT NOTE - INITIAL  Pharmacy Consult for Zosyn Indication: Aspiration PNA  No Known Allergies  Patient Measurements: Height:  (177.8 cm) Weight: 175 lb 11.3 oz (79.7 kg) IBW/kg (Calculated) : 73  Vital Signs: Temp: 98 F (36.7 C) (09/13 0500) Temp Source: Oral (09/13 0500) BP: 130/81 mmHg (09/13 0500) Pulse Rate: 80 (09/13 0500) Intake/Output from previous day: 09/12 0701 - 09/13 0700 In: 120 [P.O.:120] Out: 1451 [Urine:1450; Stool:1] Intake/Output from this shift:    Labs:  Recent Labs  11/08/14 0605 11/09/14 0442  WBC 13.4* 12.4*  HGB 12.4* 12.5*  PLT 230 282  CREATININE 0.96 0.91   Estimated Creatinine Clearance: 73.5 mL/min (by C-G formula based on Cr of 0.91). No results for input(s): VANCOTROUGH, VANCOPEAK, VANCORANDOM, GENTTROUGH, GENTPEAK, GENTRANDOM, TOBRATROUGH, TOBRAPEAK, TOBRARND, AMIKACINPEAK, AMIKACINTROU, AMIKACIN in the last 72 hours.   Microbiology: Recent Results (from the past 720 hour(s))  Culture, blood (routine x 2)     Status: None   Collection Time: 11/03/14 10:20 AM  Result Value Ref Range Status   Specimen Description BLOOD RIGHT ANTECUBITAL  Final   Special Requests BOTTLES DRAWN AEROBIC AND ANAEROBIC 5CC  Final   Culture NO GROWTH 5 DAYS  Final   Report Status 11/08/2014 FINAL  Final  Culture, blood (routine x 2)     Status: None   Collection Time: 11/03/14 10:50 AM  Result Value Ref Range Status   Specimen Description BLOOD LEFT ANTECUBITAL  Final   Special Requests BOTTLES DRAWN AEROBIC AND ANAEROBIC 5CC  Final   Culture NO GROWTH 5 DAYS  Final   Report Status 11/08/2014 FINAL  Final  Urine culture     Status: None   Collection Time: 11/03/14 11:13 AM  Result Value Ref Range Status   Specimen Description URINE, CATHETERIZED  Final   Special Requests NONE  Final   Culture NO GROWTH 1 DAY  Final   Report Status 11/04/2014 FINAL  Final  Culture, sputum-assessment     Status: None   Collection Time: 11/03/14   5:24 PM  Result Value Ref Range Status   Specimen Description SPUTUM  Final   Special Requests Normal  Final   Sputum evaluation   Final    MICROSCOPIC FINDINGS SUGGEST THAT THIS SPECIMEN IS NOT REPRESENTATIVE OF LOWER RESPIRATORY SECRETIONS. PLEASE RECOLLECT. NOTIFIED E CASTRO 1929 11/03/14 A BROWNING    Report Status 11/03/2014 FINAL  Final  Culture, blood (routine x 2)     Status: None (Preliminary result)   Collection Time: 11/05/14  8:00 PM  Result Value Ref Range Status   Specimen Description BLOOD LEFT HAND  Final   Special Requests BOTTLES DRAWN AEROBIC ONLY 1CC  Final   Culture NO GROWTH 4 DAYS  Final   Report Status PENDING  Incomplete  Culture, blood (routine x 2)     Status: None (Preliminary result)   Collection Time: 11/05/14  8:09 PM  Result Value Ref Range Status   Specimen Description BLOOD RIGHT ARM  Final   Special Requests BOTTLES DRAWN AEROBIC AND ANAEROBIC 5CC   Final   Culture NO GROWTH 4 DAYS  Final   Report Status PENDING  Incomplete    Medical History: Past Medical History  Diagnosis Date  . Dementia   . History of blood clots   . Acid reflux   . A-fib   . Shortness of breath     Assessment: Ricky Rodriguez to transition from Augmentin to Zosyn today for concerns of continued aspiration and  worsening of PNA. SCr 0.91, CrCl~70-80 ml/min.  Goal of Therapy:  Proper antibiotics for infection/cultures adjusted for renal/hepatic function   Plan:  1. Zosyn 3.375g IV every 8 hours (infused over 4 hours) 2. Pharmacy will sign off as no further dose adjustments expected at this time.   Georgina Pillion, PharmD, BCPS Clinical Pharmacist Pager: [phone] 11/10/2014 11:38 AM

## 2014-11-10 NOTE — Progress Notes (Addendum)
[facility] PROGRESS NOTE  Ricky Rodriguez ZOX:[id number] DOB: 1940-10-23 DOA: 11/03/2014 PCP: Charolett Bumpers, MD   Narrative: Ricky Rodriguez is a 74 y.o. 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 He has progressed very slowly, had fevers/continued aspiration for prolonged duration  is followed by SLP, DIet downgraded but unfortunately continued to aspirate, PEG not an option, now Palliative consulted and eval pending  Assessment/Plan: 1. Aspiration pneumonia -was on Rocephin/Zithromax, changed to Augmentin 9/11 -was febrile for 4days, now finally afebrile -blood cx-NGTD -persistent and worsening cough, and aspiration after liquids, CXR 9/12 with worsening pneumonia -Stop Augmentin and start Zosyn -Speech following and was on Regular diet with thick liquids, this was changed to D3 diet with nectar thick liquids 9/12 -suspect ongoing aspiration given increasing cough esp after liquids (thickened) -Discussed with wife risk of ongoing Aspiration, She agreed that PEG not on option, she is agreeable to Palliative consult  2.  Dementia/Depression -intermittent episodes of confusion, continue abilify  3. Elevated troponin -? Demand from fever/sepsis, no chest pain,EKG non specific -ECHO poor quality but normal EF and no wall motion abnormalities -troponin's trending down  4. AKI -improved with hydration, cut down IVf  5. 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 Debility/Chronic L leg issues -Pt eval completed, SNF recommended  7. Hypokalemia -due to HCTZ, replaced  DVT proph: lovenox  Code Status: DNR Family Communication: wife at bedside Disposition Plan: SNF ? Monday  if stable     HPI/Subjective: Continues to have worsening cough, esp with liquids, more confused this am, refused to eat  Objective: Filed Vitals:   11/10/14 0500  BP: 130/81  Pulse: 80  Temp: 98 F (36.7 C)  Resp: 18    Intake/Output Summary (Last 24 hours) at 11/10/14 0936 Last data filed at 11/10/14 0500  Gross per 24 hour  Intake    120 ml  Output   1451 ml  Net  -1331 ml   Filed Weights   11/07/14 2125 11/08/14 2114 11/09/14 2047  Weight: 80 kg (176 lb 5.9 oz) 79.4 kg (175 lb 0.7 oz) 79.7 kg (175 lb 11.3 oz)    Exam:   General:  Alert, awake, oriented to self only, angry  Cardiovascular: S1S2/RRR  Respiratory: diffuse ronchi  Abdomen: soft, NT, BS present  Musculoskeletal: no edema c/c  Data Reviewed: Basic Metabolic Panel:  Recent Labs Lab 11/05/14 0524 11/06/14 1211 11/07/14 0552 11/08/14 0605 11/09/14 0442  NA 141 135 140 137 133*  K 3.1* 3.7 3.7 3.8 4.1  CL 109 107 110 109 105  CO2 21* 21* 21* 21* 21*  GLUCOSE 113* 111* 100* 106* 102*  BUN 22* 18 15 14 11   CREATININE 1.05 0.90 0.80 0.96 0.91  CALCIUM 7.6* 7.7* 7.8* 7.7* 7.6*   Liver Function Tests:  Recent Labs Lab 11/03/14 1050 11/05/14 0524  AST 101* 38  ALT 87* 62  ALKPHOS 97 69  BILITOT 1.1 1.0  PROT 6.7 5.2*  ALBUMIN 3.5 2.6*   No results for input(s): LIPASE, AMYLASE in the last 168 hours. No results for input(s): AMMONIA in the last 168 hours. CBC:  Recent Labs Lab 11/03/14 1050  11/05/14 0524 11/06/14 1211 11/07/14 0552 11/08/14 0605 11/09/14 0442  WBC 19.6*  < > 16.1* 15.8* 13.2* 13.4* 12.4*  NEUTROABS 16.8*  --   --   --   --   --   --   HGB 16.2  < > 14.0 13.4 12.4* 12.4* 12.5*  HCT 48.7  < > 42.7 39.4 37.0* 36.6* 37.7*  MCV 94.9  < > 94.5 92.3 92.7 92.7 93.5  PLT 214  < > 158 160 176 230 282  < > = values in this interval not displayed. Cardiac Enzymes:  Recent Labs Lab 11/03/14 1050 11/03/14 1645 11/04/14 1010 11/04/14 1630  TROPONINI 0.26*  0.21* 0.16* 0.15*   BNP (last 3 results) No results for input(s): BNP in the last 8760 hours.  ProBNP (last 3 results) No results for input(s): PROBNP in the last 8760 hours.  CBG:  Recent Labs Lab 11/08/14 2115 11/09/14 0744 11/09/14 1133 11/09/14 1638 11/09/14 2017  GLUCAP 115* 100* 116* 102* 145*    Recent Results (from the past 240 hour(s))  Culture, blood (routine x 2)     Status: None   Collection Time: 11/03/14 10:20 AM  Result Value Ref Range Status   Specimen Description BLOOD RIGHT ANTECUBITAL  Final   Special Requests BOTTLES DRAWN AEROBIC AND ANAEROBIC 5CC  Final   Culture NO GROWTH 5 DAYS  Final   Report Status 11/08/2014 FINAL  Final  Culture, blood (routine x 2)     Status: None   Collection Time: 11/03/14 10:50 AM  Result Value Ref Range Status   Specimen Description BLOOD LEFT ANTECUBITAL  Final   Special Requests BOTTLES DRAWN AEROBIC AND ANAEROBIC 5CC  Final   Culture NO GROWTH 5 DAYS  Final   Report Status 11/08/2014 FINAL  Final  Urine culture     Status: None   Collection Time: 11/03/14 11:13 AM  Result Value Ref Range Status   Specimen Description URINE, CATHETERIZED  Final   Special Requests NONE  Final   Culture NO GROWTH 1 DAY  Final   Report Status 11/04/2014 FINAL  Final  Culture, sputum-assessment     Status: None   Collection Time: 11/03/14  5:24 PM  Result Value Ref Range Status   Specimen Description SPUTUM  Final   Special Requests Normal  Final   Sputum evaluation   Final    MICROSCOPIC FINDINGS SUGGEST THAT THIS SPECIMEN IS NOT REPRESENTATIVE OF LOWER RESPIRATORY SECRETIONS. PLEASE RECOLLECT. NOTIFIED E CASTRO 1929 11/03/14 A BROWNING    Report Status 11/03/2014 FINAL  Final  Culture, blood (routine x 2)     Status: None (Preliminary result)   Collection Time: 11/05/14  8:00 PM  Result Value Ref Range Status   Specimen Description BLOOD LEFT HAND  Final   Special Requests BOTTLES DRAWN AEROBIC ONLY 1CC  Final   Culture NO  GROWTH 4 DAYS  Final   Report Status PENDING  Incomplete  Culture, blood (routine x 2)     Status: None (Preliminary result)   Collection Time: 11/05/14  8:09 PM  Result Value Ref Range Status   Specimen Description BLOOD RIGHT ARM  Final   Special Requests BOTTLES DRAWN AEROBIC AND ANAEROBIC 5CC   Final   Culture NO GROWTH 4 DAYS  Final   Report Status PENDING  Incomplete     Studies: Dg Chest 2 View  11/09/2014   CLINICAL DATA:  Cough and congestion  EXAM: CHEST  2 VIEW  COMPARISON:  November 06, 2014 and November 03, 2014  FINDINGS: There is consolidation in the posterior right base region, slightly increased. There is mild atelectasis  in the medial left base. Lungs elsewhere clear. Heart is upper normal in size with pulmonary vascularity within normal limits. Mild rightward deviation of the upper thoracic trachea may indicate thyroid enlargement. Please contact  IMPRESSION: Right base airspace consolidation, increased. Mild left base atelectasis. Stable cardiac silhouette. Mild rightward deviation of the thoracic trachea raises question of thyroid enlargement.   Electronically Signed   By: Bretta Bang III M.D.   On: 11/09/2014 09:43    Scheduled Meds: . ARIPiprazole  5 mg Oral Q breakfast  . aspirin EC  81 mg Oral Daily  . enoxaparin (LOVENOX) injection  40 mg Subcutaneous Q24H  . nystatin  5 mL Oral QID  . pantoprazole  40 mg Oral Daily  . potassium chloride SA  60 mEq Oral Daily   Continuous Infusions:   Antibiotics Given (last 72 hours)    Date/Time Action Medication Dose Rate   11/07/14 1753 Given   cefTRIAXone (ROCEPHIN) 1 g in dextrose 5 % 50 mL IVPB 1 g 100 mL/hr   11/07/14 1753 Given   azithromycin (ZITHROMAX) 500 mg in dextrose 5 % 250 mL IVPB 500 mg 250 mL/hr   11/08/14 1355 Given   amoxicillin-clavulanate (AUGMENTIN) 875-125 MG per tablet 1 tablet 1 tablet    11/08/14 2224 Given   amoxicillin-clavulanate (AUGMENTIN) 875-125 MG per tablet 1 tablet 1 tablet     11/09/14 1050 Given   amoxicillin-clavulanate (AUGMENTIN) 875-125 MG per tablet 1 tablet 1 tablet    11/09/14 2107 Given   amoxicillin-clavulanate (AUGMENTIN) 875-125 MG per tablet 1 tablet 1 tablet       Active Problems:   Fever   CAP (community acquired pneumonia)   Elevated troponin   Acute renal injury    Time spent:    [facility]  [facility] Pager [phone]. If 7PM-7AM, please contact night-coverage at [email or web address], password [email or web address] 11/10/2014, 9:36 AM  LOS: 7 days

## 2014-11-10 NOTE — Progress Notes (Signed)
Estimated seizure activity is less than 30 seconds by the time she called Clinical research associate and got in the the room.

## 2014-11-11 ENCOUNTER — Encounter (HOSPITAL_COMMUNITY): Payer: Self-pay

## 2014-11-11 ENCOUNTER — Inpatient Hospital Stay (HOSPITAL_COMMUNITY): Payer: Managed Care, Other (non HMO)

## 2014-11-11 DIAGNOSIS — Z515 Encounter for palliative care: Secondary | ICD-10-CM

## 2014-11-11 DIAGNOSIS — J690 Pneumonitis due to inhalation of food and vomit: Secondary | ICD-10-CM

## 2014-11-11 DIAGNOSIS — Z7189 Other specified counseling: Secondary | ICD-10-CM

## 2014-11-11 LAB — CBC
HEMATOCRIT: 37.8 % — AB (ref 39.0–52.0)
Hemoglobin: 12.7 g/dL — ABNORMAL LOW (ref 13.0–17.0)
MCH: 31 pg (ref 26.0–34.0)
MCHC: 33.6 g/dL (ref 30.0–36.0)
MCV: 92.2 fL (ref 78.0–100.0)
PLATELETS: 385 10*3/uL (ref 150–400)
RBC: 4.1 MIL/uL — ABNORMAL LOW (ref 4.22–5.81)
RDW: 14 % (ref 11.5–15.5)
WBC: 11.8 10*3/uL — ABNORMAL HIGH (ref 4.0–10.5)

## 2014-11-11 LAB — BASIC METABOLIC PANEL
Anion gap: 8 (ref 5–15)
BUN: 10 mg/dL (ref 6–20)
CHLORIDE: 104 mmol/L (ref 101–111)
CO2: 22 mmol/L (ref 22–32)
CREATININE: 0.9 mg/dL (ref 0.61–1.24)
Calcium: 8 mg/dL — ABNORMAL LOW (ref 8.9–10.3)
GFR calc Af Amer: >60 mL/min (ref >60)
GFR calc non Af Amer: >60 mL/min (ref >60)
Glucose, Bld: 116 mg/dL — ABNORMAL HIGH (ref 65–99)
Potassium: 4 mmol/L (ref 3.5–5.1)
SODIUM: 134 mmol/L — AB (ref 135–145)

## 2014-11-11 MED ORDER — SODIUM CHLORIDE 0.9 % IV SOLN
500.0000 mg | Freq: Two times a day (BID) | INTRAVENOUS | Status: DC
  Filled 2014-11-11 (×2): qty 5

## 2014-11-11 MED ORDER — SODIUM CHLORIDE 0.9 % IV SOLN
1000.0000 mg | Freq: Once | INTRAVENOUS | Status: AC
  Administered 2014-11-11: 1000 mg via INTRAVENOUS
  Filled 2014-11-11: qty 10

## 2014-11-11 MED ORDER — WHITE PETROLATUM GEL
Status: AC
  Administered 2014-11-11: 02:00:00
  Filled 2014-11-11: qty 1

## 2014-11-11 MED ORDER — HYDROCOD POLST-CPM POLST ER 10-8 MG/5ML PO SUER: 5.0000 mL | Freq: Two times a day (BID) | ORAL | Status: AC | PRN

## 2014-11-11 MED ORDER — LORAZEPAM 2 MG/ML IJ SOLN: 1.0000 mg | INTRAMUSCULAR | Status: DC | PRN

## 2014-11-11 MED ORDER — ALBUTEROL SULFATE (2.5 MG/3ML) 0.083% IN NEBU: 2.5000 mg | INHALATION_SOLUTION | RESPIRATORY_TRACT | Status: AC | PRN

## 2014-11-11 MED ORDER — LORAZEPAM 2 MG/ML PO CONC: 1.0000 mg | Freq: Four times a day (QID) | ORAL | Status: AC | PRN

## 2014-11-11 MED ORDER — LEVETIRACETAM 500 MG PO TABS: 500.0000 mg | ORAL_TABLET | Freq: Two times a day (BID) | ORAL | Status: AC

## 2014-11-11 MED ORDER — LORAZEPAM 2 MG/ML IJ SOLN
1.0000 mg | Freq: Once | INTRAMUSCULAR | Status: AC
  Administered 2014-11-11: 1 mg via INTRAVENOUS
  Filled 2014-11-11: qty 1

## 2014-11-11 MED ORDER — POTASSIUM CHLORIDE CRYS ER 20 MEQ PO TBCR: 60.0000 meq | EXTENDED_RELEASE_TABLET | Freq: Every day | ORAL | Status: AC

## 2014-11-11 NOTE — Progress Notes (Signed)
Called to patient room by wife . Noted patient on his post ictal phase. BP 147/90 . Sat 96% on room air. Will update MD. "Not as strong as before."  as wife stated. Will continue to monitor.

## 2014-11-11 NOTE — Progress Notes (Signed)
Shift event note:  Notified by RN at approx 2230 regarding episode noted by wife that appeared as if pt was stiffened and was rubbing his legs. This episode lasted approx 30 seconds. By the time the RN arrived to the room the episode was over and pt appeared back to baseline, though it was noted pt had possibly bit his bottom (R) lip as there was a small abrasion noted to the area. Unclear if there was an actual post-ictal event. VS remained stable and pt was afebrile. Notified regarding a second event that was similar to the first but lasted only 10 seconds. RN reports this episode followed by a clear post-ictal phase. At bedside pt noted alert and tracking. He is non-verbal but this is his baseline. PEAR[facility], Appears to move upper extremities at baseline but will not follow commands which is also baseline for pt. Small abrasion noted to (R) lower lip w/o active bleeding. Ct head w/o cm pending. Assessment/ Plan: 1. New onset seizures: Etiology unclear. Ct head is w/o acute findings. Curbside consult w/ Dr Amada Jupiter of neurology service who recommended Keppra load of 1 Gm IV x 1 then 500 mg BID IV. Spoke to pt's wife at bedside regarding plan for ct and need to consider desire (or not) to pursue aggressive management which might involve neurology consult, further imaging, etc. Wife reports there is to be a meeting later today with the palliative care team to discuss goals of care and that it is unlikely she would want to pursue aggressive investigation/management of new onset seizures. Will start Keppra per neurology recommendation. Will add PRN Ativan for seizure activity and continue to monitor closely on telemetry. Discussed pt w/ Dr Allena Katz who is in agreement w/ plan.  Leanne Chang, NP-C [facility] Pager [phone]

## 2014-11-11 NOTE — Discharge Summary (Addendum)
Ricky Rodriguez, is a 74 y.o. male  DOB Dec 25, 1940  MRN [id number].  Admission date:  11/03/2014  Admitting Physician  Zannie Cove, MD  Discharge Date:  11/11/2014   Primary MD  Charolett Bumpers, MD  Recommendations for primary care physician for things to follow:  - Patient to continue dysphagia 3 diet with nectar thick liquid - Please consult palliative care service through hospice and palliative care of [facility]. Palliative care to follow a skilled nursing facility for management of symptoms.  Patient is DO NOT RESUSCITATE Admission Diagnosis  Fever, unspecified fever cause [R50.9] Altered mental status, unspecified altered mental status type [R41.82]   Discharge Diagnosis  Fever, unspecified fever cause [R50.9] Altered mental status, unspecified altered mental status type [R41.82]    Active Problems:   Fever   CAP (community acquired pneumonia)   Elevated troponin   Acute renal injury   Aspiration pneumonia      Past Medical History  Diagnosis Date  . Dementia   . History of blood clots   . Acid reflux   . A-fib   . Shortness of breath     Past Surgical History  Procedure Laterality Date  . Hip surgery  05/22/11 last    L hip total replacement x 4   . Knee surgery    . Tonsillectomy    . Appendectomy    . Hernia repair    . Wrist surgery    . Incision and drainage hip  09/20/2011    Procedure: IRRIGATION AND DEBRIDEMENT HIP WITH POLY EXCHANGE;  Surgeon: Eldred Manges, MD;  Location: [facility] OR;  Service: Orthopedics;  Laterality: Left;       History of present illness and  Hospital Course:     Kindly see H&P for history of present illness and admission details, please review complete Labs, Consult reports and Test reports for all details in brief  HPI  from the history and physical done on the day of admission Ricky Rodriguez is a 74 y.o. 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 Patient was brought to the hospital via EMS as his wife noted right-sided facial droop and difficulty ambulation. The emergency department, the patient did not demonstrate any focal weakness, but was found to have a fever. Due to the patient's dementia, and history is obtained by his wife as well as from the chart. His wife notes that he has not been following commands this morning and has not been able to walk with assistance, which he normally does. There are no palliating or provoking factors. His symptoms are slightly improved per his wife. Additionally, she notes slight cough, which she attributes to him clearing his throat. She denies decreased appetite, fevers, chills, nausea, difficulty breathing.    Hospital Course  Ricky Rodriguez is a 74 y.o. 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 He has progressed very slowly, had fevers/continued aspiration for prolonged duration is followed by SLP, DIet downgraded but unfortunately continued to aspirate, PEG not an option, seen by palliative care, decision was made by wife not to follow aggressive options, no PEG tube, current plan discharge to SNF, with transition to palliative/hospice care if patient continues to deteriorate.  Aspiration pneumonia -was on Rocephin/Zithromax, changed to Augmentin 9/11 back to Zosyn on 9/14, palliative care discussed with wife, at this point wife understand this is irreversible, progressive condition, with the plan is patient comfort with no aggressive workup or intervention, no further antibiotics on discharge -  Afebrile, blood cx-NGTD -persistent and worsening cough, and aspiration after liquids, CXR 9/12 with worsening pneumonia -Seen by speech, was on Regular diet with thick  liquids, this was changed to D3 diet with nectar thick liquids 9/12 -Discussed with wife risk of ongoing Aspiration, She agreed that PEG not on option, Palliative consult appreciated,  plan is to discharge to SNF, palliative will follow [place] an outpatient, if patient continues to deteriorate can transfer to hospice versus peak in place.   Dementia/Depression -intermittent episodes of confusion, continue abilify  Seizures - Patient had a brief episode of seizure overnight, CT head with no acute abnormalities ,discussed with wife, at this point she does not wish for any aggressive workup, no EEG, normal MRI, the plan is to continue with Keppra 500 mg oral twice a day.   Elevated troponin -  due to  Demand  ischemia from fever/sepsis, no chest pain,EKG non specific -ECHO poor quality but normal EF and no wall motion abnormalities -troponin's trending down  AKI -improved with hydration.  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   Follow UP      Discharge Instructions  and  Discharge Medications         Discharge Instructions    Discharge instructions    Complete by:  As directed   Patient to continue dysphagia 3 diet with nectar thick liquid - Please consult palliative care service through hospice and palliative care of [facility].            Medication List    STOP taking these medications        aspirin EC 81 MG tablet     hydrochlorothiazide 50 MG tablet  Commonly known as:  HYDRODIURIL     potassium chloride 8 MEQ tablet  Commonly known as:  KLOR-CON  Replaced by:  potassium chloride SA 20 MEQ tablet      TAKE these medications          acetaminophen 500 MG tablet  Commonly known as:  TYLENOL  Take 500 mg by mouth every 6 (six) hours as needed for mild pain, moderate pain or headache.     albuterol (2.5 MG/3ML) 0.083% nebulizer solution  Commonly known as:  PROVENTIL  Take 3 mLs (2.5 mg total) by nebulization every 4 (four) hours as needed for wheezing or shortness of breath.     ARIPiprazole 5 MG tablet  Commonly known as:  ABILIFY  Take 5 mg by mouth daily.     cephALEXin 500 MG capsule  Commonly known as:  KEFLEX  take 1 capsule by mouth  three times a day     chlorpheniramine-HYDROcodone 10-8 MG/5ML Suer  Commonly known as:  TUSSIONEX  Take 5 mLs by mouth every 12 (twelve) hours as needed for cough.     ibuprofen 200 MG tablet  Commonly known as:  ADVIL,MOTRIN  Take 200 mg by mouth every 6 (six) hours as needed for headache, mild pain or moderate pain.     lansoprazole 30 MG capsule  Commonly known as:  PREVACID  Take 30 mg by mouth every morning.     levETIRAcetam 500 MG tablet  Commonly known as:  KEPPRA  Take 1 tablet (500 mg total) by mouth 2 (two) times daily.     LORazepam 2 MG/ML concentrated solution  Commonly known as:  ATIVAN  Take 0.5 mLs (1 mg total) by mouth every 6 (six) hours as needed for anxiety or seizure (dyspnea).     multivitamin Tabs tablet  Take 1 tablet by mouth daily.     potassium chloride SA 20 MEQ tablet  Commonly known as:  K-DUR,KLOR-CON  Take 3 tablets (60 mEq total) by mouth daily.          Diet and Activity recommendation: See Discharge Instructions above   Consults obtained - Palliative care   Major procedures and Radiology Reports - PLEASE review detailed and final reports for all details, in brief -      Dg Chest 2 View  11/09/2014   CLINICAL DATA:  Cough and congestion  EXAM: CHEST  2 VIEW  COMPARISON:  November 06, 2014 and November 03, 2014  FINDINGS: There is consolidation in the posterior right base region, slightly increased. There is mild  atelectasis in the medial left base. Lungs elsewhere clear. Heart is upper normal in size with pulmonary vascularity within normal limits. Mild rightward deviation of the upper thoracic trachea may indicate thyroid enlargement. Please contact  IMPRESSION: Right base airspace consolidation, increased. Mild left base atelectasis. Stable cardiac silhouette. Mild rightward deviation of the thoracic trachea raises question of thyroid enlargement.   Electronically Signed   By: Bretta Bang III M.D.   On: 11/09/2014 09:43   Dg Chest 2 View  11/06/2014   CLINICAL DATA:  Fever.  EXAM: CHEST  2 VIEW  COMPARISON:  11/02/2014  FINDINGS: Patient is rotated towards the right. Heart size is enlarged. There is patchy density seen in the lower lobe region on the lateral view consistent with infectious infiltrate, likely at the left lung base but difficult to visualize on the frontal view. Mild pulmonary vascular congestion.  IMPRESSION: 1. Cardiomegaly and vascular congestion. 2. Lower lobe infiltrate, likely at the left lung base.   Electronically Signed   By: Norva Pavlov M.D.   On: 11/06/2014 09:09   Dg Chest 2 View  11/03/2014   CLINICAL DATA:  Cough, fever and lethargy.  EXAM: CHEST  2 VIEW  COMPARISON:  05/27/2014  FINDINGS: Cardiomediastinal silhouette is enlarged. Mediastinal contours appear intact.  There is no evidence of pleural effusion or pneumothorax. Lung volumes low. There is patchy airspace consolidation of the left lower lobe. Streaky airspace opacities are seen within the right infrahilar region.  Osseous structures are without acute abnormality. Soft tissues are grossly normal.  IMPRESSION: Enlarged cardiac silhouette.  Left lower lobe patchy airspace consolidation.  Right middle/ lower lobe streaky opacities. These may represent areas of scarring, however underlying pulmonary nodules cannot be excluded. CT of the chest may be considered if found clinically necessary.   Electronically Signed   By:  Ted Mcalpine M.D.   On: 11/03/2014 12:18   Ct Head Wo Contrast  11/11/2014   CLINICAL DATA:  74 year old male with seizure  EXAM: CT HEAD WITHOUT CONTRAST  TECHNIQUE: Contiguous axial images were obtained from the base of the skull through the vertex without intravenous contrast.  COMPARISON:  Head CT dated 11/03/2014  FINDINGS: The ventricles are dilated and the sulci are prominent compatible with age-related atrophy. Periventricular and deep white matter hypodensities represent chronic microvascular ischemic changes. There is no intracranial hemorrhage. No mass effect or midline shift identified.  There is mild mucoperiosteal thickening of paranasal sinuses with partial opacification of the right sphenoid sinus. The mastoid air cells are clear. The calvarium is intact.  IMPRESSION: No acute intracranial pathology.  Stable age-related atrophy and chronic microvascular ischemic disease.  If symptoms persist and there are no contraindications, MRI may provide better evaluation if clinically indicated.   Electronically Signed   By: Elgie Collard M.D.   On: 11/11/2014 03:42   Ct Head Wo Contrast  11/03/2014   CLINICAL DATA:  Altered mental status  EXAM: CT HEAD WITHOUT CONTRAST  TECHNIQUE: Contiguous axial images were obtained from the base of the skull through the vertex without intravenous contrast.  COMPARISON:  09/24/2011  FINDINGS: No skull fracture is noted. Paranasal sinuses and mastoid air cells are unremarkable. No intracranial hemorrhage, mass effect or midline shift. Stable atrophy and chronic white matter small vessel disease. No acute cortical infarction. No mass lesion is noted on this unenhanced scan. Bilateral basal ganglia punctate calcifications are again noted.  IMPRESSION: No acute intracranial abnormality. Stable atrophy and chronic white matter disease.   Electronically Signed   By: Natasha Mead M.D.   On: 11/03/2014 12:26   Ct Hip Left W Contrast  11/06/2014   CLINICAL DATA:   Fever.  Elevated white blood cell count.  EXAM: CT OF THE LEFT HIP WITH CONTRAST  TECHNIQUE: Multidetector CT imaging was performed following the standard protocol during bolus administration of intravenous contrast.  CONTRAST:  OMNIPAQUE IOHEXOL 300 MG/ML  SOLN  COMPARISON:  Radiographs 09/24/2011.  FINDINGS: Modular LEFT hip arthroplasty is present. There is lucency along the inferior margin of the acetabular cup however there is good bony ingrowth around the superior margin of the acetabular cup. The lucency around the inferior cup is of questionable significance. No loosening of the femoral component is identified.  There is no soft tissue abscess identified. Calcification is present anterior to the LEFT acetabulum which probably lies within the LEFT iliopsoas tendon. Visceral pelvis grossly appears within normal limits. Large stool burden is present in the rectum. No LEFT inguinal adenopathy. Atherosclerosis. No acute vascular abnormality. LEFT sacroiliac joint osteoarthritis. No areas of osteolysis are present to suggest osteomyelitis. Chronic deformity of the LEFT acetabulum is present. The acetabular cup is located posteriorly to the normal position, likely secondary to revision or trauma. Atrophy of the iliacus muscle is probably secondary to disuse. Calcific tendinitis of the LEFT common hamstring origin.  IMPRESSION: 1. No abscess or acute abnormality. 2. No current complicating features of modular LEFT hip arthroplasty.   Electronically Signed   By: Andreas Newport M.D.   On: 11/06/2014 19:03    Micro Results     Recent Results (from the past 240 hour(s))  Culture, blood (routine x 2)     Status: None   Collection Time: 11/03/14 10:20 AM  Result Value Ref Range Status   Specimen Description BLOOD RIGHT ANTECUBITAL  Final  Special Requests BOTTLES DRAWN AEROBIC AND ANAEROBIC 5CC  Final   Culture NO GROWTH 5 DAYS  Final   Report Status 11/08/2014 FINAL  Final  Culture, blood (routine  x 2)     Status: None   Collection Time: 11/03/14 10:50 AM  Result Value Ref Range Status   Specimen Description BLOOD LEFT ANTECUBITAL  Final   Special Requests BOTTLES DRAWN AEROBIC AND ANAEROBIC 5CC  Final   Culture NO GROWTH 5 DAYS  Final   Report Status 11/08/2014 FINAL  Final  Urine culture     Status: None   Collection Time: 11/03/14 11:13 AM  Result Value Ref Range Status   Specimen Description URINE, CATHETERIZED  Final   Special Requests NONE  Final   Culture NO GROWTH 1 DAY  Final   Report Status 11/04/2014 FINAL  Final  Culture, sputum-assessment     Status: None   Collection Time: 11/03/14  5:24 PM  Result Value Ref Range Status   Specimen Description SPUTUM  Final   Special Requests Normal  Final   Sputum evaluation   Final    MICROSCOPIC FINDINGS SUGGEST THAT THIS SPECIMEN IS NOT REPRESENTATIVE OF LOWER RESPIRATORY SECRETIONS. PLEASE RECOLLECT. NOTIFIED E CASTRO 1929 11/03/14 A BROWNING    Report Status 11/03/2014 FINAL  Final  Culture, blood (routine x 2)     Status: None   Collection Time: 11/05/14  8:00 PM  Result Value Ref Range Status   Specimen Description BLOOD LEFT HAND  Final   Special Requests BOTTLES DRAWN AEROBIC ONLY 1CC  Final   Culture NO GROWTH 5 DAYS  Final   Report Status 11/10/2014 FINAL  Final  Culture, blood (routine x 2)     Status: None   Collection Time: 11/05/14  8:09 PM  Result Value Ref Range Status   Specimen Description BLOOD RIGHT ARM  Final   Special Requests BOTTLES DRAWN AEROBIC AND ANAEROBIC 5CC   Final   Culture NO GROWTH 5 DAYS  Final   Report Status 11/10/2014 FINAL  Final       Today   Subjective:   Ricky Rodriguez  is confused, hard of hearing. Objective:   Blood pressure 115/80, pulse 86, temperature 98 F (36.7 C), temperature source Oral, resp. rate 20, height 5\' 10"  (1.778 m), weight 84.46 kg (186 lb 3.2 oz), SpO2 97 %.   Intake/Output Summary (Last 24 hours) at 11/11/14 1430 Last data filed at 11/11/14  1120  Gross per 24 hour  Intake    260 ml  Output    925 ml  Net   -665 ml    Exam  General: Alert, awake, oriented to self only,   Cardiovascular: S1S2/RRR  Respiratory: diffuse ronchi  Abdomen: soft, NT, BS present  Musculoskeletal: no edema c/c  Data Review   CBC w Diff:  Lab Results  Component Value Date   WBC 11.8* 11/11/2014   HGB 12.7* 11/11/2014   HCT 37.8* 11/11/2014   PLT 385 11/11/2014   LYMPHOPCT 4* 11/03/2014   BANDSPCT 0 09/16/2011   MONOPCT 10 11/03/2014   EOSPCT 0 11/03/2014   BASOPCT 0 11/03/2014    CMP:  Lab Results  Component Value Date   NA 134* 11/11/2014   K 4.0 11/11/2014   CL 104 11/11/2014   CO2 22 11/11/2014   BUN 10 11/11/2014   CREATININE 0.90 11/11/2014   CREATININE 1.00 10/12/2014   PROT 5.2* 11/05/2014   ALBUMIN 2.6* 11/05/2014   BILITOT 1.0 11/05/2014  ALKPHOS 69 11/05/2014   AST 38 11/05/2014   ALT 62 11/05/2014  .   Total Time in preparing paper work, data evaluation and todays exam - 35 minutes  Shandel Busic M.D on 11/11/2014 at 2:30 PM  [facility]   Office  [phone]

## 2014-11-11 NOTE — Progress Notes (Signed)
Speech Language Pathology Treatment: Dysphagia  Patient Details Name: Ricky Rodriguez MRN: [id number] DOB: 06/15/1940 Today's Date: 11/11/2014 Time: 4098-1191 SLP Time Calculation (min) (ACUTE ONLY): 15 min  Assessment / Plan / Recommendation Clinical Impression  Pt demonstrated tolerance of nectar thick liquid and pudding texture administered by wife without s/sx of aspiration. SLP educated and discussed diet and safe feeding recommendations with pt and pt's wife. Pt is being followed by pallative care and will be discharged from ST due to completed family education. SLP recommends continuing nectar thick liquids and regular solids given pt's family and wife are choosing softer foods to allow for a greater variety of choices and comfort quality.   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 Patient was brought to the hospital via EMS as his wife noted right-sided facial droop and difficulty ambulation. The emergency department, the patient did not demonstrate any focal weakness, but was found to have a fever. Due to the patient's dementia, and history is obtained by his wife as well as from the chart. His wife notes that he has not been following commands this morning and has not been able to walk with assistance, which he normally does.  Chest x-ray revealed left lower lobe patchy airspace consolidation and right middle/ lower lobe streaky opacities, which may represent areas of scarring. BSE    Pertinent Vitals Pain Assessment: Faces Faces Pain Scale: No hurt  SLP Plan  Discharge SLP treatment due to (comment) (completed education)    Recommendations Diet recommendations: Nectar-thick liquid;Regular Liquids provided via: Cup;No straw Medication Administration: Crushed with puree Supervision: Full supervision/cueing for compensatory strategies;Trained caregiver to  feed patient Compensations: Slow rate;Small sips/bites Postural Changes and/or Swallow Maneuvers: Seated upright 90 degrees              Oral Care Recommendations: Oral care BID Follow up Recommendations: Skilled Nursing facility;24 hour supervision/assistance Plan: Discharge SLP treatment due to (comment) (completed education)    GO    Riccardo Dubin, Student-SLP  Riccardo Dubin 11/11/2014, 10:34 AM

## 2014-11-11 NOTE — Discharge Instructions (Signed)
Patient to continue dysphagia 3 diet with nectar thick liquid - Please consult palliative care service through hospice and palliative care of [facility].

## 2014-11-11 NOTE — Consult Note (Addendum)
Consultation Note Date: 11/11/2014   Patient Name: Ricky Rodriguez  DOB: 1940-10-03  MRN: [id number]  Age / Sex: 74 y.o., male   PCP: Garlan Fair, MD Referring Physician: Albertine Patricia, MD  Reason for Consultation: Establishing goals of care  Palliative Care Assessment and Plan Summary of Established Goals of Care and Medical Treatment Preferences   Clinical Assessment/Narrative: I met with the patient and his wife to discuss goals of care moving forward. He is largely nonverbal and much of conversation was had with his wife. We did discuss further with him at the end of conversation our plan moving forward and he reports he was in agreement.  We began a discussion by talking about what is most important to her husband. She reports prior to an accident in 2011 he was very active. Following this his condition is continuing to decline. We discussed at length her understanding of his current illness including recurrent aspiration and the fact that this will continue be a problem moving forward. She reports that her main concerns at this point and time is seeing how her husband does the short-term, making sure that there is a focus on his comfort, and working on a plan to get him back to home if possible as this is where he would want to be.  We discussed at length what various paths back to home may look like including course of rehabilitation followed by no changes in current care at home, transitioning home with hospice care and the intent of not coming back to the hospital, SNF for rehabilitation prior to going home like with hospice support, or evaluation for transition to residential hospice facility and plan to bring as much of home to the patient as possible with presence of family and favorite personal belongings.  She did discuss with Erling Conte from [facility] and he does not appear to be a candidate for placement in [facility] at this time.  We discussed various options for  his care and she is in agreement that a trial of a short stay at rehabilitation to see if he improves prior to going home would be best plan moving forward. She does state if he does not show continued improvement over the next several days that her plan will be to work on transferring him home with hospice support. If his condition would decline in the meantime, her plan would be to look at having him placed at [facility] for end-of-life care.   - Recommendation for discharge to SNF for rehabilitation with palliative care follow-up. I believe there is a high likelihood he will not be able to participate in rehabilitation and will end up transitioning either home or to [facility] with hospice support in the near future.   Contacts/Participants in Discussion: Primary Decision Maker: Patient's wife HCPOA: Yes   Code Status/Advance Care Planning:  DO NOT RESUSCITATE  Symptom Management:   Agitation: Continue Abilify  Seizures: Continue Keppra. Ativan as needed.  Palliative Prophylaxis: Recommend bowel regimen on discharge  Additional Recommendations (Limitations, Scope, Preferences): The patient's wife is interested in time limited trial of rehabilitation. If he does not improve, she wants to work on taking him home with hospice support versus transfer to [facility]. She is not interested rehospitalizing patient.  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 Patient was brought to the hospital via EMS as his wife noted right-sided facial droop and difficulty ambulation. Palliative consulted for  goals of care moving forward.  Primary Diagnoses  Present on Admission:  . Fever . CAP (community acquired pneumonia) . Elevated troponin . Acute renal injury . Aspiration pneumonia  Palliative Review of Systems: Patient with difficulty with verbalization. Denies complaints. I have reviewed the medical record, interviewed the patient and family, and examined the patient. The following aspects are pertinent.  Past Medical History  Diagnosis Date  . Dementia   . History of blood clots   . Acid reflux   . A-fib   . Shortness of breath    Social History   Social History  . Marital Status: Married    Spouse Name: N/A  . Number of Children: N/A  . Years of Education: N/A   Social History Main Topics  . Smoking status: Never Smoker   . Smokeless tobacco: Never Used  . Alcohol Use: 0.6 oz/week    1 Glasses of wine per week     Comment: occasional  . Drug Use: No  . Sexual Activity: Not Asked   Other Topics Concern  . None   Social History Narrative   No family history on file. Scheduled Meds: . ARIPiprazole  5 mg Oral Q breakfast  . aspirin EC  81 mg Oral Daily  . enoxaparin (LOVENOX) injection  40 mg Subcutaneous Q24H  . levETIRAcetam  500 mg Intravenous Q12H  . nystatin  5 mL Oral QID  . pantoprazole  40 mg Oral Daily  . piperacillin-tazobactam (ZOSYN)  IV  3.375 g Intravenous 3 times per day  . potassium chloride SA  60 mEq Oral Daily   Continuous Infusions:  PRN Meds:.acetaminophen, albuterol, chlorpheniramine-HYDROcodone, LORazepam, RESOURCE THICKENUP CLEAR Medications Prior to Admission:  Prior to Admission medications   Medication Sig Start Date End Date Taking? Authorizing Provider  acetaminophen (TYLENOL) 500 MG tablet Take 500 mg by mouth every 6 (six) hours as needed for mild pain, moderate pain or headache.   Yes Historical Provider, MD  ARIPiprazole (ABILIFY) 5 MG tablet Take 5 mg by mouth daily.   Yes Historical Provider, MD  aspirin EC 81 MG tablet  Take 81 mg by mouth at bedtime.    Yes Historical Provider, MD  cephALEXin (KEFLEX) 500 MG capsule take 1 capsule by mouth three times a day Patient taking differently: take 1 capsule by mouth two times a day 04/28/14  Yes Michel Bickers, MD  hydrochlorothiazide (HYDRODIURIL) 50 MG tablet Take 50 mg by mouth 2 (two) times daily.    Yes Historical Provider, MD  ibuprofen (ADVIL,MOTRIN) 200 MG tablet Take 200 mg by mouth every 6 (six) hours as needed for headache, mild pain or moderate pain.    Yes Historical Provider, MD  lansoprazole (PREVACID) 30 MG capsule Take 30 mg by mouth every morning.    Yes Historical Provider, MD  multivitamin (ONE-A-DAY MEN'S) TABS tablet Take 1 tablet by mouth daily.   Yes Historical Provider, MD  potassium chloride (KLOR-CON) 8 MEQ tablet Take 8 mEq by mouth daily.   Yes Historical Provider, MD  albuterol (PROVENTIL) (2.5 MG/3ML) 0.083% nebulizer solution Take 3 mLs (2.5 mg total) by nebulization every 4 (four) hours as needed for wheezing or shortness of breath.  11/11/14   Silver Huguenin Elgergawy, MD  chlorpheniramine-HYDROcodone (TUSSIONEX) 10-8 MG/5ML SUER Take 5 mLs by mouth every 12 (twelve) hours as needed for cough. 11/11/14   Silver Huguenin Elgergawy, MD  levETIRAcetam (KEPPRA) 500 MG tablet Take 1 tablet (500 mg total) by mouth 2 (two) times daily. 11/11/14   Silver Huguenin Elgergawy, MD  LORazepam (ATIVAN) 2 MG/ML concentrated solution Take 0.5 mLs (1 mg total) by mouth every 6 (six) hours as needed for anxiety or seizure (dyspnea). 11/11/14   Silver Huguenin Elgergawy, MD  potassium chloride SA (K-DUR,KLOR-CON) 20 MEQ tablet Take 3 tablets (60 mEq total) by mouth daily. 11/11/14   Silver Huguenin Elgergawy, MD   No Known Allergies CBC:    Component Value Date/Time   WBC 11.8* 11/11/2014 0501   HGB 12.7* 11/11/2014 0501   HCT 37.8* 11/11/2014 0501   PLT 385 11/11/2014 0501   MCV 92.2 11/11/2014 0501   NEUTROABS 16.8* 11/03/2014 1050   LYMPHSABS 0.9 11/03/2014 1050   MONOABS 2.0* 11/03/2014  1050   EOSABS 0.0 11/03/2014 1050   BASOSABS 0.0 11/03/2014 1050   Comprehensive Metabolic Panel:    Component Value Date/Time   NA 134* 11/11/2014 0501   K 4.0 11/11/2014 0501   CL 104 11/11/2014 0501   CO2 22 11/11/2014 0501   BUN 10 11/11/2014 0501   CREATININE 0.90 11/11/2014 0501   CREATININE 1.00 10/12/2014 1505   GLUCOSE 116* 11/11/2014 0501   CALCIUM 8.0* 11/11/2014 0501   AST 38 11/05/2014 0524   ALT 62 11/05/2014 0524   ALKPHOS 69 11/05/2014 0524   BILITOT 1.0 11/05/2014 0524   PROT 5.2* 11/05/2014 0524   ALBUMIN 2.6* 11/05/2014 0524    Physical Exam: Vital Signs: BP 126/74 mmHg  Pulse 86  Temp(Src) 98.6 F (37 C) (Oral)  Resp 18  Ht _0  (1.778 m)  Wt 84.46 kg (186 lb 3.2 oz)  BMI 26.72 kg/m2  SpO2 98% SpO2: SpO2: 98 % O2 Device: O2 Device: Not Delivered O2 Flow Rate:   Intake/output summary:  Intake/Output Summary (Last 24 hours) at 11/11/14 1814 Last data filed at 11/11/14 1700  Gross per 24 hour  Intake    500 ml  Output   1125 ml  Net   -625 ml   LBM: Last BM Date: 11/09/14 Baseline Weight: Weight: 77.111 kg (170 lb) Most recent weight: Weight: 84.46 kg (186 lb 3.2 oz)  Exam Findings:  General: Alert, awake, in no acute distress. Limited verbalization due to aphasia HEENT: No bruits, no goiter, no JVD Heart: Regular rate and rhythm. No murmur appreciated. Lungs: Good air movement, clear Abdomen: Soft, nontender, nondistended, positive bowel sounds.  Ext: Lower extremity edema Skin: Warm and dry         Palliative Performance Scale: 50               Additional Data Reviewed: Recent Labs     11/09/14  0442  11/10/14  0951  11/11/14  0501  WBC  12.4*  11.5*  11.8*  HGB  12.5*  13.7  12.7*  PLT  282  369  385  NA  133*   --   134*  BUN  11   --   10  CREATININE  0.91   --   0.90     Time In: 1000 Time Out: 1120 Time Total: 80 Greater than 50%  of this time was spent counseling and coordinating care related to the above  assessment and plan.  Signed by: Micheline Rough, MD  Micheline Rough, MD  11/11/2014, 6:14 PM  Please contact Palliative Medicine Team phone at [phone] for questions and concerns.

## 2014-11-11 NOTE — Clinical Social Work Placement (Signed)
   CLINICAL SOCIAL WORK PLACEMENT  NOTE  Date:  11/11/2014  Patient Details  Name: Ricky Rodriguez MRN: [id number] Date of Birth: June 09, 1940  Clinical Social Work is seeking post-discharge placement for this patient at the Skilled  Nursing Facility level of care (*CSW will initial, date and re-position this form in  chart as items are completed):  Yes   Patient/family provided with [place] Clinical Social Work Department's list of facilities offering this level of care within the geographic area requested by the patient (or if unable, by the patient's family).  Yes   Patient/family informed of their freedom to choose among providers that offer the needed level of care, that participate in Medicare, Medicaid or managed care program needed by the patient, have an available bed and are willing to accept the patient.  Yes   Patient/family informed of [facility]'s ownership interest in [facility] and [facility], as well as of the fact that they are under no obligation to receive care at these facilities.  PASRR submitted to EDS on       PASRR number received on       Existing PASRR number confirmed on 11/08/14     FL2 transmitted to all facilities in geographic area requested by pt/family on 11/08/14     FL2 transmitted to all facilities within larger geographic area on       Patient informed that his/her managed care company has contracts with or will negotiate with certain facilities, including the following:   Joetta Manners received authorization from [place] for patient to have ST rehab at their facility.   11/09/14   Patient/family informed of bed offers received.  Patient chooses bed at  [facility]     Physician recommends and patient chooses bed at  [facility]    Patient to be transferred to  [facility] on  11/11/14.  Patient to be transferred to facility by  ambulance     Patient family notified on  11/11/14 of transfer.  Name of family member  notified:   Wife, Antwyne Pingree.     PHYSICIAN Please prepare priority discharge summary, including medications, Please sign DNR, Please prepare prescriptions, Please sign FL2     Additional Comment:    _______________________________________________ Cristobal Goldmann, LCSW 11/11/2014, 4:36 PM

## 2015-05-13 ENCOUNTER — Other Ambulatory Visit: Payer: Self-pay | Admitting: Internal Medicine

## 2015-05-13 DIAGNOSIS — T8452XA Infection and inflammatory reaction due to internal left hip prosthesis, initial encounter: Secondary | ICD-10-CM

## 2015-08-17 ENCOUNTER — Encounter (HOSPITAL_BASED_OUTPATIENT_CLINIC_OR_DEPARTMENT_OTHER): Payer: Managed Care, Other (non HMO) | Attending: Surgery

## 2015-08-17 DIAGNOSIS — L89892 Pressure ulcer of other site, stage 2: Secondary | ICD-10-CM | POA: Insufficient documentation

## 2015-08-17 DIAGNOSIS — I739 Peripheral vascular disease, unspecified: Secondary | ICD-10-CM | POA: Insufficient documentation

## 2015-08-17 DIAGNOSIS — M21372 Foot drop, left foot: Secondary | ICD-10-CM | POA: Insufficient documentation

## 2015-08-17 DIAGNOSIS — Z515 Encounter for palliative care: Secondary | ICD-10-CM | POA: Diagnosis not present

## 2015-08-17 DIAGNOSIS — Z96642 Presence of left artificial hip joint: Secondary | ICD-10-CM | POA: Insufficient documentation

## 2015-08-17 DIAGNOSIS — Z86718 Personal history of other venous thrombosis and embolism: Secondary | ICD-10-CM | POA: Diagnosis not present

## 2015-08-17 DIAGNOSIS — I10 Essential (primary) hypertension: Secondary | ICD-10-CM | POA: Insufficient documentation

## 2015-08-17 DIAGNOSIS — G40909 Epilepsy, unspecified, not intractable, without status epilepticus: Secondary | ICD-10-CM | POA: Diagnosis not present

## 2015-08-17 DIAGNOSIS — L89623 Pressure ulcer of left heel, stage 3: Secondary | ICD-10-CM | POA: Insufficient documentation

## 2015-08-17 DIAGNOSIS — F0390 Unspecified dementia without behavioral disturbance: Secondary | ICD-10-CM | POA: Diagnosis not present

## 2015-09-01 ENCOUNTER — Encounter (HOSPITAL_BASED_OUTPATIENT_CLINIC_OR_DEPARTMENT_OTHER): Payer: Managed Care, Other (non HMO) | Attending: Surgery

## 2015-09-01 DIAGNOSIS — I10 Essential (primary) hypertension: Secondary | ICD-10-CM | POA: Diagnosis not present

## 2015-09-01 DIAGNOSIS — F0390 Unspecified dementia without behavioral disturbance: Secondary | ICD-10-CM | POA: Insufficient documentation

## 2015-09-01 DIAGNOSIS — Z86718 Personal history of other venous thrombosis and embolism: Secondary | ICD-10-CM | POA: Diagnosis not present

## 2015-09-01 DIAGNOSIS — L97521 Non-pressure chronic ulcer of other part of left foot limited to breakdown of skin: Secondary | ICD-10-CM | POA: Diagnosis not present

## 2015-09-01 DIAGNOSIS — L89622 Pressure ulcer of left heel, stage 2: Secondary | ICD-10-CM | POA: Diagnosis present

## 2015-09-01 DIAGNOSIS — I739 Peripheral vascular disease, unspecified: Secondary | ICD-10-CM | POA: Diagnosis not present

## 2015-09-01 DIAGNOSIS — Z96642 Presence of left artificial hip joint: Secondary | ICD-10-CM | POA: Diagnosis not present

## 2015-09-14 DIAGNOSIS — L89622 Pressure ulcer of left heel, stage 2: Secondary | ICD-10-CM | POA: Diagnosis not present

## 2015-09-28 ENCOUNTER — Encounter (HOSPITAL_BASED_OUTPATIENT_CLINIC_OR_DEPARTMENT_OTHER): Payer: Managed Care, Other (non HMO) | Attending: Surgery

## 2015-09-28 DIAGNOSIS — Z86718 Personal history of other venous thrombosis and embolism: Secondary | ICD-10-CM | POA: Insufficient documentation

## 2015-09-28 DIAGNOSIS — Z96642 Presence of left artificial hip joint: Secondary | ICD-10-CM | POA: Diagnosis not present

## 2015-09-28 DIAGNOSIS — M21372 Foot drop, left foot: Secondary | ICD-10-CM | POA: Insufficient documentation

## 2015-09-28 DIAGNOSIS — I10 Essential (primary) hypertension: Secondary | ICD-10-CM | POA: Diagnosis not present

## 2015-09-28 DIAGNOSIS — G40909 Epilepsy, unspecified, not intractable, without status epilepticus: Secondary | ICD-10-CM | POA: Diagnosis not present

## 2015-09-28 DIAGNOSIS — L89623 Pressure ulcer of left heel, stage 3: Secondary | ICD-10-CM | POA: Insufficient documentation

## 2015-09-28 DIAGNOSIS — F0390 Unspecified dementia without behavioral disturbance: Secondary | ICD-10-CM | POA: Diagnosis not present

## 2015-09-28 DIAGNOSIS — I739 Peripheral vascular disease, unspecified: Secondary | ICD-10-CM | POA: Diagnosis not present

## 2015-09-28 DIAGNOSIS — Z515 Encounter for palliative care: Secondary | ICD-10-CM | POA: Insufficient documentation

## 2015-10-12 DIAGNOSIS — L89623 Pressure ulcer of left heel, stage 3: Secondary | ICD-10-CM | POA: Diagnosis not present

## 2015-10-26 DIAGNOSIS — L89623 Pressure ulcer of left heel, stage 3: Secondary | ICD-10-CM | POA: Diagnosis not present

## 2015-11-09 ENCOUNTER — Encounter (HOSPITAL_BASED_OUTPATIENT_CLINIC_OR_DEPARTMENT_OTHER): Payer: Managed Care, Other (non HMO) | Attending: Surgery

## 2015-11-09 DIAGNOSIS — Z96642 Presence of left artificial hip joint: Secondary | ICD-10-CM | POA: Diagnosis not present

## 2015-11-09 DIAGNOSIS — I10 Essential (primary) hypertension: Secondary | ICD-10-CM | POA: Diagnosis not present

## 2015-11-09 DIAGNOSIS — Z86718 Personal history of other venous thrombosis and embolism: Secondary | ICD-10-CM | POA: Diagnosis not present

## 2015-11-09 DIAGNOSIS — L89623 Pressure ulcer of left heel, stage 3: Secondary | ICD-10-CM | POA: Diagnosis not present

## 2015-11-23 DIAGNOSIS — L89623 Pressure ulcer of left heel, stage 3: Secondary | ICD-10-CM | POA: Diagnosis not present

## 2015-12-08 ENCOUNTER — Encounter (HOSPITAL_BASED_OUTPATIENT_CLINIC_OR_DEPARTMENT_OTHER): Payer: Managed Care, Other (non HMO) | Attending: Surgery

## 2015-12-08 DIAGNOSIS — Z86718 Personal history of other venous thrombosis and embolism: Secondary | ICD-10-CM | POA: Insufficient documentation

## 2015-12-08 DIAGNOSIS — I10 Essential (primary) hypertension: Secondary | ICD-10-CM | POA: Insufficient documentation

## 2015-12-08 DIAGNOSIS — L89623 Pressure ulcer of left heel, stage 3: Secondary | ICD-10-CM | POA: Insufficient documentation

## 2015-12-08 DIAGNOSIS — Z96642 Presence of left artificial hip joint: Secondary | ICD-10-CM | POA: Insufficient documentation

## 2015-12-08 DIAGNOSIS — M170 Bilateral primary osteoarthritis of knee: Secondary | ICD-10-CM | POA: Insufficient documentation

## 2015-12-21 DIAGNOSIS — L89623 Pressure ulcer of left heel, stage 3: Secondary | ICD-10-CM | POA: Diagnosis not present

## 2016-01-04 ENCOUNTER — Encounter (HOSPITAL_BASED_OUTPATIENT_CLINIC_OR_DEPARTMENT_OTHER): Payer: Managed Care, Other (non HMO) | Attending: Surgery

## 2016-01-04 DIAGNOSIS — L89623 Pressure ulcer of left heel, stage 3: Secondary | ICD-10-CM | POA: Insufficient documentation

## 2016-01-12 DIAGNOSIS — L89623 Pressure ulcer of left heel, stage 3: Secondary | ICD-10-CM | POA: Diagnosis present

## 2016-02-02 ENCOUNTER — Encounter (HOSPITAL_BASED_OUTPATIENT_CLINIC_OR_DEPARTMENT_OTHER): Payer: Medicare Other

## 2016-02-28 DEATH — deceased
